# Patient Record
Sex: Female | Born: 1950
Health system: Southern US, Community
[De-identification: ages and names within clinical notes are randomized; demographics above are authoritative.]

## PROBLEM LIST (undated history)

## (undated) DIAGNOSIS — G4700 Insomnia, unspecified: Secondary | ICD-10-CM

## (undated) DIAGNOSIS — Z9981 Dependence on supplemental oxygen: Secondary | ICD-10-CM

## (undated) DIAGNOSIS — S14105A Unspecified injury at C5 level of cervical spinal cord, initial encounter: Secondary | ICD-10-CM

## (undated) DIAGNOSIS — J189 Pneumonia, unspecified organism: Secondary | ICD-10-CM

## (undated) DIAGNOSIS — G8250 Quadriplegia, unspecified: Secondary | ICD-10-CM

## (undated) DIAGNOSIS — L8990 Pressure ulcer of unspecified site, unspecified stage: Secondary | ICD-10-CM

## (undated) DIAGNOSIS — J9601 Acute respiratory failure with hypoxia: Secondary | ICD-10-CM

## (undated) DIAGNOSIS — F419 Anxiety disorder, unspecified: Secondary | ICD-10-CM

## (undated) HISTORY — PX: POSTERIOR FUSION CERVICAL SPINE: SUR628

## (undated) HISTORY — PX: CHOLECYSTECTOMY: SHX55

## (undated) HISTORY — PX: TONSILLECTOMY: SUR1361

## (undated) HISTORY — PX: VAGINAL HYSTERECTOMY: SUR661

## (undated) HISTORY — DX: Insomnia, unspecified: G47.00

## (undated) HISTORY — DX: Anxiety disorder, unspecified: F41.9

---

## 1984-06-22 DIAGNOSIS — S14105A Unspecified injury at C5 level of cervical spinal cord, initial encounter: Secondary | ICD-10-CM

## 1984-06-22 DIAGNOSIS — G8250 Quadriplegia, unspecified: Secondary | ICD-10-CM

## 1984-06-22 HISTORY — DX: Quadriplegia, unspecified: G82.50

## 1984-06-22 HISTORY — DX: Unspecified injury at C5 level of cervical spinal cord, initial encounter: S14.105A

## 2001-04-25 ENCOUNTER — Other Ambulatory Visit: Admission: RE | Admit: 2001-04-25 | Discharge: 2001-04-25 | Payer: Self-pay | Admitting: Dermatology

## 2003-02-27 ENCOUNTER — Inpatient Hospital Stay (HOSPITAL_COMMUNITY): Admission: AD | Admit: 2003-02-27 | Discharge: 2003-03-09 | Payer: Self-pay | Admitting: Pulmonary Disease

## 2003-08-01 ENCOUNTER — Ambulatory Visit (HOSPITAL_COMMUNITY): Admission: RE | Admit: 2003-08-01 | Discharge: 2003-08-01 | Payer: Self-pay | Admitting: Pulmonary Disease

## 2004-02-01 ENCOUNTER — Ambulatory Visit (HOSPITAL_COMMUNITY): Admission: RE | Admit: 2004-02-01 | Discharge: 2004-02-01 | Payer: Self-pay | Admitting: Pulmonary Disease

## 2004-05-28 ENCOUNTER — Ambulatory Visit (HOSPITAL_COMMUNITY): Admission: RE | Admit: 2004-05-28 | Discharge: 2004-05-28 | Payer: Self-pay | Admitting: Pulmonary Disease

## 2004-12-26 ENCOUNTER — Ambulatory Visit (HOSPITAL_COMMUNITY): Admission: RE | Admit: 2004-12-26 | Discharge: 2004-12-26 | Payer: Self-pay | Admitting: Pulmonary Disease

## 2005-08-06 ENCOUNTER — Ambulatory Visit (HOSPITAL_COMMUNITY): Admission: RE | Admit: 2005-08-06 | Discharge: 2005-08-06 | Payer: Self-pay | Admitting: Pulmonary Disease

## 2005-11-30 ENCOUNTER — Ambulatory Visit (HOSPITAL_COMMUNITY): Admission: RE | Admit: 2005-11-30 | Discharge: 2005-11-30 | Payer: Self-pay | Admitting: Pulmonary Disease

## 2006-01-22 ENCOUNTER — Ambulatory Visit: Payer: Self-pay | Admitting: Internal Medicine

## 2006-01-22 ENCOUNTER — Ambulatory Visit (HOSPITAL_COMMUNITY): Admission: RE | Admit: 2006-01-22 | Discharge: 2006-01-22 | Payer: Self-pay | Admitting: Internal Medicine

## 2006-01-22 HISTORY — PX: COLONOSCOPY: SHX174

## 2007-03-18 ENCOUNTER — Inpatient Hospital Stay (HOSPITAL_COMMUNITY): Admission: AD | Admit: 2007-03-18 | Discharge: 2007-03-23 | Payer: Self-pay | Admitting: Pulmonary Disease

## 2010-03-26 ENCOUNTER — Encounter
Admission: RE | Admit: 2010-03-26 | Discharge: 2010-06-09 | Payer: Self-pay | Source: Home / Self Care | Attending: Pulmonary Disease | Admitting: Pulmonary Disease

## 2010-09-10 ENCOUNTER — Ambulatory Visit (HOSPITAL_COMMUNITY)
Admission: RE | Admit: 2010-09-10 | Discharge: 2010-09-10 | Disposition: A | Discharge status: Home / Self Care | Payer: BLUE CROSS/BLUE SHIELD | Source: Ambulatory Visit | Attending: Pulmonary Disease | Admitting: Pulmonary Disease

## 2010-09-10 DIAGNOSIS — IMO0001 Reserved for inherently not codable concepts without codable children: Secondary | ICD-10-CM | POA: Insufficient documentation

## 2010-09-10 DIAGNOSIS — G8250 Quadriplegia, unspecified: Secondary | ICD-10-CM | POA: Insufficient documentation

## 2010-09-10 DIAGNOSIS — M62838 Other muscle spasm: Secondary | ICD-10-CM | POA: Insufficient documentation

## 2010-11-04 NOTE — Discharge Summary (Signed)
NAME:  [name] Andersen, [name] Andersen                 ACCOUNT NO.:  [id number]   MEDICAL RECORD NO.:  [id number]          PATIENT TYPE:  INP   LOCATION:  A222                          FACILITY:  [facility]   PHYSICIAN:  Edward L. Juanetta Gosling, M.D.DATE OF BIRTH:  [birth year]   DATE OF ADMISSION:  03/18/2007  DATE OF DISCHARGE:  10/01/2008LH                               DISCHARGE SUMMARY   FINAL DISCHARGE DIAGNOSES:  1. Acute bronchitis.  2. Respiratory failure, multifactorial.  3. Quadriparesis related to a C-spine injury.  4. Muscle spasm related to her C-spine injury.  5. Decubitus on the sacral area.   HISTORY:  [name] Andersen is a 60 year old who was in her usual state of fair  health at home when she developed cough and congestion 24 hours prior to  admission.  She only lives about 2 houses from my office, and because it  is difficult for her to get up, I went to her home  and saw her. She was  clearly very congested and looked quite sick.  I was concerned that she  had pneumonia.  Her husband had tried some vibratory percussion which  had not helped, so I directly admitted her to the hospital at that  point.  She has had worsening congestion, and normally she can get up  and move in her wheelchair with help, but today she has not been able to  get out of the bed.   PAST MEDICAL HISTORY:  Is positive for a cervical spine injury that has  left her with a quadriparesis. She is able to use her arms some. She has  a motorized wheelchair and does fairly well at home and has 24-hour  care.   PHYSICAL EXAMINATION:  GENERAL:  A thin female who had  bubbling  respirations even after a nebulizer treatment, marked wheezing, and she  looked quite sick.  She was very weak and thin.  HEENT:  Mucous membranes were moist.  Pupils were reactive.  Nose and  throat were clear.  CHEST:  Showed rhonchi and rales.  HEART:  Regular without a gallop.  EXTREMITIES:  Showed no edema, but she did have a decubitus on her   sacrum.   White count was 8500 with 83% neutrophils, platelets 129, hemoglobin  12.9. Her blood gas showed a pH of 7.31, pCO2 of 56, pO2 of 195. This on  100%. Her blood cultures negative.   Chest x-ray did not show pneumonia.   HOSPITAL COURSE:  She was admitted, felt to have severe bronchitis,  respiratory failure, multifactorial, probably mostly from her muscle  weakness and lack of diaphragmatic function.  She did have a decubitus  ulcer as well.   She was started on Xopenex. Levaquin. Mucinex. and slowly improved.  By  the time of discharge. she was improved enough for discharge home,  although she still had some congestion.   She is discharged home on:  1. Mucinex 2 tablets twice a day.  2. Levaquin 500 mg daily x7 more days.  3. Temazepam 15 mg at bedtime with a repeat if needed.  4. Diazepam 5  mg b.i.d.  5. Gabapentin 600 mg three times a day.  6. Baclofen 20 mg 2 tablets three times a day.  7. Xanax on a p.r.n. basis.  8. She is going to have a nebulizer with Xopenex, low-dose, three      times a day at home, and I will have home health see her as well.  9. Her O2 saturation was 81% on room air today, so she is going to be      on some oxygen as well.  10.She will also be on Tussionex 5 mL q. 12 h p.r.n. cough.   I will also have home health help her with her sacral decubitus.      Edward L. Juanetta Gosling, M.D.  Electronically Signed     ELH/MEDQ  D:  03/23/2007  T:  03/23/2007  Job:  [id number]

## 2010-11-04 NOTE — Group Therapy Note (Signed)
NAME:  [name] Andersen, [name] Andersen                 ACCOUNT NO.:  [id number]   MEDICAL RECORD NO.:  [id number]          PATIENT TYPE:  INP   LOCATION:  A222                          FACILITY:  [facility]   PHYSICIAN:  Edward L. Juanetta Gosling, M.D.DATE OF BIRTH:  [birth year]   DATE OF PROCEDURE:  03/20/2007  DATE OF DISCHARGE:                                 PROGRESS NOTE   [name] seems to be better than she was.  She has no new complaints.  She says that her chest is clearer but still very congested.   Her exam shows temperature is 99.8, pulse 110, respirations 22, blood  pressure 95, O2 saturation 91% on 3 liters.  She says she thinks she has  a new skin lesion. I cannot tell if this is new, but it does have a  DuoDerm over it, and I did not remove the DuoDerm at this point since it  has just been placed, and I am afraid I am going to tear her skin.  Her  chest shows significant rhonchi.  Her heart is regular.  Her abdomen is  soft.   ASSESSMENT:  She seems to be better, still sick, of course.   PLAN:  Continue her medications. No changes in her treatment.  She says  is not eating well.  I told her she could have food from home.      Edward L. Juanetta Gosling, M.D.  Electronically Signed     ELH/MEDQ  D:  03/20/2007  T:  03/20/2007  Job:  [id number]

## 2010-11-04 NOTE — Group Therapy Note (Signed)
NAME:  [name] Andersen, [name] Andersen                 ACCOUNT NO.:  [id number]   MEDICAL RECORD NO.:  [id number]          PATIENT TYPE:  INP   LOCATION:  A222                          FACILITY:  [facility]   PHYSICIAN:  Edward L. Juanetta Gosling, M.D.DATE OF BIRTH:  09-08-1950   DATE OF PROCEDURE:  03/23/2007  DATE OF DISCHARGE:                                 PROGRESS NOTE   [name] Andersen says she had a bad night last night with not being able to  sleep, problems with tightening of medications, etc.  Otherwise, she  feels better as far as her chest is concerned, and she wants to go home.   Her physical exam shows that her temperature is 97, pulse 101,  respirations 22, blood pressure 99/63, O2 saturation 93%. She, of  course, has limited use for arms and is paralyzed from about the arms  down.  She did have a bowel movement yesterday.  She is not very hungry  but otherwise I think is better.  Her chest is clearer; it is not clear.   ASSESSMENT:  She is better.   PLAN:  I am hopeful she will be able to be discharged later today.  I am  going to go ahead and have her get an O2 saturation on room air to make  sure she is okay with that. She is going to need a nebulizer. She will  need home health services.  I am going to get her a dose of Levaquin  before she goes.  Please see Discharge Summary for details.      Edward L. Juanetta Gosling, M.D.  Electronically Signed     ELH/MEDQ  D:  03/23/2007  T:  03/23/2007  Job:  [id number]

## 2010-11-04 NOTE — H&P (Signed)
NAME:  [name] Andersen, CASASOLA                 ACCOUNT NO.:  [id number]   MEDICAL RECORD NO.:  [id number]          PATIENT TYPE:  INP   LOCATION:  A222                          FACILITY:  [facility]   PHYSICIAN:  Edward L. Juanetta Gosling, M.D.DATE OF BIRTH:  08-Apr-1951   DATE OF ADMISSION:  03/18/2007  DATE OF DISCHARGE:  [facility]                              HISTORY & PHYSICAL   REASON FOR ADMISSION:  Shortness of breath.   HISTORY:  Ms. Schussler is a 60 year old who had developed cough and  congestion about 24 hours ago.  She lives about two houses from my  office so I went by to see her at her home, and she was clearly very  congested.  Her husband had tried some vibropercussion which has not  helped.  She has not had any definite fever but she may have fever.  She  has gotten increasingly weak and has had worsening congestion today.  Because of her severe congestion and the fact that she has  quadriparesis, although she has some use of her arms,  she was admitted  the hospital.   Her past medical history is positive for cholecystectomy, a cervical  spine injury that has left her with quadriparesis.  She is able to use  her arms some.  She is generally in a wheelchair.  She has a motorized  wheelchair and does fairly well at home.  She does have 24-hour care.   FAMILY HISTORY:  Is positive for pancreatic cancer and vascular disease  as well as history of depression.   SOCIAL HISTORY:  She lives with her husband.  She does not smoke, does  not use any illicit drugs, does not use any alcohol to any great extent.   REVIEW OF SYSTEMS:  She denies any problems with her urine right now.  She does have difficulty with sleep.  She has spasms frequently and she  has had a large area of essentially a decubitus on her sacral area.   PHYSICAL EXAMINATION NOW:  Shows a thin female who has bubbling  respirations, even after a nebulizer treatment.  Her temperature is  98.5, pulse 81, respirations 16, blood pressure  112/75.  She looks very  weak.  She is very thin.  Her mucous membranes are moist.  Her pupils do  react.  Her nose and throat are clear.  She is 64 inches tall height,  weight 41.6 kg.  CHEST:  Shows marked rhonchi, some rales.  HEART:  Regular without gallop.  ABDOMEN:  Soft.  Bowel sounds are present and active.  EXTREMITIES:  Showed no edema. She does have a decubitus on her sacrum.  She has some wasting of the extremities.  She has about a C5 or 6 level  sensation.   LABORATORY WORK:  White count is 8500 with 83% neutrophils, platelets  129, hemoglobin is 12.9.  On 100% O2, pH 7.31, pCO2 of 56, pO2 195.  She  is now on a 50% mask.  Her electrolytes are normal.  BUN 11, creatinine  0.32, glucose 114, potassium is 3.8, albumin 3.5.  Blood cultures,  of  course, are pending.  Her chest x-ray does not show pneumonia, which is  somewhat surprising to me.  When I saw her at her home I certainly  thought she had pneumonia.   ASSESSMENT:  She has severe bronchitis.  She has some respiratory  failure.  I am not sure how much of this is chronic.  She has a sacral  decubitus.  She has quadriparesis.   Plan is to go ahead and admit her, start her on Levaquin, put in a Foley  catheter, give her some fluids and Xopenex treatments for her  secretions, Mucinex for secretions.  She may have to have some Mucomyst  added.  The Xopenex I have put on the low dose because she has very  little tolerance for these.  We will continue with her treatments and  follow from there.      Edward L. Juanetta Gosling, M.D.  Electronically Signed     ELH/MEDQ  D:  03/18/2007  T:  03/19/2007  Job:  [id number]

## 2010-11-04 NOTE — Group Therapy Note (Signed)
NAME:  [name] Andersen, [name] Andersen                 ACCOUNT NO.:  [id number]   MEDICAL RECORD NO.:  [id number]          PATIENT TYPE:  INP   LOCATION:  A222                          FACILITY:  [facility]   PHYSICIAN:  Edward L. Juanetta Gosling, M.D.DATE OF BIRTH:  01-27-1951   DATE OF PROCEDURE:  DATE OF DISCHARGE:                                 PROGRESS NOTE   PROBLEM:  Bronchitis, quadriparesis.   SUBJECTIVE:  [name] Andersen says she is doing okay.  She needs to have a  bowel movement today and we are going to give her suppository.  At home,  she and her husband use a digital stimulation.  I think the suppository  will be about the same.  She is feeling better but still very congested.   EXAM:  Shows temperature is 97.7, pulse 106, respirations 18, blood  pressure 85/50, O2 sats 94% on 4L.  Her chest is clearing.  She still has some rhonchi.  Her heart is regular.   ASSESSMENT:  She is better.   PLAN:  Continue her treatments probably another 24 hours.  I am hopeful  that she will be able to be discharged maybe tomorrow.  We will plan to  continue with other treatments and follow.      Edward L. Juanetta Gosling, M.D.  Electronically Signed     ELH/MEDQ  D:  03/22/2007  T:  03/22/2007  Job:  [id number]
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
## 2010-11-07 NOTE — Discharge Summary (Signed)
   NAME:  [name] Andersen, [name] Andersen                           ACCOUNT NO.:  [id number]   MEDICAL RECORD NO.:  [id number]                   PATIENT TYPE:  INP   LOCATION:  A338                                 FACILITY:  [facility]   PHYSICIAN:  Edward L. Juanetta Gosling, M.D.             DATE OF BIRTH:  [birth year]   DATE OF ADMISSION:  02/27/2003  DATE OF DISCHARGE:                                 DISCHARGE SUMMARY   FINAL DISCHARGE DIAGNOSES:  1. Pneumonia.  2. Quadriplegia from cervical spine injury.  3. Perineal yeast infection.   HISTORY:  Ms. Lover is a 60 year old who had a cervical spine injury some  years ago and who now has limited use of her arms and poor diaphragmatic  function.  She is wheelchair bound.  She was in her usual state of fairly  poor health at home when she developed a cough a congestion, which  progressed despite treatment.  She came to my office on the day of admission  and clearly was not moving her secretions well and was admitted to the  hospital because of that.  Her physical examination showed that her blood  pressure was about 80/60, pulse 100.  She had blood cultures which were  negative.  She had a normal white blood cell count.  Chest x-ray showed what  appeared to be a pneumonia.   HOSPITAL COURSE:  She was treated with intravenous antibiotics and improved  slowly.  She had difficulty moving her secretions.  She was placed on BiPAP,  which helped, and then had productive cough.  She continued her slow  improvement and by the time she was ready for discharge, her chest was much  clearer.   DISCHARGE MEDICATIONS:  She is discharged home on:  1. Ceftin 250 mg b.i.d. x7 more days.  2. Diflucan 100 mg daily for 10 days.  3. Humibid LA 1 b.i.d.  4. Nebulizer treatments with Xopenex and Atrovent and she is also going to     have a manual percussion device at home.      ___________________________________________                                            Oneal Deputy.  Juanetta Gosling, M.D.   ELH/MEDQ  D:  03/09/2003  T:  03/10/2003  Job:  [id number]

## 2010-11-07 NOTE — Group Therapy Note (Signed)
   NAME:  [name] Andersen, MENTOR                           ACCOUNT NO.:  [id number]   MEDICAL RECORD NO.:  [id number]                   PATIENT TYPE:  INP   LOCATION:  A223                                 FACILITY:  [facility]   PHYSICIAN:  Edward L. Juanetta Gosling, M.D.             DATE OF BIRTH:  January 10, 1951   DATE OF PROCEDURE:  03/05/2003  DATE OF DISCHARGE:                                   PROGRESS NOTE   PROBLEM:  Pneumonia, quadriplegia.   SUBJECTIVE:  [name] says that she is feeling a little better.  She is  tolerating the BiPAP fairly well so far.  She still has a lot of congestion  in her chest, but it is no as bad as it has been.  She has no other new  complaints now.   PHYSICAL EXAMINATION:  VITAL SIGNS:  Blood pressure 95/54, pulse 72,  respirations 22, temperature 97.7.  CHEST:  Clearer than it has been.  HEART:  Regular.   ASSESSMENT:  She has pneumonia and quadriplegia.  She has had low blood  pressures and low pulse rates.  This is due to the phenomenon known as  dysreflexia that occurs in patients with quadriplegia.  She is slowly  improving, but it clearly no ready for discharge yet.  I will plan to  continue with fluid, antibiotics, etc.  No changes today.                                                 Edward L. Juanetta Gosling, M.D.    ELH/MEDQ  D:  03/05/2003  T:  03/06/2003  Job:  [id number]

## 2010-11-07 NOTE — Group Therapy Note (Signed)
   NAME:  [name] Andersen, [name] Andersen                           ACCOUNT NO.:  [id number]   MEDICAL RECORD NO.:  [id number]                   PATIENT TYPE:  INP   LOCATION:  A223                                 FACILITY:  [facility]   PHYSICIAN:  Edward L. Juanetta Gosling, M.D.             DATE OF BIRTH:  04/21/1951   DATE OF PROCEDURE:  DATE OF DISCHARGE:                                   PROGRESS NOTE   PROBLEMS:  Pneumonia, quadriplegia.   SUBJECTIVE:  [name] Andersen says that she is feeling okay.  She had a pretty  good night last night but did wake up covered with sweat which sometimes is  a sign that she is having some other problem.  This has been investigated.  However, we really did not find anything.  She is otherwise doing okay.  Says that she is breathing better.  She is able to cough up some sputum now.   OBJECTIVE:  VITAL SIGNS:  Temperature 97.3, pulse 89, respirations 20, blood  pressure 90/60, O2 saturation 97% on room air.  CHEST:  Much clearer than it has been.  HEART:  Regular.  EXTREMITIES:  Show no edema.   ASSESSMENT:  She is improving.   PLAN:  Get her up and moving around a little more.  Her IV is out, so she is  on p.o. medications at this point.                                               Edward L. Juanetta Gosling, M.D.    ELH/MEDQ  D:  03/07/2003  T:  03/07/2003  Job:  [id number]

## 2010-11-07 NOTE — Group Therapy Note (Signed)
   NAME:  [name] Andersen, [name] Andersen                           ACCOUNT NO.:  [id number]   MEDICAL RECORD NO.:  [id number]                   PATIENT TYPE:  INP   LOCATION:  A223                                 FACILITY:  [facility]   PHYSICIAN:  Edward L. Juanetta Gosling, M.D.             DATE OF BIRTH:  [birth year]   DATE OF PROCEDURE:  03/06/2003  DATE OF DISCHARGE:                                   PROGRESS NOTE   PROBLEM LIST:  1. Pneumonia.  2. Quadriplegia.   SUBJECTIVE:  [name] says she is doing okay and has no new complaints.  She does feel like the BiPAP mask does not fit her well and she is not going  to be able to tolerate it if we cannot find another mask.  She sat up for  awhile yesterday and probably had some dysautonomia with feeling of low  blood pressure so I am going to ask her to get a little bit more when her  husband is around because we want to try and stabilize her autonomic nervous  system the best we can.  Otherwise, she says she is breathing better.  She  did cough up some sputum today.   OBJECTIVE:  VITAL SIGNS:  Temperature 97.7, pulse 81, respirations 22, blood  pressure 93/64, O2 saturations 95% on 2 L.  CHEST:  Shows rhonchi bilaterally, but is clearer than it has been.   ASSESSMENT:  She is slowly improving.   PLAN:  Continue with treatments.  We are going to see if we can get another  mask that might fit her better.  If not, I think we will just discontinue  the BiPAP since she is not tolerating it and she is improving.                                               Edward L. Juanetta Gosling, M.D.    ELH/MEDQ  D:  03/06/2003  T:  03/06/2003  Job:  [id number]

## 2010-11-07 NOTE — Group Therapy Note (Signed)
   NAME:  [name] Andersen, [name] Andersen                           ACCOUNT NO.:  [id number]   MEDICAL RECORD NO.:  [id number]                   PATIENT TYPE:  INP   LOCATION:  A223                                 FACILITY:  [facility]   PHYSICIAN:  Edward L. Juanetta Gosling, M.D.             DATE OF BIRTH:  [birth year]   DATE OF PROCEDURE:  03/01/2003  DATE OF DISCHARGE:                                   PROGRESS NOTE   SUBJECTIVE:  [name] says that she had an episode of feeling anxious last  night; it is not really clear exactly what happened.  She is feeling a  little better this morning.  She never has coughed anything up but says that  she is overall better.   OBJECTIVE:  Her exam today shows that her temperature is 99.3, pulse 60,  respirations 20, blood pressure 99/52, O2 saturation 93% now on 3 liters.  Her chest still shows some rhonchi bilaterally but clearer than it was, I&O  is -100.  Her IV came out last night.  I would like her to get another at  least 24 hours of IV antibiotics and preferably be able to give her another  dose tomorrow.  I am going to ask the nursing staff to try one more time to  see if we can get intravenous access.                                               Edward L. Juanetta Gosling, M.D.    ELH/MEDQ  D:  03/01/2003  T:  03/01/2003  Job:  [id number]

## 2010-11-07 NOTE — Group Therapy Note (Signed)
   NAME:  [name] Andersen, [name]                           ACCOUNT NO.:  [id number]   MEDICAL RECORD NO.:  [id number]                   PATIENT TYPE:  INP   LOCATION:  A338                                 FACILITY:  [facility]   PHYSICIAN:  Edward L. Juanetta Gosling, M.D.             DATE OF BIRTH:  August 27, 1950   DATE OF PROCEDURE:  03/09/2003  DATE OF DISCHARGE:                                   PROGRESS NOTE   PROBLEM LIST:  1. Pneumonia.  2. Quadriplegia.  3. Perineal yeast infection.   SUBJECTIVE:  [name] Andersen is a 60 year old who had a cervical spine injury  some years ago and has been left quadriplegic since.  She does have some  limited use of her arms.  She was in her usual state of fair health at home  when she developed increasing shortness of breath; she developed a cough,  congestion, and thought she had a cold, requested treatment, and we gave her  an antibiotic and gave her a decongestant but she seemed to not tolerate  those particularly well and seemed to get worse.  She came to my office on  the day of admission and was clearly quite congested, not really able to  move her secretions, and she was admitted to the hospital because of this.  She has had a fairly stormy course through the hospitalization thus far but  seems to be doing much better and today says she feels like she is ready to  go home.   OBJECTIVE:  Her exam shows that she has a fairly marked perineal infection.  Her chest is much clearer, she is afebrile, and overall seems to have  improved.   ASSESSMENT:  She is better.   PLAN:  Discharge her home today.                                               Edward L. Juanetta Gosling, M.D.    ELH/MEDQ  D:  03/09/2003  T:  03/09/2003  Job:  [id number]
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
## 2010-11-07 NOTE — Op Note (Signed)
NAME:  [name] Andersen, [name] Andersen                 ACCOUNT NO.:  [id number]   MEDICAL RECORD NO.:  [id number]          PATIENT TYPE:  AMB   LOCATION:  DAY                           FACILITY:  [facility]   PHYSICIAN:  Lionel December, M.D.    DATE OF BIRTH:  19-Feb-1951   DATE OF PROCEDURE:  01/22/2006  DATE OF DISCHARGE:                                 OPERATIVE REPORT   PROCEDURE:  Colonoscopy.   INDICATIONS:  [name] is s 60 year old Caucasian female who is undergoing high-  risk screening colonoscopy.  Her mother had surgery for colon carcinoma at  age 66 or 71.  The patient has neurogenic bowel secondary to paraplegia.  She is using a suppository every 3 days but rarely has good evacuation.  The  procedure was reviewed with the patient, informed signed was obtained.   MEDS FOR CONSCIOUS SEDATION:  Demerol 20 mg IV, Versed 1 mg IV.   FINDINGS:  The procedure performed in endoscopy suite.  The patient's vital  signs and O2 sat were monitored during the procedure and remained stable.  The patient was placed in the left lateral decubitus position, rectal  examination performed.  The sphincter tone was normal.  Digital exam was  also normal.  The Olympus videoscope was placed in the rectum and advanced  under vision in the sigmoid colon and beyond.  There was some stool at the  rectosigmoid junction, otherwise the prep was excellent.  The scope was  easily passed through the hepatic flexure which was tortuous.  Using  abdominal pressure and different positions, I was able to advance the scope  into the ascending colon and cecum.  The cecum was identified by the  appendiceal orifice and ileocecal valve.  A short segment of TI was also  examined and was normal.  As the scope was withdrawn, the colonic mucosa was  examined for the second time and there were no polyps and/or tumor masses.  The rectal mucosa was normal.  The scope was retroflexed to examine the  anorectal junction and hemorrhoids noted below the  dentate line.  The  endoscope was withdrawn and withdrawn.  The patient tolerated the procedure  well.   FINAL DIAGNOSIS:  Normal colonoscopy except external hemorrhoids.   RECOMMENDATIONS:  She will resume her usual medicines and high-fiber diet.  I would like for her to try GlycoLax 17 grams the night before she takes a  suppository and hopefully it would improve her colonic evacuation.  She may  want to titrate the dose and hopefully can find the optimal dose.   She may consider next screening exam in 5 years from now.      Lionel December, M.D.  Electronically Signed    NR/MEDQ  D:  01/22/2006  T:  01/22/2006  Job:  [id number]

## 2010-11-07 NOTE — Group Therapy Note (Signed)
   NAME:  [name] Andersen, [name] Andersen                           ACCOUNT NO.:  [id number]   MEDICAL RECORD NO.:  [id number]                   PATIENT TYPE:  INP   LOCATION:  A223                                 FACILITY:  [facility]   PHYSICIAN:  Edward L. Juanetta Gosling, M.D.             DATE OF BIRTH:  26-Feb-1951   DATE OF PROCEDURE:  03/02/2003  DATE OF DISCHARGE:                                   PROGRESS NOTE   PROBLEMS:  1. Pneumonia.  2. Quadriplegia.   SUBJECTIVE:  [name] says that she had a bad night last night.  She has  felt agitated and had trouble breathing.  She has been refusing her  respiratory treatments and I have told her today that we need to go ahead  with those.  She is otherwise doing about the same.   OBJECTIVE:  Her exam this morning shows that she is very congested, much  more so even than on admission.  Her O2 saturation is in the 90s, however,  and her respiratory rate is about 20 but she is taking very shallow  respirations which is as always because of her quadriplegia.  I have  discussed this with she and her husband at some length and the plan now is  to add nebulizer treatments even though she has had some nausea and  agitation from those.  I am going to pretreat her with Xanax because of the  problems that she has had with the nausea.  I am going to go ahead with  Mucomyst, Xopenex, and Atrovent treatments and see if it makes a difference.  I am hopeful that she will be able to tolerate this.  I did discuss with the  family and with [name] that she may end up having to be intubated for  secretion control if for no other reason, but I do not want to do that  unless we have to.  I have discussed all this at length with her.                                               Edward L. Juanetta Gosling, M.D.    ELH/MEDQ  D:  03/02/2003  T:  03/02/2003  Job:  [id number]

## 2010-11-07 NOTE — H&P (Signed)
NAME:  CAROLLE, ISHII                           ACCOUNT NO.:  [id number]   MEDICAL RECORD NO.:  [id number]                   PATIENT TYPE:  INP   LOCATION:  A223                                 FACILITY:  [facility]   PHYSICIAN:  Edward L. Juanetta Gosling, M.D.             DATE OF BIRTH:  [birth year]   DATE OF ADMISSION:  02/27/2003  DATE OF DISCHARGE:                                HISTORY & PHYSICAL   REASON FOR ADMISSION:  Bronchial pneumonia.   HISTORY:  Ms. Poorman is a 60 year old who has developed a cough with  congestion about a week ago, was treated with Zithromax and a decongestant  and developed increasing problems.  She has become short of breath, she is  having difficulty lying down.  She is not really able to cough anything up.  Her husband has been attempting some vibropercussion which has not worked.  She says that she is still having a lot of congestion.  She has not had any  fever.   PAST MEDICAL HISTORY:  Positive for:  1. Cholecystectomy.  2. Five to seven years ago, she had a cervical spine injury that has left     her with quadriparesis with almost complete paralysis, with complete     paralysis of the legs and some use of her arms.  She is generally in a     wheelchair.   FAMILY HISTORY:  Positive for pancreatic cancer and vascular disease.   SOCIAL HISTORY:  She lives at home with her husband.  She does not smoke.  She does not drink any alcohol.   REVIEW OF SYSTEMS:  Otherwise essentially negative.   PHYSICAL EXAMINATION:  VITAL SIGNS:  Show her temperature is 99,  respirations 32, pulse 52, blood pressure 96/62.  GENERAL:  She appears dyspneic at rest.  HEENT:  Pupils are reactive to light and accomodation.  Nose and throat are  clear.  NECK:  Supple without masses.  She has had some sort of surgery on her neck  to stabilize her neck.  CHEST:  Shows decreased breath sounds and some rhonchi, fairly poor  respiratory excursion.  HEART:  Regular.  ABDOMEN:   Soft.  EXTREMITIES:  Showed no edema.  She has flaccid paralysis to the lower  extremities.   ASSESSMENT:  I think she has a bronchopneumonia.    PLAN:  Admit for  IV treatments.  I am concerned that she will have  increasing respiratory failure because of her inability to clear her  secretions, if we do not go ahead and get her in the hospital now.                                               Edward L. Juanetta Gosling, M.D.    ELH/MEDQ  D:  02/27/2003  T:  02/27/2003  Job:  [id number]

## 2010-11-07 NOTE — Group Therapy Note (Signed)
   NAME:  JAONNA, WORD                           ACCOUNT NO.:  [id number]   MEDICAL RECORD NO.:  [id number]                   PATIENT TYPE:  INP   LOCATION:  A223                                 FACILITY:  [facility]   PHYSICIAN:  Edward L. Juanetta Gosling, M.D.             DATE OF BIRTH:  [birth year]   DATE OF PROCEDURE:  03/08/2003  DATE OF DISCHARGE:                                   PROGRESS NOTE   PROBLEM LIST:  1. Pneumonia.  2. Quadriplegia.   SUBJECTIVE:  Ms. Odekirk is doing better in general.  She continues to be  somewhat short of breath and she has had a very slow recovery from this -  which is not unexpected considering her quadriplegia and minimal if any  diaphragmatic function.   OBJECTIVE:  Her physical exam today shows her temperature is 97.1, pulse 76,  respirations 18, blood pressure 104/62.  Chest is clearer.  Her heart is  regular.  She continues to have some episodes of what are probably a  dysautonomia or dysreflexia with drop in heart rate, drop in blood pressure,  but overall she seems to be doing better.  We discussed discharge planning  today.  She will need some home health and we are working all this out.  She  will probably be able to be discharged tomorrow.                                               Edward L. Juanetta Gosling, M.D.    ELH/MEDQ  D:  03/08/2003  T:  03/08/2003  Job:  [id number]

## 2010-11-07 NOTE — Group Therapy Note (Signed)
   NAME:  [name] Andersen, [name] Andersen                           ACCOUNT NO.:  [id number]   MEDICAL RECORD NO.:  [id number]                   PATIENT TYPE:  INP   LOCATION:  A223                                 FACILITY:  [facility]   PHYSICIAN:  Edward L. Juanetta Gosling, M.D.             DATE OF BIRTH:  01/05/1951   DATE OF PROCEDURE:  02/28/2003  DATE OF DISCHARGE:                                   PROGRESS NOTE   PROBLEM LIST:  Bronchopneumonia.   SUBJECTIVE:  [name] Andersen is still not really able to cough up anything.  She  had some problem with albuterol and a little problem, but less with Xopenex  causing her to be nauseated.  She says she does not feel as well as she  would like and is still shortness of breath.   OBJECTIVE:  VITAL SIGNS:  Temperature 96.9, pulse 74, respirations 22, blood  pressure 104/71, O2 saturations 90% on 2 L.  LUNGS:  Her respiratory effort is fairly poor.  She is not moving much air.  I&O is -10.  CHEST:  Marked bilateral rhonchi and she is having a lot of difficulty in  getting that up.   ASSESSMENT:  She is quadriplegic with probable pneumonia, a very difficult  situation with her poor cough effort.   PLAN:  I am going to hold off on the Xopenex for now, but if she is not  improving this evening I am going to have to restart that and I have told  her that she will just end up unfortunately having to be nauseated.  We are  working with postural drainage trying to get her to cough something up.  She  is on antibiotics and at this point, I do not think there is much else to  do.                                               Edward L. Juanetta Gosling, M.D.    ELH/MEDQ  D:  02/28/2003  T:  02/28/2003  Job:  [id number]

## 2010-11-07 NOTE — Group Therapy Note (Signed)
   NAME:  [name] Andersen, [name] Andersen                           ACCOUNT NO.:  [id number]   MEDICAL RECORD NO.:  [id number]                   PATIENT TYPE:  INP   LOCATION:  A223                                 FACILITY:  [facility]   PHYSICIAN:  Angus G. Renard Matter, M.D.              DATE OF BIRTH:  [birth year]   DATE OF PROCEDURE:  03/03/2003  DATE OF DISCHARGE:                                   PROGRESS NOTE   SUBJECTIVE:  This patient is quadriplegic, was admitted with pneumonia.  Has  been receiving IV Rocephin and Zithromax, Mucomyst.  The patient remains  afebrile.   OBJECTIVE:  Vital signs:  Blood pressure 99/59, respirations 18, pulse 75,  temperature 98.1.  Lungs:  Rhonchi over lower lung field.  Heart:  Regular  rhythm.  Neurologic:  The patient is quadriplegic.   ASSESSMENT:  The patient was admitted with bronchopneumonia.   PLAN:  Continue Mucomyst, Atrovent, Xopenex.  Continue IV antibiotic.                                               Angus G. Renard Matter, M.D.    AGM/MEDQ  D:  03/03/2003  T:  03/03/2003  Job:  [id number]

## 2010-11-18 ENCOUNTER — Other Ambulatory Visit (HOSPITAL_COMMUNITY): Payer: Self-pay | Admitting: Pulmonary Disease

## 2010-11-18 ENCOUNTER — Ambulatory Visit (HOSPITAL_COMMUNITY)
Admission: RE | Admit: 2010-11-18 | Discharge: 2010-11-18 | Disposition: A | Discharge status: Home / Self Care | Payer: BC Managed Care – PPO | Source: Ambulatory Visit | Attending: Pulmonary Disease | Admitting: Pulmonary Disease

## 2010-11-18 DIAGNOSIS — M546 Pain in thoracic spine: Secondary | ICD-10-CM | POA: Insufficient documentation

## 2010-11-18 DIAGNOSIS — M5380 Other specified dorsopathies, site unspecified: Secondary | ICD-10-CM | POA: Insufficient documentation

## 2010-11-18 DIAGNOSIS — M549 Dorsalgia, unspecified: Secondary | ICD-10-CM

## 2011-01-28 ENCOUNTER — Encounter (INDEPENDENT_AMBULATORY_CARE_PROVIDER_SITE_OTHER): Payer: Self-pay | Admitting: *Deleted

## 2011-04-02 LAB — CULTURE, BLOOD (ROUTINE X 2)
Culture: NO GROWTH
Culture: NO GROWTH
Report Status: 10012008
Report Status: 10012008

## 2011-04-02 LAB — BLOOD GAS, ARTERIAL
Acid-Base Excess: 2.7 — ABNORMAL HIGH
Bicarbonate: 28.3 — ABNORMAL HIGH
FIO2: 1
O2 Saturation: 99.2
Patient temperature: 37
TCO2: 25.8
pCO2 arterial: 56.8 — ABNORMAL HIGH
pH, Arterial: 7.318 — ABNORMAL LOW
pO2, Arterial: 195 — ABNORMAL HIGH

## 2011-04-02 LAB — CBC
HCT: 38.4
Hemoglobin: 12.9
MCHC: 33.6
MCV: 85.1
Platelets: 129 — ABNORMAL LOW
RBC: 4.52
RDW: 12.9
WBC: 8.5

## 2011-04-02 LAB — COMPREHENSIVE METABOLIC PANEL
ALT: 16
AST: 21
Albumin: 3.5
Alkaline Phosphatase: 76
BUN: 11
CO2: 29
Calcium: 8.7
Chloride: 103
Creatinine, Ser: 0.32 — ABNORMAL LOW
GFR calc Af Amer: >60
GFR calc non Af Amer: >60
Glucose, Bld: 114 — ABNORMAL HIGH
Potassium: 3.8
Sodium: 140
Total Bilirubin: 0.9
Total Protein: 6.2

## 2011-04-02 LAB — DIFFERENTIAL
Basophils Absolute: 0
Basophils Relative: 0
Eosinophils Absolute: 0
Eosinophils Relative: 1
Lymphocytes Relative: 14
Lymphs Abs: 1.2
Monocytes Absolute: 0.2
Monocytes Relative: 2 — ABNORMAL LOW
Neutro Abs: 7
Neutrophils Relative %: 83 — ABNORMAL HIGH

## 2013-04-02 ENCOUNTER — Inpatient Hospital Stay (HOSPITAL_COMMUNITY)
Admission: AD | Admit: 2013-04-02 | Discharge: 2013-04-29 | DRG: 584 | Disposition: A | Discharge status: Home / Self Care | Payer: BC Managed Care – PPO | Attending: Internal Medicine | Admitting: Internal Medicine

## 2013-04-02 ENCOUNTER — Emergency Department (HOSPITAL_COMMUNITY): Payer: BC Managed Care – PPO

## 2013-04-02 DIAGNOSIS — T17908A Unspecified foreign body in respiratory tract, part unspecified causing other injury, initial encounter: Secondary | ICD-10-CM | POA: Diagnosis not present

## 2013-04-02 DIAGNOSIS — G8250 Quadriplegia, unspecified: Secondary | ICD-10-CM

## 2013-04-02 DIAGNOSIS — M6250 Muscle wasting and atrophy, not elsewhere classified, unspecified site: Secondary | ICD-10-CM | POA: Diagnosis present

## 2013-04-02 DIAGNOSIS — E43 Unspecified severe protein-calorie malnutrition: Secondary | ICD-10-CM

## 2013-04-02 DIAGNOSIS — E870 Hyperosmolality and hypernatremia: Secondary | ICD-10-CM | POA: Diagnosis not present

## 2013-04-02 DIAGNOSIS — L89109 Pressure ulcer of unspecified part of back, unspecified stage: Secondary | ICD-10-CM | POA: Diagnosis present

## 2013-04-02 DIAGNOSIS — L8995 Pressure ulcer of unspecified site, unstageable: Secondary | ICD-10-CM | POA: Diagnosis present

## 2013-04-02 DIAGNOSIS — Z66 Do not resuscitate: Secondary | ICD-10-CM | POA: Diagnosis not present

## 2013-04-02 DIAGNOSIS — J9811 Atelectasis: Secondary | ICD-10-CM

## 2013-04-02 DIAGNOSIS — J9620 Acute and chronic respiratory failure, unspecified whether with hypoxia or hypercapnia: Secondary | ICD-10-CM | POA: Diagnosis present

## 2013-04-02 DIAGNOSIS — E8809 Other disorders of plasma-protein metabolism, not elsewhere classified: Secondary | ICD-10-CM | POA: Diagnosis present

## 2013-04-02 DIAGNOSIS — K5900 Constipation, unspecified: Secondary | ICD-10-CM | POA: Diagnosis not present

## 2013-04-02 DIAGNOSIS — Y846 Urinary catheterization as the cause of abnormal reaction of the patient, or of later complication, without mention of misadventure at the time of the procedure: Secondary | ICD-10-CM | POA: Diagnosis not present

## 2013-04-02 DIAGNOSIS — Z79899 Other long term (current) drug therapy: Secondary | ICD-10-CM

## 2013-04-02 DIAGNOSIS — Y9239 Other specified sports and athletic area as the place of occurrence of the external cause: Secondary | ICD-10-CM

## 2013-04-02 DIAGNOSIS — N3289 Other specified disorders of bladder: Secondary | ICD-10-CM | POA: Diagnosis present

## 2013-04-02 DIAGNOSIS — IMO0002 Reserved for concepts with insufficient information to code with codable children: Secondary | ICD-10-CM | POA: Diagnosis not present

## 2013-04-02 DIAGNOSIS — J189 Pneumonia, unspecified organism: Secondary | ICD-10-CM

## 2013-04-02 DIAGNOSIS — R339 Retention of urine, unspecified: Secondary | ICD-10-CM | POA: Diagnosis not present

## 2013-04-02 DIAGNOSIS — J9819 Other pulmonary collapse: Secondary | ICD-10-CM | POA: Diagnosis present

## 2013-04-02 DIAGNOSIS — E872 Acidosis, unspecified: Secondary | ICD-10-CM | POA: Diagnosis present

## 2013-04-02 DIAGNOSIS — Z981 Arthrodesis status: Secondary | ICD-10-CM

## 2013-04-02 DIAGNOSIS — R110 Nausea: Secondary | ICD-10-CM | POA: Diagnosis not present

## 2013-04-02 DIAGNOSIS — Y9210 Unspecified residential institution as the place of occurrence of the external cause: Secondary | ICD-10-CM | POA: Diagnosis not present

## 2013-04-02 DIAGNOSIS — N390 Urinary tract infection, site not specified: Secondary | ICD-10-CM | POA: Diagnosis present

## 2013-04-02 DIAGNOSIS — I10 Essential (primary) hypertension: Secondary | ICD-10-CM | POA: Diagnosis present

## 2013-04-02 DIAGNOSIS — S14105A Unspecified injury at C5 level of cervical spinal cord, initial encounter: Secondary | ICD-10-CM

## 2013-04-02 DIAGNOSIS — F411 Generalized anxiety disorder: Secondary | ICD-10-CM | POA: Diagnosis not present

## 2013-04-02 DIAGNOSIS — G8929 Other chronic pain: Secondary | ICD-10-CM | POA: Diagnosis present

## 2013-04-02 DIAGNOSIS — D638 Anemia in other chronic diseases classified elsewhere: Secondary | ICD-10-CM | POA: Diagnosis present

## 2013-04-02 DIAGNOSIS — R4181 Age-related cognitive decline: Secondary | ICD-10-CM | POA: Diagnosis present

## 2013-04-02 DIAGNOSIS — E876 Hypokalemia: Secondary | ICD-10-CM

## 2013-04-02 DIAGNOSIS — R7309 Other abnormal glucose: Secondary | ICD-10-CM | POA: Diagnosis not present

## 2013-04-02 DIAGNOSIS — R64 Cachexia: Secondary | ICD-10-CM | POA: Diagnosis present

## 2013-04-02 DIAGNOSIS — Z23 Encounter for immunization: Secondary | ICD-10-CM

## 2013-04-02 DIAGNOSIS — J9600 Acute respiratory failure, unspecified whether with hypoxia or hypercapnia: Secondary | ICD-10-CM

## 2013-04-02 DIAGNOSIS — J9690 Respiratory failure, unspecified, unspecified whether with hypoxia or hypercapnia: Secondary | ICD-10-CM

## 2013-04-02 DIAGNOSIS — M62838 Other muscle spasm: Secondary | ICD-10-CM | POA: Diagnosis present

## 2013-04-02 DIAGNOSIS — T8389XA Other specified complication of genitourinary prosthetic devices, implants and grafts, initial encounter: Secondary | ICD-10-CM | POA: Diagnosis not present

## 2013-04-02 DIAGNOSIS — A419 Sepsis, unspecified organism: Principal | ICD-10-CM

## 2013-04-02 HISTORY — DX: Quadriplegia, unspecified: G82.50

## 2013-04-02 HISTORY — DX: Unspecified injury at C5 level of cervical spinal cord, initial encounter: S14.105A

## 2013-04-02 MED ORDER — SODIUM CHLORIDE 0.9 % IV SOLN
10.0000 ug/h | INTRAVENOUS | Status: DC
  Filled 2013-04-02: qty 50

## 2013-04-02 MED ORDER — ETOMIDATE 2 MG/ML IV SOLN
0.3000 mg/kg | Freq: Once | INTRAVENOUS | Status: AC
  Administered 2013-04-02: 23:00:00 via INTRAVENOUS

## 2013-04-02 MED ORDER — PIPERACILLIN-TAZOBACTAM 3.375 G IVPB
3.3750 g | Freq: Once | INTRAVENOUS | Status: AC
  Administered 2013-04-03: 3.375 g via INTRAVENOUS
  Filled 2013-04-02: qty 50

## 2013-04-02 MED ORDER — SODIUM CHLORIDE 0.9 % IV SOLN
2.0000 mg/h | INTRAVENOUS | Status: DC
  Filled 2013-04-02: qty 10

## 2013-04-02 MED ORDER — MIDAZOLAM HCL 2 MG/2ML IJ SOLN
2.0000 mg | Freq: Once | INTRAMUSCULAR | Status: AC
  Administered 2013-04-02: 2 mg via INTRAVENOUS

## 2013-04-02 MED ORDER — SUCCINYLCHOLINE CHLORIDE 20 MG/ML IJ SOLN
100.0000 mg | Freq: Once | INTRAMUSCULAR | Status: AC
  Administered 2013-04-02: 100 mg via INTRAVENOUS

## 2013-04-02 MED ORDER — SODIUM CHLORIDE 0.9 % IV BOLUS (SEPSIS)
1000.0000 mL | Freq: Once | INTRAVENOUS | Status: AC
  Administered 2013-04-02: 1000 mL via INTRAVENOUS

## 2013-04-02 MED ORDER — MIDAZOLAM HCL 50 MG/10ML IJ SOLN
INTRAMUSCULAR | Status: AC
  Filled 2013-04-02: qty 1

## 2013-04-02 MED ORDER — VANCOMYCIN HCL IN DEXTROSE 1-5 GM/200ML-% IV SOLN
1000.0000 mg | Freq: Once | INTRAVENOUS | Status: AC
  Administered 2013-04-03: 1000 mg via INTRAVENOUS
  Filled 2013-04-02: qty 200

## 2013-04-02 MED ORDER — FENTANYL CITRATE 0.05 MG/ML IJ SOLN
INTRAMUSCULAR | Status: AC
  Filled 2013-04-02: qty 50

## 2013-04-02 MED ORDER — FENTANYL CITRATE 0.05 MG/ML IJ SOLN
50.0000 ug | Freq: Once | INTRAMUSCULAR | Status: AC
  Administered 2013-04-02: 50 ug via INTRAVENOUS

## 2013-04-02 MED ORDER — MIDAZOLAM HCL 2 MG/2ML IJ SOLN
INTRAMUSCULAR | Status: AC
  Filled 2013-04-02: qty 2

## 2013-04-02 NOTE — ED Notes (Signed)
Pt to department via EMS. Per report, pt recently diagnosed with pneumonia.  Pt became SOB and unresponsive about 30 min ago.  EMS reports pt was agonal breathing upon arrival.  Pt tachycardic.  Pt being bagged with bag valve mask. Marland Kitchen

## 2013-04-02 NOTE — ED Provider Notes (Signed)
CSN: [id number]     Arrival date & time    History   None   Scribed for Sunnie Nielsen, MD, the patient was seen in room APA02/APA02. This chart was scribed by Lewanda Rife, ED scribe. Patient's care was started at 11:05 PM  No chief complaint on file.  (Consider location/radiation/quality/duration/timing/severity/associated sxs/prior Treatment) The history is provided by the EMS personnel and the spouse. A language interpreter was used.   Level 5 Caveat due to severe respiratory distress. HPI Comments: [name] Andersen is a 62 y.o. female who presents to the Emergency Department with a PMHx of quadriplegia secondary c-spine injury and a recent dx of pneumonia in severe respiratory distress. Pt transported from home. EMS reports pt cyanotic on the scene, 40 respirations per minute, and agonal breathing. King airway was placed PTA. Pt is a full code per EMS.    No past medical history on file. No past surgical history on file. No family history on file. History  Substance Use Topics  . Smoking status: Not on file  . Smokeless tobacco: Not on file  . Alcohol Use: Not on file   OB History   No data available     Review of Systems  Unable to perform ROS: Severe respiratory distress    Allergies  Review of patient's allergies indicates not on file.  Home Medications  No current outpatient prescriptions on file. There were no vitals taken for this visit. Physical Exam  Nursing note and vitals reviewed. Constitutional: She appears well-developed and well-nourished. She appears distressed.   Cachectic  HENT:  Head: Normocephalic and atraumatic.  Eyes: Conjunctivae and EOM are normal. Pupils are equal, round, and reactive to light.  Pupils are reactive bilaterally and equal   Neck: No tracheal deviation present.  Trachea is midline   Cardiovascular: Tachycardia present.   Weak peripheral pulses  Pulmonary/Chest: She is in respiratory distress.  Coarse breath sounds through  out  [place] airway in place with assisted respirations  Abdominal: Soft. She exhibits no distension.  Musculoskeletal: Normal range of motion.  Diffuse atrophy, and not moving   Neurological:  Blinks her eyes, following commands, is awake, non-verbal, and not moving extremities.   Skin: Skin is warm. She is diaphoretic.  Warm to the touch    ED Course  INTUBATION Date/Time: 04/02/2013 11:36 PM Performed by: Sunnie Nielsen Authorized by: Sunnie Nielsen Consent: The procedure was performed in an emergent situation. Required items: required blood products, implants, devices, and special equipment available Patient identity confirmed: arm band Time out: Immediately prior to procedure a "time out" was called to verify the correct patient, procedure, equipment, support staff and site/side marked as required. Indications: respiratory distress Intubation method: direct Patient status: paralyzed (RSI) Sedatives: etomidate Paralytic: succinylcholine Laryngoscope size: Mac 3 Tube type: cuffed Number of attempts: 1 Ventilation between attempts: BVM Cords visualized: yes Post-procedure assessment: chest rise and CO2 detector Breath sounds: equal and absent over the epigastrium Cuff inflated: yes ETT to teeth: 21 cm Tube secured with: ETT holder Chest x-ray findings: endotracheal tube too high Patient tolerance: Patient tolerated the procedure well with no immediate complications.  CENTRAL LINE Date/Time: 04/03/2013 1:22 AM Performed by: Sunnie Nielsen Authorized by: Sunnie Nielsen Consent: The procedure was performed in an emergent situation. Required items: required blood products, implants, devices, and special equipment available Patient identity confirmed: arm band Time out: Immediately prior to procedure a "time out" was called to verify the correct patient, procedure, equipment, support staff and site/side marked  as required. Indications: vascular access Anesthesia: local  infiltration Local anesthetic: lidocaine 1% with epinephrine Anesthetic total: 2 ml Preparation: skin prepped with 2% chlorhexidine Skin prep agent dried: skin prep agent completely dried prior to procedure Sterile barriers: all five maximum sterile barriers used - cap, mask, sterile gown, sterile gloves, and large sterile sheet Hand hygiene: hand hygiene performed prior to central venous catheter insertion Location details: right internal jugular Patient position: Trendelenburg Catheter type: triple lumen Pre-procedure: landmarks identified Ultrasound guidance: yes Number of attempts: 1 Post-procedure: line sutured Assessment: blood return through all ports and free fluid flow Patient tolerance: Patient tolerated the procedure well with no immediate complications.   DIAGNOSTIC STUDIES: Oxygen Saturation is 85% with bagging and king airway in place, low by my interpretation.     (including critical care time) CRITICAL CARE Performed by: Sunnie Nielsen, MD Total critical care time: 92 Critical care time was exclusive of separately billable procedures and treating other patients. Critical care was necessary to treat or prevent imminent or life-threatening deterioration. Critical care was time spent personally by me on the following activities: development of treatment plan with patient and/or surrogate as well as nursing, discussions with consultants, evaluation of patient's response to treatment, examination of patient, obtaining history from patient or surrogate, ordering and performing treatments and interventions, ordering and review of laboratory studies, ordering and review of radiographic studies, pulse oximetry and re-evaluation of patient's condition.IV ABX, vent management, IV potassium, sedation  11:20: Old records reviewed: 2012 hx of quadriplegia secondary to a C-spine injury. Pt is wheel chair bound and requires 24 hour care.  11:35 PM  Discussed with husband who was with her  at home she was started on abx yesterday by her PCP for suspected pneumonia. Tonight when going to bed had increased difficulty breathing and pulse ox in the 70's despite supplemental oxygen, which husband gives her on occasion as needed. Her baseline is quadriplegia, wheelchair bound, verbal and interactive with hx of C5 and C6 injury.  12:34 AM Consulted with critical care Dr. Molli Knock and recommended local admission. Pt not suitable for transports due to low blood pressure at this time.   D/w [facility] DR Rito Ehrlich unable to accept PT here for admit.  D/w PCCM again and Dr Molli Knock accepts to ICU at [facility] with CVC  Labs Review Labs Reviewed  CBC - Abnormal; Notable for the following:    Hemoglobin 11.5 (*)    HCT 35.0 (*)    All other components within normal limits  COMPREHENSIVE METABOLIC PANEL - Abnormal; Notable for the following:    Sodium 132 (*)    Potassium 2.9 (*)    Chloride 92 (*)    CO2 33 (*)    Glucose, Bld 176 (*)    Creatinine, Ser 0.26 (*)    Calcium 7.2 (*)    Total Protein 5.2 (*)    Albumin 2.3 (*)    All other components within normal limits  POCT I-STAT, CHEM 8 - Abnormal; Notable for the following:    Potassium 2.8 (*)    Chloride 93 (*)    Creatinine, Ser 0.40 (*)    Glucose, Bld 141 (*)    Calcium, Ion 0.95 (*)    Hemoglobin 11.2 (*)    HCT 33.0 (*)    All other components within normal limits  CULTURE, BLOOD (ROUTINE X 2)  CULTURE, BLOOD (ROUTINE X 2)  LACTIC ACID, PLASMA  BLOOD GAS, ARTERIAL  URINALYSIS, ROUTINE W REFLEX MICROSCOPIC  POCT I-STAT TROPONIN  I  CG4 I-STAT (LACTIC ACID)   Imaging Review Dg Chest Portable 1 View  04/03/2013   CLINICAL DATA:  Pneumonia and possible sepsis.  EXAM: PORTABLE CHEST - 1 VIEW  COMPARISON:  04/03/2013.  FINDINGS: Endotracheal tube ends 3 cm above the carina. An enteric tube crosses the diaphragm, with side port near the expected gastroesophageal junction. Unchanged positioning of right IJ catheter.  Stable heart size  and mediastinal contours. Improved left lung aeration with persistent left base opacity. No evidence of effusion or pneumothorax.  IMPRESSION: 1. New enteric tube reaches the stomach. 2. Remaining support apparatus in unchanged position. 3. Improved left lung aeration.   Electronically Signed   By: Tiburcio Pea M.D.   On: 04/03/2013 03:43   Dg Chest Portable 1 View  04/03/2013   *RADIOLOGY REPORT*  Clinical Data: Line placement.  PORTABLE CHEST - 1 VIEW  Comparison: Chest radiograph April 02, 2013 at 2322 hours.  Findings: Endotracheal tube tip projects at 3.2 cm above the carina, unchanged.  Interval placement right internal jugular central venous catheter, with distal tip projecting back to brachiocephalic confluence.  No pneumothorax.  Cardiac silhouette appears moderately enlarged, diffuse interstitial prominence with left perihilar, left lower lobe air space opacity again noted.  No pleural effusions.  Soft tissue planes and included osseous structures are not suspicious; cerclage wires within the included cervical spine.  IMPRESSION: Right internal jugular central venous catheter tip projects at brachiocephalic confluence.  No apparent change in endotracheal tube.  No pneumothorax.  Stable cardiomegaly with left perihilar and left lower lobe air space opacity, which could reflect pneumonia.  Recommend follow-up chest radiograph after treatment to verify improvement.   Original Report Authenticated By: Awilda Metro   Dg Chest Portable 1 View  04/02/2013   CLINICAL DATA:  Endotracheal tube placement  EXAM: PORTABLE CHEST - 1 VIEW  COMPARISON:  03/18/2007  FINDINGS: Endotracheal tube ends 4 cm above the carina.  COPD with diffuse bronchial wall thickening and hyperinflation. There is new multi focal left lung opacity with mild volume loss. No definite effusion or pneumothorax.  Cervical spine cerclage wires are unremarkable in the frontal projection.  IMPRESSION: 1. Good positioning of  endotracheal tube. 2. Multi focal left lung infiltrate, most likely pneumonia. 3. COPD.   Electronically Signed   By: Tiburcio Pea M.D.   On: 04/02/2013 23:46    EKG Interpretation     Ventricular Rate:  139 PR Interval:    QRS Duration: 94 QT Interval:  300 QTC Calculation: 456 R Axis:   75 Text Interpretation:  Atrial fibrillation with rapid ventricular response Normal Axis Nonspecific ST abnormality Abnormal ECG No previous ECGs available            MDM  DX: Respiratory Failure, hypotension, Sepsis  Intubated emergently and central line placed Evaluated with EKG, labs and imaging as above Treated with IV fluids and blood pressure improved IV antibiotics initiated ICU admit  I personally performed the services described in this documentation, which was scribed in my presence. The recorded information has been reviewed and is accurate.    Sunnie Nielsen, MD 04/03/13 [id number]

## 2013-04-02 NOTE — ED Notes (Signed)
Pt intubated.  21cm at lip.

## 2013-04-03 ENCOUNTER — Inpatient Hospital Stay (HOSPITAL_COMMUNITY): Payer: BC Managed Care – PPO

## 2013-04-03 ENCOUNTER — Encounter (HOSPITAL_COMMUNITY): Payer: Self-pay | Admitting: Internal Medicine

## 2013-04-03 DIAGNOSIS — S14105A Unspecified injury at C5 level of cervical spinal cord, initial encounter: Secondary | ICD-10-CM

## 2013-04-03 DIAGNOSIS — J189 Pneumonia, unspecified organism: Secondary | ICD-10-CM

## 2013-04-03 DIAGNOSIS — J9600 Acute respiratory failure, unspecified whether with hypoxia or hypercapnia: Secondary | ICD-10-CM

## 2013-04-03 DIAGNOSIS — G8250 Quadriplegia, unspecified: Secondary | ICD-10-CM | POA: Diagnosis present

## 2013-04-03 DIAGNOSIS — E43 Unspecified severe protein-calorie malnutrition: Secondary | ICD-10-CM | POA: Diagnosis present

## 2013-04-03 LAB — URINALYSIS, ROUTINE W REFLEX MICROSCOPIC
Glucose, UA: NEGATIVE mg/dL
Ketones, ur: 40 mg/dL — AB
Protein, ur: 100 mg/dL — AB
Urobilinogen, UA: 1 mg/dL (ref 0.0–1.0)

## 2013-04-03 LAB — POCT I-STAT, CHEM 8
BUN: 10 mg/dL (ref 6–23)
Creatinine, Ser: 0.4 mg/dL — ABNORMAL LOW (ref 0.50–1.10)
Glucose, Bld: 141 mg/dL — ABNORMAL HIGH (ref 70–99)
Potassium: 2.8 mEq/L — ABNORMAL LOW (ref 3.5–5.1)
Sodium: 135 mEq/L (ref 135–145)
TCO2: 30 mmol/L (ref 0–100)

## 2013-04-03 LAB — CBC
HCT: 33.6 % — ABNORMAL LOW (ref 36.0–46.0)
Hemoglobin: 11.5 g/dL — ABNORMAL LOW (ref 12.0–15.0)
MCH: 29 pg (ref 26.0–34.0)
MCHC: 32.9 g/dL (ref 30.0–36.0)
MCHC: 34.2 g/dL (ref 30.0–36.0)
MCV: 85.1 fL (ref 78.0–100.0)
MCV: 88.2 fL (ref 78.0–100.0)
Platelets: 150 10*3/uL (ref 150–400)
RBC: 3.97 MIL/uL (ref 3.87–5.11)
RDW: 12.7 % (ref 11.5–15.5)
RDW: 12.9 % (ref 11.5–15.5)
WBC: 7.3 10*3/uL (ref 4.0–10.5)
WBC: 7.6 10*3/uL (ref 4.0–10.5)

## 2013-04-03 LAB — COMPREHENSIVE METABOLIC PANEL
ALT: 15 U/L (ref 0–35)
AST: 36 U/L (ref 0–37)
Albumin: 2.2 g/dL — ABNORMAL LOW (ref 3.5–5.2)
Albumin: 2.3 g/dL — ABNORMAL LOW (ref 3.5–5.2)
BUN: 7 mg/dL (ref 6–23)
CO2: 30 mEq/L (ref 19–32)
CO2: 33 mEq/L — ABNORMAL HIGH (ref 19–32)
Calcium: 6.9 mg/dL — ABNORMAL LOW (ref 8.4–10.5)
Calcium: 7.2 mg/dL — ABNORMAL LOW (ref 8.4–10.5)
Chloride: 92 mEq/L — ABNORMAL LOW (ref 96–112)
Chloride: 96 mEq/L (ref 96–112)
Creatinine, Ser: 0.26 mg/dL — ABNORMAL LOW (ref 0.50–1.10)
Creatinine, Ser: <0.2 mg/dL — ABNORMAL LOW (ref 0.50–1.10)
GFR calc Af Amer: >90 mL/min (ref >90)
GFR calc non Af Amer: >90 mL/min (ref >90)
Glucose, Bld: 162 mg/dL — ABNORMAL HIGH (ref 70–99)
Glucose, Bld: 176 mg/dL — ABNORMAL HIGH (ref 70–99)
Potassium: 2.9 mEq/L — ABNORMAL LOW (ref 3.5–5.1)
Sodium: 132 mEq/L — ABNORMAL LOW (ref 135–145)
Total Bilirubin: 0.5 mg/dL (ref 0.3–1.2)
Total Bilirubin: 0.8 mg/dL (ref 0.3–1.2)

## 2013-04-03 LAB — BLOOD GAS, ARTERIAL
Acid-Base Excess: 4.9 mmol/L — ABNORMAL HIGH (ref 0.0–2.0)
Bicarbonate: 31.2 mEq/L — ABNORMAL HIGH (ref 20.0–24.0)
Bicarbonate: 29.2 mEq/L — ABNORMAL HIGH (ref 20.0–24.0)
Drawn by: 10555
Drawn by: 22223
Drawn by: 10006
FIO2: 100 %
FIO2: 100 %
FIO2: 0.5 %
MECHVT: 400 mL
MECHVT: 450 mL
O2 Saturation: 74.9 %
O2 Saturation: 99.8 %
O2 Saturation: 95.3 %
PEEP: 5 cmH2O
Patient temperature: 37
Patient temperature: 37
Patient temperature: 98.6
RATE: 14 resp/min
RATE: 16 resp/min
RATE: 12 resp/min
TCO2: 29.3 mmol/L (ref 0–100)
TCO2: 30.8 mmol/L (ref 0–100)
pCO2 arterial: 68.1 mmHg (ref 35.0–45.0)
pH, Arterial: 7.283 — ABNORMAL LOW (ref 7.350–7.450)
pO2, Arterial: 441 mmHg — ABNORMAL HIGH (ref 80.0–100.0)
pO2, Arterial: 45.2 mmHg — ABNORMAL LOW (ref 80.0–100.0)
pO2, Arterial: 79.2 mmHg — ABNORMAL LOW (ref 80.0–100.0)

## 2013-04-03 LAB — TYPE AND SCREEN
ABO/RH(D): A POS
Antibody Screen: NEGATIVE

## 2013-04-03 LAB — URINE MICROSCOPIC-ADD ON

## 2013-04-03 LAB — ABO/RH: ABO/RH(D): A POS

## 2013-04-03 LAB — BASIC METABOLIC PANEL
CO2: 31 mEq/L (ref 19–32)
Calcium: 8.2 mg/dL — ABNORMAL LOW (ref 8.4–10.5)
Creatinine, Ser: <0.2 mg/dL — ABNORMAL LOW (ref 0.50–1.10)
Glucose, Bld: 72 mg/dL (ref 70–99)
Potassium: 4.2 mEq/L (ref 3.5–5.1)

## 2013-04-03 LAB — GLUCOSE, CAPILLARY
Glucose-Capillary: 163 mg/dL — ABNORMAL HIGH (ref 70–99)
Glucose-Capillary: 67 mg/dL — ABNORMAL LOW (ref 70–99)
Glucose-Capillary: 74 mg/dL (ref 70–99)
Glucose-Capillary: 75 mg/dL (ref 70–99)

## 2013-04-03 LAB — LACTIC ACID, PLASMA: Lactic Acid, Venous: 0.7 mmol/L (ref 0.5–2.2)

## 2013-04-03 LAB — CORTISOL: Cortisol, Plasma: 15.4 ug/dL

## 2013-04-03 MED ORDER — DEXTROSE 5 % IV SOLN
2.0000 g | INTRAVENOUS | Status: DC
  Filled 2013-04-03: qty 2

## 2013-04-03 MED ORDER — BACLOFEN 20 MG PO TABS
20.0000 mg | ORAL_TABLET | Freq: Three times a day (TID) | ORAL | Status: DC
  Administered 2013-04-03 – 2013-04-05 (×7): 20 mg
  Filled 2013-04-03 (×11): qty 1

## 2013-04-03 MED ORDER — VITAL AF 1.2 CAL PO LIQD
1000.0000 mL | ORAL | Status: DC
  Administered 2013-04-03 – 2013-04-04 (×2): 1000 mL
  Filled 2013-04-03 (×5): qty 1000

## 2013-04-03 MED ORDER — GABAPENTIN 250 MG/5ML PO SOLN
600.0000 mg | Freq: Three times a day (TID) | ORAL | Status: DC
  Filled 2013-04-03 (×3): qty 12

## 2013-04-03 MED ORDER — FENTANYL CITRATE 0.05 MG/ML IJ SOLN
25.0000 ug | INTRAMUSCULAR | Status: DC | PRN
  Administered 2013-04-03 (×2): 50 ug via INTRAVENOUS
  Filled 2013-04-03 (×2): qty 2

## 2013-04-03 MED ORDER — INFLUENZA VAC SPLIT QUAD 0.5 ML IM SUSP: 0.5000 mL | INTRAMUSCULAR | Status: DC

## 2013-04-03 MED ORDER — INFLUENZA VAC SPLIT QUAD 0.5 ML IM SUSP
0.5000 mL | INTRAMUSCULAR | Status: AC
  Administered 2013-04-04: 0.5 mL via INTRAMUSCULAR
  Filled 2013-04-03: qty 0.5

## 2013-04-03 MED ORDER — FENTANYL CITRATE 0.05 MG/ML IJ SOLN: 100.0000 ug | Freq: Once | INTRAMUSCULAR | Status: DC

## 2013-04-03 MED ORDER — POTASSIUM CHLORIDE 10 MEQ/50ML IV SOLN
10.0000 meq | INTRAVENOUS | Status: AC
  Administered 2013-04-03 (×4): 10 meq via INTRAVENOUS
  Filled 2013-04-03 (×4): qty 50

## 2013-04-03 MED ORDER — VANCOMYCIN HCL 500 MG IV SOLR
500.0000 mg | INTRAVENOUS | Status: DC
  Administered 2013-04-03: 500 mg via INTRAVENOUS
  Filled 2013-04-03 (×2): qty 500

## 2013-04-03 MED ORDER — MIDAZOLAM HCL 2 MG/2ML IJ SOLN
2.0000 mg | Freq: Once | INTRAMUSCULAR | Status: AC
  Administered 2013-04-03: 2 mg via INTRAVENOUS
  Filled 2013-04-03: qty 2

## 2013-04-03 MED ORDER — MORPHINE SULFATE 4 MG/ML IJ SOLN
4.0000 mg | Freq: Once | INTRAMUSCULAR | Status: AC
  Administered 2013-04-03: 4 mg via INTRAVENOUS
  Filled 2013-04-03: qty 1

## 2013-04-03 MED ORDER — ARTIFICIAL TEARS OP OINT: 1.0000 "application " | TOPICAL_OINTMENT | Freq: Three times a day (TID) | OPHTHALMIC | Status: DC

## 2013-04-03 MED ORDER — INSULIN ASPART 100 UNIT/ML ~~LOC~~ SOLN
2.0000 [IU] | SUBCUTANEOUS | Status: DC
  Administered 2013-04-03: 4 [IU] via SUBCUTANEOUS
  Administered 2013-04-04: 2 [IU] via SUBCUTANEOUS
  Administered 2013-04-04 – 2013-04-06 (×4): 4 [IU] via SUBCUTANEOUS

## 2013-04-03 MED ORDER — POTASSIUM CHLORIDE 10 MEQ/100ML IV SOLN
10.0000 meq | Freq: Once | INTRAVENOUS | Status: AC
  Administered 2013-04-03: 10 meq via INTRAVENOUS
  Filled 2013-04-03: qty 100

## 2013-04-03 MED ORDER — SODIUM CHLORIDE 0.9 % IV SOLN
250.0000 mL | INTRAVENOUS | Status: DC | PRN
  Administered 2013-04-09: 250 mL via INTRAVENOUS
  Administered 2013-04-12: 04:00:00 via INTRAVENOUS
  Administered 2013-04-13: 500 mL via INTRAVENOUS

## 2013-04-03 MED ORDER — SODIUM CHLORIDE 0.9 % IV BOLUS (SEPSIS)
1000.0000 mL | INTRAVENOUS | Status: DC | PRN
  Administered 2013-04-26: 1000 mL via INTRAVENOUS

## 2013-04-03 MED ORDER — PANTOPRAZOLE SODIUM 40 MG IV SOLR
40.0000 mg | Freq: Every day | INTRAVENOUS | Status: DC
  Administered 2013-04-03: 40 mg via INTRAVENOUS
  Filled 2013-04-03 (×2): qty 40

## 2013-04-03 MED ORDER — SODIUM CHLORIDE 0.9 % IV SOLN
3.0000 ug/kg/min | INTRAVENOUS | Status: DC
  Filled 2013-04-03: qty 20

## 2013-04-03 MED ORDER — SODIUM CHLORIDE 0.9 % IV BOLUS (SEPSIS)
500.0000 mL | Freq: Once | INTRAVENOUS | Status: AC
  Administered 2013-04-03: 500 mL via INTRAVENOUS

## 2013-04-03 MED ORDER — MIDAZOLAM HCL 2 MG/2ML IJ SOLN
1.0000 mg | INTRAMUSCULAR | Status: DC | PRN
  Administered 2013-04-03 (×2): 2 mg via INTRAVENOUS
  Administered 2013-04-04 (×2): 1 mg via INTRAVENOUS
  Administered 2013-04-04: 2 mg via INTRAVENOUS
  Filled 2013-04-03 (×5): qty 2

## 2013-04-03 MED ORDER — HEPARIN SODIUM (PORCINE) 5000 UNIT/ML IJ SOLN
5000.0000 [IU] | Freq: Three times a day (TID) | INTRAMUSCULAR | Status: DC
  Administered 2013-04-03 – 2013-04-10 (×22): 5000 [IU] via SUBCUTANEOUS
  Filled 2013-04-03 (×25): qty 1

## 2013-04-03 MED ORDER — PANTOPRAZOLE SODIUM 40 MG PO PACK
40.0000 mg | PACK | Freq: Every day | ORAL | Status: DC
  Administered 2013-04-03 – 2013-04-04 (×2): 40 mg
  Filled 2013-04-03 (×4): qty 20

## 2013-04-03 MED ORDER — DEXTROSE 5 % IV SOLN
1.0000 g | INTRAVENOUS | Status: DC
  Administered 2013-04-04 – 2013-04-13 (×10): 1 g via INTRAVENOUS
  Filled 2013-04-03 (×10): qty 1

## 2013-04-03 MED ORDER — PNEUMOCOCCAL VAC POLYVALENT 25 MCG/0.5ML IJ INJ
0.5000 mL | INJECTION | INTRAMUSCULAR | Status: AC
  Administered 2013-04-04: 0.5 mL via INTRAMUSCULAR
  Filled 2013-04-03: qty 0.5

## 2013-04-03 MED ORDER — CHLORHEXIDINE GLUCONATE 0.12 % MT SOLN
15.0000 mL | Freq: Two times a day (BID) | OROMUCOSAL | Status: DC
  Administered 2013-04-03 – 2013-04-06 (×7): 15 mL via OROMUCOSAL
  Filled 2013-04-03 (×7): qty 15

## 2013-04-03 MED ORDER — SODIUM CHLORIDE 0.9 % IV SOLN
25.0000 ug/h | INTRAVENOUS | Status: DC
  Administered 2013-04-03: 100 ug/h via INTRAVENOUS
  Administered 2013-04-04: 50 ug/h via INTRAVENOUS
  Administered 2013-04-04: 100 ug/h via INTRAVENOUS
  Filled 2013-04-03 (×3): qty 50

## 2013-04-03 MED ORDER — FENTANYL BOLUS VIA INFUSION
25.0000 ug | Freq: Four times a day (QID) | INTRAVENOUS | Status: DC | PRN
  Filled 2013-04-03: qty 100

## 2013-04-03 MED ORDER — POTASSIUM CHLORIDE 20 MEQ/15ML (10%) PO LIQD
40.0000 meq | Freq: Three times a day (TID) | ORAL | Status: AC
  Administered 2013-04-03 (×2): 40 meq
  Filled 2013-04-03 (×2): qty 30

## 2013-04-03 MED ORDER — BIOTENE DRY MOUTH MT LIQD
15.0000 mL | Freq: Four times a day (QID) | OROMUCOSAL | Status: DC
  Administered 2013-04-03 – 2013-04-28 (×78): 15 mL via OROMUCOSAL

## 2013-04-03 MED ORDER — VANCOMYCIN HCL IN DEXTROSE 1-5 GM/200ML-% IV SOLN: 1000.0000 mg | Freq: Once | INTRAVENOUS | Status: DC

## 2013-04-03 MED ORDER — CISATRACURIUM BOLUS VIA INFUSION
0.1000 mg/kg | Freq: Once | INTRAVENOUS | Status: DC
  Filled 2013-04-03: qty 4

## 2013-04-03 MED ORDER — GABAPENTIN 250 MG/5ML PO SOLN
600.0000 mg | Freq: Three times a day (TID) | ORAL | Status: DC
  Administered 2013-04-03 – 2013-04-06 (×9): 600 mg
  Filled 2013-04-03 (×12): qty 12

## 2013-04-03 MED ORDER — PROPOFOL 10 MG/ML IV EMUL
5.0000 ug/kg/min | INTRAVENOUS | Status: DC
  Administered 2013-04-03: 20 ug/kg/min via INTRAVENOUS
  Filled 2013-04-03: qty 100

## 2013-04-03 MED ORDER — SODIUM CHLORIDE 0.9 % IV BOLUS (SEPSIS)
1000.0000 mL | Freq: Once | INTRAVENOUS | Status: AC
Start: 2013-04-03 — End: 2013-04-03
  Administered 2013-04-03: 1000 mL via INTRAVENOUS

## 2013-04-03 MED ORDER — NOREPINEPHRINE BITARTRATE 1 MG/ML IJ SOLN
2.0000 ug/min | INTRAVENOUS | Status: DC
  Filled 2013-04-03: qty 4

## 2013-04-03 MED ORDER — SODIUM CHLORIDE 0.9 % IV SOLN: INTRAVENOUS | Status: DC

## 2013-04-03 MED ORDER — CEFEPIME HCL 2 G IJ SOLR
2.0000 g | Freq: Once | INTRAMUSCULAR | Status: AC
  Administered 2013-04-03: 2 g via INTRAVENOUS
  Filled 2013-04-03: qty 2
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
## 2013-04-03 NOTE — Consult Note (Addendum)
WOC wound consult note Reason for Consult: Consult requested for sacrum wounds.  Wound type: Dry scabbed area 2X1cm of tightly adhered yellow crusted skin appears to be healed previous wound.  Above this on higher sacrum is a deep tissue injury 2X2cm, dark reddish purple and beginning to peel at edges. Small amt yellow drainage, no odor or open wound at this time.  Wound is high risk to evolve into full thickness. Pt is emaciated, incontinent of stool, immobile, and critically ill.   Right posterior heel with .2X.2cm dark red deep tissue injury. Pressure Ulcer POA: Yes Dressing procedure/placement/frequency: Pt on Sport low air loss bed to reduce pressure.  Heels appropriately floated.  Nutrition consult has been requested.  Foam dressing to protect sacrum. Educational handout left in room regarding pressure ulcer information. No family present to discuss pressure ulcer etiology, topical treatment, and preventive measures and pt is intubated. Please re-consult if further assistance is needed.  Thank-you,  Cammie Mcgee MSN, RN, CWOCN, [place], CNS [phone]

## 2013-04-03 NOTE — Progress Notes (Signed)
[facility]Link Physician-Brief Progress Note Patient Name: [name] Andersen DOB: 20-Dec-1950 MRN: [id number]  Date of Service  04/03/2013   HPI/Events of Note   Hypokalemia  eICU Interventions  40 IV given      YACOUB,WESAM 04/03/2013, 5:01 AM

## 2013-04-03 NOTE — ED Notes (Signed)
Improved BP and heartrate

## 2013-04-03 NOTE — Progress Notes (Deleted)
Repeat abg on 70% fio2/10 peep  01/13/53/71 is PF Ratio 100 is SEVERE ARDS  Plan - deepen sedaton - BIS score < 60  - start 48h nimbex  - contnue ARDS protocol   Dr. Kalman Shan, M.D., [facility].C.P Pulmonary and Critical Care Medicine Staff Physician [facility] [facility] Pager: [phone], If no answer or between  15:00h - 7:00h: call [phone]  04/03/2013 6:44 PM

## 2013-04-03 NOTE — ED Notes (Signed)
EDP aware of hypotension  °
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
## 2013-04-03 NOTE — Progress Notes (Signed)
Patient on fent gtt + dirpivan gtt sBP 70s despite 1L fluid bolus I gave earlier  Plan Dc diprivan gtt Repeat 500cc bolus  Dr. Kalman Shan, M.D., [facility].C.P Pulmonary and Critical Care Medicine Staff Physician [facility]monary and Critical Care Pager: [phone], If no answer or between  15:00h - 7:00h: call [phone]  04/03/2013 8:01 PM

## 2013-04-03 NOTE — Progress Notes (Signed)
ANTIBIOTIC CONSULT NOTE - FOLLOW UP  Pharmacy Consult for Vancomycin + Cefepime Indication: R/o sepsis/PNA  No Known Allergies  Patient Measurements: Height: 5\' 6"  (167.6 cm) Weight: 81 lb 2.1 oz (36.8 kg) IBW/kg (Calculated) : 59.3  Vital Signs: Temp: 97.5 F (36.4 C) (10/13 1224) Temp src: Oral (10/13 1224) BP: 171/109 mmHg (10/13 1400) Pulse Rate: 78 (10/13 1400) Intake/Output from previous day: 10/12 0701 - 10/13 0700 In: 320 [I.V.:70; IV Piggyback:250] Out: 475 [Urine:475] Intake/Output from this shift: Total I/O In: 115 [I.V.:65; IV Piggyback:50] Out: 285 [Urine:285]  Labs:  Recent Labs  04/02/13 2345 04/03/13 0008 04/03/13 0415  WBC 7.3  --  7.6  HGB 11.5* 11.2* 11.5*  PLT 150  --  120*  CREATININE 0.26* 0.40* <0.20*   CrCl cannot be calculated (Patient has no sCr result on file.). No results found for this basename: VANCOTROUGH, Leodis Binet, VANCORANDOM, GENTTROUGH, GENTPEAK, GENTRANDOM, TOBRATROUGH, TOBRAPEAK, TOBRARND, AMIKACINPEAK, AMIKACINTROU, AMIKACIN,  in the last 72 hours   Microbiology: Recent Results (from the past 720 hour(s))  CULTURE, BLOOD (ROUTINE X 2)     Status: None   Collection Time    04/02/13 11:30 PM      Result Value Range Status   Specimen Description Blood LEFT ANTECUBITAL   Final   Special Requests BOTTLES DRAWN AEROBIC AND ANAEROBIC 5CC   Final   Culture NO GROWTH <24 HRS   Final   Report Status PENDING   Incomplete  CULTURE, BLOOD (ROUTINE X 2)     Status: None   Collection Time    04/02/13 11:45 PM      Result Value Range Status   Specimen Description Blood BRACHIAL ARTERY   Final   Special Requests BOTTLES DRAWN AEROBIC ONLY 5CC DR,OPITZ   Final   Culture NO GROWTH <24 HRS   Final   Report Status PENDING   Incomplete  MRSA PCR SCREENING     Status: None   Collection Time    04/03/13  3:18 AM      Result Value Range Status   MRSA by PCR NEGATIVE  NEGATIVE Final   Comment:            The GeneXpert MRSA Assay (FDA   approved for NASAL specimens     only), is one component of a     comprehensive MRSA colonization     surveillance program. It is not     intended to diagnose MRSA     infection nor to guide or     monitor treatment for     MRSA infections.    Anti-infectives   Start     Dose/Rate Route Frequency Ordered Stop   04/04/13 0000  ceFEPIme (MAXIPIME) 2 g in dextrose 5 % 50 mL IVPB     2 g 100 mL/hr over 30 Minutes Intravenous Every 24 hours 04/03/13 0334     04/03/13 0400  vancomycin (VANCOCIN) 500 mg in sodium chloride 0.9 % 100 mL IVPB     500 mg 100 mL/hr over 60 Minutes Intravenous Every 24 hours 04/03/13 0334     04/03/13 0315  ceFEPIme (MAXIPIME) 2 g in dextrose 5 % 50 mL IVPB     2 g 100 mL/hr over 30 Minutes Intravenous  Once 04/03/13 0304 04/03/13 0445   04/03/13 0315  vancomycin (VANCOCIN) IVPB 1000 mg/200 mL premix  Status:  Discontinued     1,000 mg 200 mL/hr over 60 Minutes Intravenous  Once 04/03/13 0304 04/03/13 0304   04/03/13  0000  vancomycin (VANCOCIN) IVPB 1000 mg/200 mL premix     1,000 mg 200 mL/hr over 60 Minutes Intravenous  Once 04/02/13 2354 04/03/13 0135   04/03/13 0000  piperacillin-tazobactam (ZOSYN) IVPB 3.375 g     3.375 g 12.5 mL/hr over 240 Minutes Intravenous  Once 04/02/13 2354 04/03/13 0136      Assessment: 62 y.o. F who continues on Vancomycin + Cefepime for r/o PNA/sepsis. The patient has quadraplegia so SCr poor estimate of CrCl given low muscle mass. Estimated CrCl likely <50 ml/min given the patient's low weight.   The patient received a dose of Vancomycin 1g in the [facility] on 10/13, followed by 500 mg on the floor -- which is a large load for a weight of 36.8 kg. Will get a random Vancomycin level tomorrow morning to access clearance prior to giving any additional Vancomycin doses. Will also reduce Cefepime to 1g/24h given the patient's low weight.  Goal of Therapy:  Vancomycin trough level 15-20 mcg/ml Proper antibiotics for infection/cultures  adjusted for renal/hepatic function   Plan:  1. Obtain random Vancomycin level on 10/14 a.m 2. Reduce Cefepime to 1g IV every 24 hours 3. Will continue to follow renal function, culture results, LOT, and antibiotic de-escalation plans   Georgina Pillion, PharmD, BCPS Clinical Pharmacist Pager: [phone] 04/03/2013 2:44 PM

## 2013-04-03 NOTE — Progress Notes (Signed)
UR Completed.  [name] Andersen [phone] 04/03/2013  

## 2013-04-03 NOTE — ED Notes (Signed)
Pt transported to [facility] via [facility] 

## 2013-04-03 NOTE — Progress Notes (Addendum)
Fentanyl infusion wasted in sink - total volume . Midazolam infusion wasted in sink - total volume 50ml.  Witnessed by Lewanda Rife CPhT.  Mady Gemma, [facility] 04/03/2013 9:57 AM
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
## 2013-04-03 NOTE — ED Notes (Signed)
Central line placed by Dr. Dierdre Highman.

## 2013-04-03 NOTE — Progress Notes (Signed)
ANTIBIOTIC CONSULT NOTE - INITIAL  Pharmacy Consult for vancomycin and cefepime Indication: rule out sepsis  Allergies not on file  Patient Measurements: Height: 5\' 6"  (167.6 cm) Weight: 81 lb 2.1 oz (36.8 kg) IBW/kg (Calculated) : 59.3  Vital Signs: Temp: 99.5 F (37.5 C) (10/13 0017) Temp src: Rectal (10/13 0017) BP: 121/81 mmHg (10/13 0200) Pulse Rate: 59 (10/13 0200)  Labs:  Recent Labs  04/02/13 2345 04/03/13 0008  WBC 7.3  --   HGB 11.5* 11.2*  PLT 150  --   CREATININE 0.26* 0.40*   Estimated Creatinine Clearance: 43.4 ml/min (by C-G formula based on Cr of 0.4).    Microbiology: Recent Results (from the past 720 hour(s))  CULTURE, BLOOD (ROUTINE X 2)     Status: None   Collection Time    04/02/13 11:30 PM      Result Value Range Status   Specimen Description Blood LEFT ANTECUBITAL   Final   Special Requests BOTTLES DRAWN AEROBIC AND ANAEROBIC 5CC   Final   Culture PENDING   Incomplete   Report Status PENDING   Incomplete  CULTURE, BLOOD (ROUTINE X 2)     Status: None   Collection Time    04/02/13 11:45 PM      Result Value Range Status   Specimen Description Blood BRACHIAL ARTERY   Final   Special Requests BOTTLES DRAWN AEROBIC ONLY 5CC DR,OPITZ   Final   Culture PENDING   Incomplete   Report Status PENDING   Incomplete    Medical History: No past medical history on file.  Medications:  No prescriptions prior to admission   Scheduled:  . sodium chloride   Intravenous STAT  . ceFEPime (MAXIPIME) IV  2 g Intravenous Once  . [START ON 04/04/2013] ceFEPime (MAXIPIME) IV  2 g Intravenous Q24H  . fentaNYL  100 mcg Intravenous Once  . heparin  5,000 Units Subcutaneous Q8H  . insulin aspart  2-6 Units Subcutaneous Q4H  . pantoprazole (PROTONIX) IV  40 mg Intravenous QHS  . vancomycin  500 mg Intravenous Q24H   Infusions:  . norepinephrine (LEVOPHED) Adult infusion      Assessment: 62yo female was recently dx'd w/ PNA, suddenly became SOB and  unresponsive, required intubation, code sepsis called, to begin IV ABX.  Goal of Therapy:  Vancomycin trough level 15-20 mcg/ml  Plan:  Will begin vancomycin 500mg  IV Q24H and cefepime 2g IV Q24H and monitor CBC, Cx, levels prn.  Vernard Gambles, PharmD, BCPS  04/03/2013,3:35 AM

## 2013-04-03 NOTE — Procedures (Signed)
Central Venous Catheter Insertion Procedure Note MERCADEZ HEITMAN [id number] [birth year]  Procedure: Insertion of Central Venous Catheter Indications: Assessment of intravascular volume, Drug and/or fluid administration and Frequent blood sampling  Procedure Details Consent: Risks of procedure as well as the alternatives and risks of each were explained to the (patient/caregiver).  Consent for procedure obtained. Time Out: Verified patient identification, verified procedure, site/side was marked, verified correct patient position, special equipment/implants available, medications/allergies/relevent history reviewed, required imaging and test results available.  Performed  Maximum sterile technique was used including antiseptics, cap, gloves, gown, hand hygiene, mask and sheet. Skin prep: Chlorhexidine; local anesthetic administered A antimicrobial bonded/coated triple lumen catheter was placed in the right internal jugular vein to 16 cm using the Seldinger technique.  Evaluation Blood flow good Complications: No apparent complications Patient did tolerate procedure well. Chest X-ray ordered to verify placement.  CXR: pending.   Procedure performed under direct supervision of Dr. Delton Coombes and with ultrasound guidance for real time vessel cannulation.     Canary Brim, NP-C [place] [facility] Pgr: [phone] or [phone] 04/03/2013, 4:55 PM  Levy Pupa, MD, PhD 04/03/2013, 4:58 PM [place] [facility] [phone] or if no answer [phone]

## 2013-04-04 ENCOUNTER — Inpatient Hospital Stay (HOSPITAL_COMMUNITY): Payer: BC Managed Care – PPO

## 2013-04-04 LAB — GLUCOSE, CAPILLARY
Glucose-Capillary: 108 mg/dL — ABNORMAL HIGH (ref 70–99)
Glucose-Capillary: 112 mg/dL — ABNORMAL HIGH (ref 70–99)
Glucose-Capillary: 151 mg/dL — ABNORMAL HIGH (ref 70–99)

## 2013-04-04 LAB — POCT I-STAT 3, ART BLOOD GAS (G3+)
Acid-Base Excess: 15 mmol/L — ABNORMAL HIGH (ref 0.0–2.0)
O2 Saturation: 99 %
Patient temperature: 98.7
TCO2: 38 mmol/L (ref 0–100)
pCO2 arterial: 34.7 mmHg — ABNORMAL LOW (ref 35.0–45.0)
pO2, Arterial: 109 mmHg — ABNORMAL HIGH (ref 80.0–100.0)

## 2013-04-04 LAB — BLOOD GAS, ARTERIAL
Acid-Base Excess: 4.8 mmol/L — ABNORMAL HIGH (ref 0.0–2.0)
Drawn by: 252031
Drawn by: 39899
MECHVT: 400 mL
MECHVT: 500 mL
O2 Saturation: 95.9 %
PEEP: 5 cmH2O
PEEP: 5 cmH2O
Patient temperature: 98.6
Patient temperature: 98.6
RATE: 10 resp/min
RATE: 16 resp/min
TCO2: 31.4 mmol/L (ref 0–100)
pCO2 arterial: 74.7 mmHg (ref 35.0–45.0)
pCO2 arterial: 52.4 mmHg — ABNORMAL HIGH (ref 35.0–45.0)
pH, Arterial: 7.227 — ABNORMAL LOW (ref 7.350–7.450)
pH, Arterial: 7.373 (ref 7.350–7.450)

## 2013-04-04 LAB — CBC
Hemoglobin: 10.5 g/dL — ABNORMAL LOW (ref 12.0–15.0)
MCH: 29 pg (ref 26.0–34.0)
MCHC: 33.1 g/dL (ref 30.0–36.0)
MCV: 87.6 fL (ref 78.0–100.0)
RDW: 13.3 % (ref 11.5–15.5)
WBC: 4.5 10*3/uL (ref 4.0–10.5)

## 2013-04-04 LAB — BASIC METABOLIC PANEL
CO2: 32 mEq/L (ref 19–32)
Calcium: 7.7 mg/dL — ABNORMAL LOW (ref 8.4–10.5)
Creatinine, Ser: <0.2 mg/dL — ABNORMAL LOW (ref 0.50–1.10)
Glucose, Bld: 111 mg/dL — ABNORMAL HIGH (ref 70–99)
Sodium: 142 mEq/L (ref 135–145)

## 2013-04-04 LAB — URINE CULTURE

## 2013-04-04 LAB — VANCOMYCIN, RANDOM: Vancomycin Rm: <5 ug/mL

## 2013-04-04 MED ORDER — SODIUM CHLORIDE 0.45 % IV SOLN
INTRAVENOUS | Status: DC
  Administered 2013-04-04 – 2013-04-07 (×4): via INTRAVENOUS
  Administered 2013-04-08: 1000 mL via INTRAVENOUS

## 2013-04-04 MED ORDER — SODIUM CHLORIDE 0.9 % IV BOLUS (SEPSIS)
500.0000 mL | Freq: Once | INTRAVENOUS | Status: AC
  Administered 2013-04-04: 500 mL via INTRAVENOUS

## 2013-04-04 MED ORDER — ALPRAZOLAM 0.5 MG PO TABS
ORAL_TABLET | ORAL | Status: AC
  Filled 2013-04-04: qty 2

## 2013-04-04 MED ORDER — MAGNESIUM SULFATE 40 MG/ML IJ SOLN
2.0000 g | Freq: Once | INTRAMUSCULAR | Status: AC
  Administered 2013-04-04: 2 g via INTRAVENOUS
  Filled 2013-04-04: qty 50

## 2013-04-04 MED ORDER — VANCOMYCIN HCL IN DEXTROSE 750-5 MG/150ML-% IV SOLN
750.0000 mg | Freq: Two times a day (BID) | INTRAVENOUS | Status: DC
  Administered 2013-04-04 – 2013-04-05 (×3): 750 mg via INTRAVENOUS
  Filled 2013-04-04 (×4): qty 150

## 2013-04-04 MED ORDER — ALPRAZOLAM 0.5 MG PO TABS
1.0000 mg | ORAL_TABLET | Freq: Once | ORAL | Status: AC
  Administered 2013-04-04: 1 mg via ORAL

## 2013-04-04 NOTE — Progress Notes (Signed)
CRITICAL VALUE ALERT  Critical value received:  CO2 on ABG 75  Date of notification:  04/04/13  Time of notification:  0400  Critical value read back:yes  Nurse who received alert:  Crist Fat RN  MD notified (1st page):  Dr. Molli Knock in elink  Time of first page:  0410(called elink & spoke with MD)  MD notified (2nd page):NA  Time of second page:NA  Responding MD:  Molli Knock  Time MD responded:  [phone]

## 2013-04-04 NOTE — Progress Notes (Signed)
ANTIBIOTIC CONSULT NOTE - FOLLOW UP  Pharmacy Consult for vancomycin Indication: r/o sepsis/PNA  Labs:  Recent Labs  04/02/13 2345 04/03/13 0008 04/03/13 0415 04/03/13 1538 04/04/13 0440  WBC 7.3  --  7.6  --  4.5  HGB 11.5* 11.2* 11.5*  --  10.5*  PLT 150  --  120*  --  128*  CREATININE 0.26* 0.40* <0.20* <0.20* <0.20*   CrCl cannot be calculated (Patient has no sCr result on file.).  Recent Labs  04/04/13 0440  VANCORANDOM <5.0     Microbiology: Recent Results (from the past 720 hour(s))  CULTURE, BLOOD (ROUTINE X 2)     Status: None   Collection Time    04/02/13 11:30 PM      Result Value Range Status   Specimen Description Blood LEFT ANTECUBITAL   Final   Special Requests BOTTLES DRAWN AEROBIC AND ANAEROBIC 5CC   Final   Culture NO GROWTH <24 HRS   Final   Report Status PENDING   Incomplete  CULTURE, BLOOD (ROUTINE X 2)     Status: None   Collection Time    04/02/13 11:45 PM      Result Value Range Status   Specimen Description Blood BRACHIAL ARTERY   Final   Special Requests BOTTLES DRAWN AEROBIC ONLY 5CC DR,OPITZ   Final   Culture NO GROWTH <24 HRS   Final   Report Status PENDING   Incomplete  URINE CULTURE     Status: None   Collection Time    04/03/13 12:50 AM      Result Value Range Status   Specimen Description URINE, CATHETERIZED   Final   Special Requests NONE   Final   Culture  Setup Time     Final   Value: 04/03/2013 03:10     Performed at [facility] Count     Final   Value: 40,000 COLONIES/ML     Performed at [facility]   Culture     Final   Value: Multiple bacterial morphotypes present, none predominant. Suggest appropriate recollection if clinically indicated.     Performed at [facility]   Report Status 04/04/2013 FINAL   Final  MRSA PCR SCREENING     Status: None   Collection Time    04/03/13  3:18 AM      Result Value Range Status   MRSA by PCR NEGATIVE  NEGATIVE Final   Comment:          The GeneXpert MRSA Assay (FDA     approved for NASAL specimens     only), is one component of a     comprehensive MRSA colonization     surveillance program. It is not     intended to diagnose MRSA     infection nor to guide or     monitor treatment for     MRSA infections.    Anti-infectives   Start     Dose/Rate Route Frequency Ordered Stop   04/04/13 0600  vancomycin (VANCOCIN) IVPB 750 mg/150 ml premix     750 mg 150 mL/hr over 60 Minutes Intravenous Every 12 hours 04/04/13 0532     04/04/13 0400  ceFEPIme (MAXIPIME) 1 g in dextrose 5 % 50 mL IVPB     1 g 100 mL/hr over 30 Minutes Intravenous Every 24 hours 04/03/13 1445     04/04/13 0000  ceFEPIme (MAXIPIME) 2 g in dextrose 5 % 50 mL IVPB  Status:  Discontinued     2 g 100 mL/hr over 30 Minutes Intravenous Every 24 hours 04/03/13 0334 04/03/13 1445   04/03/13 0400  vancomycin (VANCOCIN) 500 mg in sodium chloride 0.9 % 100 mL IVPB  Status:  Discontinued     500 mg 100 mL/hr over 60 Minutes Intravenous Every 24 hours 04/03/13 0334 04/03/13 2222   04/03/13 0315  ceFEPIme (MAXIPIME) 2 g in dextrose 5 % 50 mL IVPB     2 g 100 mL/hr over 30 Minutes Intravenous  Once 04/03/13 0304 04/03/13 0445   04/03/13 0315  vancomycin (VANCOCIN) IVPB 1000 mg/200 mL premix  Status:  Discontinued     1,000 mg 200 mL/hr over 60 Minutes Intravenous  Once 04/03/13 0304 04/03/13 0304   04/03/13 0000  vancomycin (VANCOCIN) IVPB 1000 mg/200 mL premix     1,000 mg 200 mL/hr over 60 Minutes Intravenous  Once 04/02/13 2354 04/03/13 0135   04/03/13 0000  piperacillin-tazobactam (ZOSYN) IVPB 3.375 g     3.375 g 12.5 mL/hr over 240 Minutes Intravenous  Once 04/02/13 2354 04/03/13 0136      Assessment: 62yo female w/ low body mass was given vancomycin 1500mg  load yesterday am; this am random level is undetectable; assume good renal function for now.  Goal of Therapy:  Vancomycin trough level 15-20 mcg/ml  Plan:  Will begin vancomycin 750mg  IV Q12H  and monitor CBC, Cx, levels prn.  Vernard Gambles, PharmD, BCPS  04/04/2013,5:32 AM

## 2013-04-04 NOTE — Progress Notes (Signed)
eLink Physician-Brief Progress Note Patient Name: [name] Andersen DOB: 1950-11-26 MRN: [id number]  Date of Service  04/04/2013   HPI/Events of Note   Acute respiratory acidosis, likely over sedated.  eICU Interventions  Increased RR to 16 with f/u ABG, rounding MD to address sedation.      Jyllian Haynie 04/04/2013, 4:14 AM
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
## 2013-04-05 ENCOUNTER — Inpatient Hospital Stay (HOSPITAL_COMMUNITY): Payer: BC Managed Care – PPO

## 2013-04-05 LAB — PHOSPHORUS: Phosphorus: 2.5 mg/dL (ref 2.3–4.6)

## 2013-04-05 LAB — BLOOD GAS, ARTERIAL
Acid-Base Excess: 12.9 mmol/L — ABNORMAL HIGH (ref 0.0–2.0)
Bicarbonate: 38.4 mEq/L — ABNORMAL HIGH (ref 20.0–24.0)
FIO2: 0.4 %
MECHVT: 450 mL
O2 Saturation: 99 %
PEEP: 5 cmH2O
Patient temperature: 97.4
RATE: 10 resp/min
TCO2: 40.3 mmol/L (ref 0–100)
pH, Arterial: 7.402 (ref 7.350–7.450)

## 2013-04-05 LAB — GLUCOSE, CAPILLARY
Glucose-Capillary: 97 mg/dL (ref 70–99)
Glucose-Capillary: 189 mg/dL — ABNORMAL HIGH (ref 70–99)
Glucose-Capillary: 93 mg/dL (ref 70–99)

## 2013-04-05 LAB — CBC
Hemoglobin: 10.9 g/dL — ABNORMAL LOW (ref 12.0–15.0)
MCH: 28.9 pg (ref 26.0–34.0)
MCV: 85.4 fL (ref 78.0–100.0)
RBC: 3.77 MIL/uL — ABNORMAL LOW (ref 3.87–5.11)
WBC: 5.2 10*3/uL (ref 4.0–10.5)

## 2013-04-05 LAB — CULTURE, RESPIRATORY

## 2013-04-05 LAB — POCT I-STAT 3, ART BLOOD GAS (G3+)
Bicarbonate: 39.7 mEq/L — ABNORMAL HIGH (ref 20.0–24.0)
O2 Saturation: 95 %
TCO2: 42 mmol/L (ref 0–100)
pH, Arterial: 7.342 — ABNORMAL LOW (ref 7.350–7.450)

## 2013-04-05 LAB — CULTURE, RESPIRATORY W GRAM STAIN

## 2013-04-05 LAB — BASIC METABOLIC PANEL
BUN: 3 mg/dL — ABNORMAL LOW (ref 6–23)
CO2: 39 mEq/L — ABNORMAL HIGH (ref 19–32)
Chloride: 98 mEq/L (ref 96–112)
Glucose, Bld: 87 mg/dL (ref 70–99)
Potassium: 3.1 mEq/L — ABNORMAL LOW (ref 3.5–5.1)
Sodium: 141 mEq/L (ref 135–145)

## 2013-04-05 MED ORDER — ONDANSETRON HCL 4 MG/2ML IJ SOLN
4.0000 mg | Freq: Three times a day (TID) | INTRAMUSCULAR | Status: DC | PRN
  Administered 2013-04-05 (×2): 2 mg via INTRAVENOUS
  Administered 2013-04-05 – 2013-04-21 (×3): 4 mg via INTRAVENOUS
  Filled 2013-04-05 (×5): qty 2

## 2013-04-05 MED ORDER — POTASSIUM CHLORIDE 20 MEQ/15ML (10%) PO LIQD
30.0000 meq | ORAL | Status: DC
  Administered 2013-04-05: 30 meq
  Filled 2013-04-05 (×2): qty 30

## 2013-04-05 MED ORDER — POTASSIUM CHLORIDE 10 MEQ/50ML IV SOLN
10.0000 meq | INTRAVENOUS | Status: AC
  Administered 2013-04-05 (×3): 10 meq via INTRAVENOUS
  Filled 2013-04-05 (×3): qty 50

## 2013-04-05 MED ORDER — LORAZEPAM 2 MG/ML IJ SOLN
0.5000 mg | Freq: Four times a day (QID) | INTRAMUSCULAR | Status: DC | PRN
  Filled 2013-04-05: qty 1

## 2013-04-05 MED ORDER — FENTANYL CITRATE 0.05 MG/ML IJ SOLN
12.5000 ug | INTRAMUSCULAR | Status: DC | PRN
  Administered 2013-04-05 – 2013-04-06 (×2): 12.5 ug via INTRAVENOUS
  Filled 2013-04-05 (×2): qty 2

## 2013-04-05 MED ORDER — ONDANSETRON HCL 4 MG/2ML IJ SOLN
4.0000 mg | Freq: Once | INTRAMUSCULAR | Status: AC
  Administered 2013-04-05: 4 mg via INTRAVENOUS

## 2013-04-05 MED ORDER — LORAZEPAM 2 MG/ML IJ SOLN: 0.5000 mg | Freq: Every evening | INTRAMUSCULAR | Status: DC | PRN

## 2013-04-05 MED ORDER — FENTANYL CITRATE 0.05 MG/ML IJ SOLN
INTRAMUSCULAR | Status: AC
  Administered 2013-04-05: 12.5 ug via INTRAVENOUS
  Filled 2013-04-05: qty 2

## 2013-04-05 NOTE — Progress Notes (Signed)
SPEECH PATHOLOGY/DYSPHAGIA NOTE:15:30-15:45  Returned per family's request.  Pt anxious about swallowing meds, stating "I don't want to die this way."  Reassured pt that her swallow function was sufficient to consume her medication.  Observed pt ingesting medication, provided in liquid form with applesauce.  There were no signs of aspiration.  Assured pt SLP services will return next date for f/u and that prognosis for improved swallow is quite good.  Charlette Hennings L. Samson Frederic, [place] CCC/SLP Pager [phone]

## 2013-04-05 NOTE — Progress Notes (Signed)
CRITICAL VALUE ALERT  Critical value received:  CO2 on ABG 62.4  Date of notification:  04/05/13  Time of notification:  0414  Critical value read back:yes  Nurse who received alert:  Crist Fat RN  MD notified (1st page):  Dr. Darrick Penna in elink  Time of first page:  0440  MD notified (2nd page):NA  Time of second page:NA  Responding MD:  Dr Lanora Manis Deterding  Time MD responded:  [phone]

## 2013-04-05 NOTE — Progress Notes (Addendum)
Wasted 35cc's fentanyl from infusion (discontinued) into sink.   Delynn Flavin, RN  Witnessed this waste  Jacqulyn Cane RN, BSN, CCRN

## 2013-04-05 NOTE — Progress Notes (Signed)
eLink Physician-Brief Progress Note Patient Name: [name] Andersen DOB: 11-17-1950 MRN: [id number]  Date of Service  04/05/2013   HPI/Events of Note  Nausea   eICU Interventions  Plan: PRN zofran    Intervention Category Minor Interventions: Routine modifications to care plan (e.g. PRN medications for pain, fever)  DETERDING,ELIZABETH 04/05/2013, 3:42 AM
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
## 2013-04-05 NOTE — Progress Notes (Signed)
[facility] ADULT ICU REPLACEMENT PROTOCOL FOR AM LAB REPLACEMENT ONLY  The patient does apply for the [facility] Adult ICU Electrolyte Replacment Protocol based on the criteria listed below:   1. Is GFR >/= 40 ml/min? yes  Patient's GFR today is >90 2. Is urine output >/= 0.5 ml/kg/hr for the last 6 hours? yes Patient's UOP is 1.5 ml/kg/hr 3. Is BUN < 60 mg/dL? yes  Patient's BUN today is 3 4. Abnormal electrolyte(s): K 3.1 5. Ordered repletion with: per protocol 6. If a panic level lab has been reported, has the CCM MD in charge been notified? yes.   Physician:  Rollene Rotunda 04/05/2013 5:40 AM
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
## 2013-04-05 NOTE — Procedures (Signed)
Extubation Procedure Note  Patient Details:   Name: [name] Andersen DOB: 08-11-1950 MRN: [id number]    Pt extubated to 4L [place], VS WNL. No stridor noted, pt tolerating well at this time, RT will continue to monitor.    Evaluation  O2 sats: stable throughout Complications: No apparent complications Patient did tolerate procedure well. Bilateral Breath Sounds: Pleural rub;Rales   Yes  Harley Hallmark 04/05/2013, 9:37 AM

## 2013-04-06 ENCOUNTER — Inpatient Hospital Stay (HOSPITAL_COMMUNITY): Payer: BC Managed Care – PPO

## 2013-04-06 DIAGNOSIS — G8250 Quadriplegia, unspecified: Secondary | ICD-10-CM

## 2013-04-06 DIAGNOSIS — E43 Unspecified severe protein-calorie malnutrition: Secondary | ICD-10-CM

## 2013-04-06 DIAGNOSIS — A419 Sepsis, unspecified organism: Principal | ICD-10-CM

## 2013-04-06 LAB — POCT I-STAT 3, ART BLOOD GAS (G3+)
Acid-Base Excess: 14 mmol/L — ABNORMAL HIGH (ref 0.0–2.0)
O2 Saturation: 100 %
Patient temperature: 97.7
TCO2: 44 mmol/L (ref 0–100)
pCO2 arterial: 63.7 mmHg (ref 35.0–45.0)

## 2013-04-06 LAB — BASIC METABOLIC PANEL
BUN: <3 mg/dL — ABNORMAL LOW (ref 6–23)
CO2: 41 mEq/L (ref 19–32)
Chloride: 96 mEq/L (ref 96–112)
Creatinine, Ser: 0.2 mg/dL — ABNORMAL LOW (ref 0.50–1.10)
GFR calc non Af Amer: >90 mL/min (ref >90)
Glucose, Bld: 77 mg/dL (ref 70–99)
Potassium: 3.7 mEq/L (ref 3.5–5.1)
Sodium: 140 mEq/L (ref 135–145)

## 2013-04-06 LAB — CBC
HCT: 32 % — ABNORMAL LOW (ref 36.0–46.0)
Hemoglobin: 10.5 g/dL — ABNORMAL LOW (ref 12.0–15.0)
MCH: 28.7 pg (ref 26.0–34.0)
MCHC: 32.8 g/dL (ref 30.0–36.0)
MCV: 87.4 fL (ref 78.0–100.0)
Platelets: 168 10*3/uL (ref 150–400)
RBC: 3.66 MIL/uL — ABNORMAL LOW (ref 3.87–5.11)

## 2013-04-06 LAB — GLUCOSE, CAPILLARY
Glucose-Capillary: 84 mg/dL (ref 70–99)
Glucose-Capillary: 103 mg/dL — ABNORMAL HIGH (ref 70–99)
Glucose-Capillary: 94 mg/dL (ref 70–99)
Glucose-Capillary: 199 mg/dL — ABNORMAL HIGH (ref 70–99)

## 2013-04-06 MED ORDER — SODIUM CHLORIDE 0.9 % IV SOLN
25.0000 ug/h | INTRAVENOUS | Status: DC
  Administered 2013-04-06: 25 ug/h via INTRAVENOUS
  Administered 2013-04-09: 40 ug/h via INTRAVENOUS
  Filled 2013-04-06 (×3): qty 50

## 2013-04-06 MED ORDER — FENTANYL BOLUS VIA INFUSION
25.0000 ug | Freq: Four times a day (QID) | INTRAVENOUS | Status: DC | PRN
  Administered 2013-04-07 – 2013-04-08 (×3): 25 ug via INTRAVENOUS
  Administered 2013-04-08: 50 ug via INTRAVENOUS
  Filled 2013-04-06: qty 100

## 2013-04-06 MED ORDER — PANTOPRAZOLE SODIUM 40 MG IV SOLR
40.0000 mg | INTRAVENOUS | Status: DC
  Administered 2013-04-06 – 2013-04-08 (×3): 40 mg via INTRAVENOUS
  Filled 2013-04-06 (×3): qty 40

## 2013-04-06 MED ORDER — BACLOFEN 20 MG PO TABS
40.0000 mg | ORAL_TABLET | Freq: Three times a day (TID) | ORAL | Status: DC
  Administered 2013-04-06: 40 mg via ORAL
  Filled 2013-04-06 (×3): qty 2

## 2013-04-06 MED ORDER — ALPRAZOLAM 0.25 MG PO TABS
1.0000 mg | ORAL_TABLET | Freq: Every evening | ORAL | Status: DC | PRN
  Administered 2013-04-06 – 2013-04-18 (×11): 1 mg
  Filled 2013-04-06: qty 4
  Filled 2013-04-06 (×2): qty 2
  Filled 2013-04-06: qty 4
  Filled 2013-04-06 (×4): qty 2
  Filled 2013-04-06: qty 4
  Filled 2013-04-06: qty 2
  Filled 2013-04-06 (×2): qty 4
  Filled 2013-04-06 (×2): qty 2

## 2013-04-06 MED ORDER — GUAIFENESIN ER 600 MG PO TB12
1200.0000 mg | ORAL_TABLET | Freq: Two times a day (BID) | ORAL | Status: DC
  Filled 2013-04-06: qty 2

## 2013-04-06 MED ORDER — ALPRAZOLAM 0.5 MG PO TABS: 1.5000 mg | ORAL_TABLET | Freq: Every day | ORAL | Status: DC

## 2013-04-06 MED ORDER — MIDAZOLAM HCL 2 MG/2ML IJ SOLN
INTRAMUSCULAR | Status: AC
  Filled 2013-04-06: qty 4

## 2013-04-06 MED ORDER — MIDAZOLAM HCL 2 MG/2ML IJ SOLN
1.0000 mg | INTRAMUSCULAR | Status: DC | PRN
  Administered 2013-04-06: 2 mg via INTRAVENOUS

## 2013-04-06 MED ORDER — GABAPENTIN 600 MG PO TABS
600.0000 mg | ORAL_TABLET | ORAL | Status: DC
  Administered 2013-04-06 – 2013-04-26 (×59): 600 mg
  Filled 2013-04-06 (×68): qty 1

## 2013-04-06 MED ORDER — ETOMIDATE 2 MG/ML IV SOLN
16.5000 mg | INTRAVENOUS | Status: AC | PRN
  Administered 2013-04-06: 20 mg via INTRAVENOUS

## 2013-04-06 MED ORDER — TEMAZEPAM 15 MG PO CAPS: 30.0000 mg | ORAL_CAPSULE | Freq: Every evening | ORAL | Status: DC | PRN

## 2013-04-06 MED ORDER — LORAZEPAM 2 MG/ML IJ SOLN: 0.5000 mg | INTRAMUSCULAR | Status: DC | PRN

## 2013-04-06 MED ORDER — CHLORHEXIDINE GLUCONATE 0.12 % MT SOLN
15.0000 mL | Freq: Two times a day (BID) | OROMUCOSAL | Status: DC
  Administered 2013-04-06 – 2013-04-28 (×36): 15 mL via OROMUCOSAL
  Filled 2013-04-06 (×47): qty 15

## 2013-04-06 MED ORDER — BACLOFEN 20 MG PO TABS
40.0000 mg | ORAL_TABLET | Freq: Three times a day (TID) | ORAL | Status: DC
  Administered 2013-04-06 – 2013-04-26 (×60): 40 mg
  Filled 2013-04-06 (×66): qty 2

## 2013-04-06 MED ORDER — VECURONIUM BROMIDE 10 MG IV SOLR: 5.5000 mg | INTRAVENOUS | Status: AC | PRN

## 2013-04-06 MED ORDER — ATROPINE SULFATE 1 MG/ML IJ SOLN: 1.0000 mg | INTRAMUSCULAR | Status: AC | PRN

## 2013-04-06 MED ORDER — SUCCINYLCHOLINE CHLORIDE 20 MG/ML IJ SOLN
83.0000 mg | INTRAMUSCULAR | Status: AC | PRN
  Filled 2013-04-06: qty 7.5

## 2013-04-06 MED ORDER — LORAZEPAM 2 MG/ML IJ SOLN
1.0000 mg | INTRAMUSCULAR | Status: DC | PRN
  Administered 2013-04-06: 1 mg via INTRAVENOUS
  Filled 2013-04-06: qty 1

## 2013-04-06 MED ORDER — ALPRAZOLAM ER 0.5 MG PO TB24: 0.5000 mg | ORAL_TABLET | Freq: Every day | ORAL | Status: DC

## 2013-04-06 MED ORDER — GABAPENTIN 300 MG PO CAPS
600.0000 mg | ORAL_CAPSULE | Freq: Three times a day (TID) | ORAL | Status: DC
  Administered 2013-04-06: 600 mg via ORAL
  Filled 2013-04-06 (×5): qty 2

## 2013-04-06 MED ORDER — ROCURONIUM BROMIDE 50 MG/5ML IV SOLN
55.0000 mg | INTRAVENOUS | Status: AC | PRN
  Administered 2013-04-06: 55 mg via INTRAVENOUS
  Filled 2013-04-06: qty 12

## 2013-04-06 MED ORDER — TEMAZEPAM 15 MG PO CAPS
30.0000 mg | ORAL_CAPSULE | Freq: Every evening | ORAL | Status: DC | PRN
  Administered 2013-04-06 – 2013-04-26 (×18): 30 mg
  Filled 2013-04-06 (×22): qty 2

## 2013-04-06 MED ORDER — INSULIN ASPART 100 UNIT/ML ~~LOC~~ SOLN: 0.0000 [IU] | SUBCUTANEOUS | Status: DC

## 2013-04-06 MED ORDER — LIDOCAINE HCL (CARDIAC) 20 MG/ML IV SOLN: 83.0000 mg | INTRAVENOUS | Status: AC | PRN

## 2013-04-06 MED ORDER — GABAPENTIN 600 MG PO TABS
600.0000 mg | ORAL_TABLET | ORAL | Status: DC
  Administered 2013-04-06: 600 mg via ORAL
  Filled 2013-04-06 (×3): qty 1

## 2013-04-06 MED ORDER — NOREPINEPHRINE BITARTRATE 1 MG/ML IJ SOLN
2.0000 ug/min | INTRAMUSCULAR | Status: DC
  Administered 2013-04-06: 2 ug/min via INTRAVENOUS
  Filled 2013-04-06 (×2): qty 4
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
## 2013-04-06 NOTE — Progress Notes (Signed)
NUTRITION FOLLOW UP  DOCUMENTATION CODES  Per approved criteria   -Severe malnutrition in the context of chronic illness    Intervention:    Snacks TID between meals to maximize protein intake to support healing.  Provided education and handouts for increasing protein intake.  Nutrition Dx:   Inadequate oral intake now related to recent inability to eat while intubated as evidenced by diet just advanced to regular diet. Ongoing.  Goal:   Intake to meet >90% of estimated nutrition needs. Unmet.  Monitor:   Diet tolerance, PO intake, labs, weight trend.  Assessment:   Patient was extubated on 10/15. Patient with severe depletion of muscle and fat mass due to quadriplegia. Patient with increased nutrition needs to support healing of stage III sacral pressure ulcer. S/P swallow evaluation with SLP this morning. Diet has been advanced to regular with thin liquids.  Patient reports that she eats well at home. She has not been weighed in a long time, but she assumed that she weighed ~95 lb PTA. She is fearful of gaining weight around her belly. Discussed the importance of adequate calorie and protein intake to support wound healing. Patient willing to receive high protein snacks TID between meals, but does not want any PO supplements; she has tried Ensure, Resource, [employer], etc and does not like any of them.  Height: Ht Readings from Last 1 Encounters:  04/03/13 5\' 6"  (1.676 m)    Weight Status:   Wt Readings from Last 1 Encounters:  04/06/13 89 lb 11.6 oz (40.7 kg)  04/03/13  81 lb 2.1 oz (36.8 kg)   Re-estimated needs:  Kcal: 1200-1400 Protein: 65-75 gm Fluid: 1.2-1.4 L  Skin: stage III pressure ulcer to sacrum  Diet Order: General   Intake/Output Summary (Last 24 hours) at 04/06/13 1146 Last data filed at 04/06/13 1100  Gross per 24 hour  Intake 986.67 ml  Output   2060 ml  Net -1073.33 ml    Last BM: 10/15   Labs:   Recent Labs Lab  04/03/13 1538 04/04/13 0440 04/05/13 0408 04/06/13 0500  NA 138 142 141 140  K 4.2 4.1 3.1* 3.7  CL 97 105 98 96  CO2 31 32 39* 41*  BUN 4* <3* 3* <3*  CREATININE <0.20* <0.20* <0.20* 0.20*  CALCIUM 8.2* 7.7* 8.0* 8.3*  MG  --  1.4* 1.8  --   PHOS  --  2.5 2.5  --   GLUCOSE 72 111* 87 77    CBG (last 3)   Recent Labs  04/06/13 0013 04/06/13 0405 04/06/13 0800  GLUCAP 110* 84 103*    Scheduled Meds: . ALPRAZolam  1.5 mg Oral QHS  . antiseptic oral rinse  15 mL Mouth Rinse QID  . baclofen  40 mg Oral TID  . ceFEPime (MAXIPIME) IV  1 g Intravenous Q24H  . chlorhexidine  15 mL Mouth Rinse BID  . feeding supplement (VITAL AF 1.2 CAL)  1,000 mL Per Tube Q24H  . gabapentin  600 mg Oral TID  . heparin  5,000 Units Subcutaneous Q8H  . insulin aspart  2-6 Units Subcutaneous Q4H  . pantoprazole (PROTONIX) IV  40 mg Intravenous Q24H    Continuous Infusions: . sodium chloride Stopped (04/05/13 1144)    Joaquin Courts, RD, LDN, CNSC Pager [phone] After Hours Pager [phone]

## 2013-04-06 NOTE — Procedures (Signed)
Intubation Procedure Note TAMIYAH MOULIN [id number] Oct 29, 1950  Procedure: Intubation Indications: Respiratory insufficiency, possible bronchoscopy for presumed mucous plugging  Procedure Details Consent: Risks of procedure as well as the alternatives and risks of each were explained to the (patient/caregiver).  Consent for procedure obtained. Time Out: Verified patient identification, verified procedure, site/side was marked, verified correct patient position, special equipment/implants available, medications/allergies/relevent history reviewed, required imaging and test results available.  Performed  Maximum sterile technique was used including gloves, gown, hand hygiene and mask.  MAC 3 Grade 1 view 20mg  etomidate, 50mg  rocuronium 7.5 ETT passed through cords without difficulty under direct visualization Position verified with smoke in tube, bilateral breath sounds, chest rise, capnography   Evaluation Hemodynamic Status: Transient hypotension treated with pressors and fluid; O2 sats: transiently fell during during procedure Patient's Current Condition: stable Complications: No apparent complications Patient did tolerate procedure well. Chest X-ray ordered to verify placement.  CXR: pending.  Assisted Dr. Kendrick Fries throughout procedure, who performed the intubation.  Bobbye Morton, MD PGY-2, [facility] 04/06/2013, 8:39 PM  Yolonda Kida PCCM Pager: [phone] Cell: [phone] If no response, call [phone]
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
## 2013-04-06 NOTE — Progress Notes (Addendum)
PCCM PGY-2 Interim Note  Called to bedside by RN/RT for pt with increased rattling throat secretions/congestion, without much response to chest PT and NT suctioning. Pt with sats ~75-80, not improved with suctioning/repositioning or Venti mask. Suctioning did not return much sputum on multiple attempts. Pt alert/aware throughout, responding appropriately to questions/commands. Breath sounds with rhonchi throughout, congestion audible without stethoscope.  [facility] providers also aware. Improved with non-rebreather. Will check stat CXR. Added guaifenisen, though doubt how much this will help. Will continue to monitor. Reintubation a possibility; per previous discussions with family, this is acceptable in the short term, but NO CPR.  Bobbye Morton, MD PGY-2, [facility] 04/06/2013, 7:05 PM PCCM Pager [phone]  Addendum: CXR shows white-out of left lung, likely mucous plugging. Discussed/reviewed with Dr. Kendrick Fries as well as family. Decision made for intubation for bag-lavage and possible bronchoscopy tonight. Hopeful for extubation tomorrow, if pt able to tolerate.  Intubated with CXR for confirmation pending. Some relative hypotension treating with fluids and Levophed. Hopeful to discontinue Levo shortly. Sedation with continuous Fentanyl and Versed PRN (previously used for sedation on this patient). Monitor overnight, repeat CXR in the AM. OG tube to be placed, need to readjust medications to per tube.  Bobbye Morton, MD PGY-2, [facility] 04/06/2013, 8:43 PM

## 2013-04-06 NOTE — Progress Notes (Signed)
Chaplain visited with pt and her two daughters, Denny Peon and Lequita Halt.   Pt was very eager and excited to tell chaplain about birth of first grandchild, pictures were hanging throughout the room. Pt expressed a sense that "she didn't die because she's still here" for grandchild. Pt also says she is "prepared" and "not scared to die." Pt had conversation with daughters about death. Daughters wanted pt to "focus on today and tomorrow and life here" and are "eager to take her home." Family appears able to communicate and support one another. Pt requested prayer for "her family." Chaplain offered emotional/spiritual support, family support, and prayer. All three women were appreciative of the visit.   Maurene Capes, [place] [phone]
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
## 2013-04-06 NOTE — Progress Notes (Signed)
UR Completed.  [place], [place] [phone] 04/06/2013

## 2013-04-06 NOTE — Progress Notes (Signed)
CRITICAL VALUE ALERT  Critical value received:  Serum CO2 = 41   Date of notification:  04/06/13  Time of notification:  0615  Critical value read back:yes  Nurse who received alert:  Corliss Skains, RN  MD notified (1st page):  Dr. Ermalinda Memos, Resident  Time of first page:  [phone]  MD notified (2nd page):  Time of second page:  Responding MD:  Dr. Ermalinda Memos, Resident  Time MD responded: [phone]

## 2013-04-07 ENCOUNTER — Inpatient Hospital Stay (HOSPITAL_COMMUNITY): Payer: BC Managed Care – PPO

## 2013-04-07 LAB — BASIC METABOLIC PANEL
BUN: 5 mg/dL — ABNORMAL LOW (ref 6–23)
CO2: 34 mEq/L — ABNORMAL HIGH (ref 19–32)
Calcium: 7.6 mg/dL — ABNORMAL LOW (ref 8.4–10.5)
Calcium: 7.9 mg/dL — ABNORMAL LOW (ref 8.4–10.5)
Chloride: 98 mEq/L (ref 96–112)
Chloride: 94 mEq/L — ABNORMAL LOW (ref 96–112)
Creatinine, Ser: 0.2 mg/dL — ABNORMAL LOW (ref 0.50–1.10)
Creatinine, Ser: 0.22 mg/dL — ABNORMAL LOW (ref 0.50–1.10)
GFR calc Af Amer: >90 mL/min (ref >90)
GFR calc non Af Amer: >90 mL/min (ref >90)
Glucose, Bld: 86 mg/dL (ref 70–99)
Potassium: 3.6 mEq/L (ref 3.5–5.1)
Sodium: 140 mEq/L (ref 135–145)
Sodium: 137 mEq/L (ref 135–145)

## 2013-04-07 LAB — CBC
HCT: 32.5 % — ABNORMAL LOW (ref 36.0–46.0)
Hemoglobin: 10.8 g/dL — ABNORMAL LOW (ref 12.0–15.0)
MCH: 29 pg (ref 26.0–34.0)
MCV: 87.1 fL (ref 78.0–100.0)
Platelets: 214 10*3/uL (ref 150–400)
RDW: 13.3 % (ref 11.5–15.5)
WBC: 7.2 10*3/uL (ref 4.0–10.5)

## 2013-04-07 LAB — GLUCOSE, CAPILLARY
Glucose-Capillary: 103 mg/dL — ABNORMAL HIGH (ref 70–99)
Glucose-Capillary: 81 mg/dL (ref 70–99)
Glucose-Capillary: 87 mg/dL (ref 70–99)
Glucose-Capillary: 97 mg/dL (ref 70–99)

## 2013-04-07 MED ORDER — POTASSIUM CHLORIDE 10 MEQ/50ML IV SOLN
10.0000 meq | INTRAVENOUS | Status: AC
  Administered 2013-04-07 (×3): 10 meq via INTRAVENOUS
  Filled 2013-04-07 (×3): qty 50

## 2013-04-07 NOTE — Progress Notes (Signed)
PT Cancellation Note  Patient Details Name: [name] Andersen MRN: [id number] DOB: Jun 27, 1950   Cancelled Treatment:    Reason Treat Not Completed: Patient not medically ready. Noted pt intubated last night. Pt/family and I had set goals for today of OOB to w/c for upright tolerance. Spoke with family at bedside re: need to defer PT today.   Requested family bring in pt's electric w/c for pt to use while here and family agreed that she will do best in her own chair. They will arrange to have w/c here when pt medically ready for OOB via lift.   Abeni Finchum 04/07/2013, 9:27 AM Pager [phone]

## 2013-04-07 NOTE — Progress Notes (Signed)
ANTIBIOTIC CONSULT NOTE - FOLLOW UP  Pharmacy Consult for cefepime Indication: suspected HCAP  No Known Allergies  Patient Measurements: Height: 5\' 6"  (167.6 cm) Weight: 89 lb 11.6 oz (40.7 kg) IBW/kg (Calculated) : 59.3   Vital Signs: Temp: 100.8 F (38.2 C) (10/17 0404) Temp src: Oral (10/17 0404) BP: 111/71 mmHg (10/17 1000) Pulse Rate: 74 (10/17 1000) Intake/Output from previous day: 10/16 0701 - 10/17 0700 In: 1516.8 [I.V.:1496.8; NG/GT:20] Out: 2060 [Urine:2060] Intake/Output from this shift: Total I/O In: 588.9 [I.V.:538.9; IV Piggyback:50] Out: 0   Labs:  Recent Labs  04/05/13 0408 04/06/13 0500 04/07/13 0630  WBC 5.2 3.5* 7.2  HGB 10.9* 10.5* 10.8*  PLT 137* 168 214  CREATININE <0.20* 0.20* 0.20*   Estimated Creatinine Clearance: 48 ml/min (by C-G formula based on Cr of 0.2).   Microbiology: Recent Results (from the past 720 hour(s))  CULTURE, BLOOD (ROUTINE X 2)     Status: None   Collection Time    04/02/13 11:30 PM      Result Value Range Status   Specimen Description BLOOD LEFT ANTECUBITAL   Final   Special Requests BOTTLES DRAWN AEROBIC AND ANAEROBIC 5CC   Final   Culture NO GROWTH 3 DAYS   Final   Report Status PENDING   Incomplete  CULTURE, BLOOD (ROUTINE X 2)     Status: None   Collection Time    04/02/13 11:45 PM      Result Value Range Status   Specimen Description BLOOD LEFT BRACHIAL COLLECTED BY DOCTOR OPITZ   Final   Special Requests BOTTLES DRAWN AEROBIC ONLY 5CC   Final   Culture NO GROWTH 3 DAYS   Final   Report Status PENDING   Incomplete  URINE CULTURE     Status: None   Collection Time    04/03/13 12:50 AM      Result Value Range Status   Specimen Description URINE, CATHETERIZED   Final   Special Requests NONE   Final   Culture  Setup Time     Final   Value: 04/03/2013 03:10     Performed at [facility] Count     Final   Value: 40,000 COLONIES/ML     Performed at [facility]   Culture      Final   Value: Multiple bacterial morphotypes present, none predominant. Suggest appropriate recollection if clinically indicated.     Performed at [facility]   Report Status 04/04/2013 FINAL   Final  MRSA PCR SCREENING     Status: None   Collection Time    04/03/13  3:18 AM      Result Value Range Status   MRSA by PCR NEGATIVE  NEGATIVE Final   Comment:            The GeneXpert MRSA Assay (FDA     approved for NASAL specimens     only), is one component of a     comprehensive MRSA colonization     surveillance program. It is not     intended to diagnose MRSA     infection nor to guide or     monitor treatment for     MRSA infections.  CULTURE, BLOOD (ROUTINE X 2)     Status: None   Collection Time    04/03/13  3:50 AM      Result Value Range Status   Specimen Description BLOOD RIGHT HAND   Final   Special Requests  BOTTLES DRAWN AEROBIC ONLY 10CC   Final   Culture  Setup Time     Final   Value: 04/03/2013 09:13     Performed at [facility]   Culture     Final   Value:        BLOOD CULTURE RECEIVED NO GROWTH TO DATE CULTURE WILL BE HELD FOR 5 DAYS BEFORE ISSUING A FINAL NEGATIVE REPORT     Performed at [facility]   Report Status PENDING   Incomplete  CULTURE, BLOOD (ROUTINE X 2)     Status: None   Collection Time    04/03/13  4:00 AM      Result Value Range Status   Specimen Description BLOOD LEFT HAND   Final   Special Requests BOTTLES DRAWN AEROBIC ONLY 5CC   Final   Culture  Setup Time     Final   Value: 04/03/2013 09:13     Performed at [facility]   Culture     Final   Value:        BLOOD CULTURE RECEIVED NO GROWTH TO DATE CULTURE WILL BE HELD FOR 5 DAYS BEFORE ISSUING A FINAL NEGATIVE REPORT     Performed at [facility]   Report Status PENDING   Incomplete  CULTURE, RESPIRATORY (NON-EXPECTORATED)     Status: None   Collection Time    04/03/13  6:15 AM      Result Value Range Status   Specimen Description ENDOTRACHEAL  ASPIRATE   Final   Special Requests NONE   Final   Gram Stain     Final   Value: MODERATE WBC PRESENT,BOTH PMN AND MONONUCLEAR     NO SQUAMOUS EPITHELIAL CELLS SEEN     NO ORGANISMS SEEN     Performed at [facility]   Culture     Final   Value: Non-Pathogenic Oropharyngeal-type Flora Isolated.     Performed at [facility]   Report Status 04/05/2013 FINAL   Final    Anti-infectives   Start     Dose/Rate Route Frequency Ordered Stop   04/04/13 0600  vancomycin (VANCOCIN) IVPB 750 mg/150 ml premix  Status:  Discontinued     750 mg 150 mL/hr over 60 Minutes Intravenous Every 12 hours 04/04/13 0532 04/05/13 0925   04/04/13 0400  ceFEPIme (MAXIPIME) 1 g in dextrose 5 % 50 mL IVPB     1 g 100 mL/hr over 30 Minutes Intravenous Every 24 hours 04/03/13 1445     04/04/13 0000  ceFEPIme (MAXIPIME) 2 g in dextrose 5 % 50 mL IVPB  Status:  Discontinued     2 g 100 mL/hr over 30 Minutes Intravenous Every 24 hours 04/03/13 0334 04/03/13 1445   04/03/13 0400  vancomycin (VANCOCIN) 500 mg in sodium chloride 0.9 % 100 mL IVPB  Status:  Discontinued     500 mg 100 mL/hr over 60 Minutes Intravenous Every 24 hours 04/03/13 0334 04/03/13 2222   04/03/13 0315  ceFEPIme (MAXIPIME) 2 g in dextrose 5 % 50 mL IVPB     2 g 100 mL/hr over 30 Minutes Intravenous  Once 04/03/13 0304 04/03/13 0445   04/03/13 0315  vancomycin (VANCOCIN) IVPB 1000 mg/200 mL premix  Status:  Discontinued     1,000 mg 200 mL/hr over 60 Minutes Intravenous  Once 04/03/13 0304 04/03/13 0304   04/03/13 0000  vancomycin (VANCOCIN) IVPB 1000 mg/200 mL premix     1,000 mg 200 mL/hr over  60 Minutes Intravenous  Once 04/02/13 2354 04/03/13 0135   04/03/13 0000  piperacillin-tazobactam (ZOSYN) IVPB 3.375 g     3.375 g 12.5 mL/hr over 240 Minutes Intravenous  Once 04/02/13 2354 04/03/13 0136     Assessment: 62 YOF who is quadriplegic who continues on cefepime for suspected HCAP. Her renal function is difficult to  determine d/t very low body mass and quadriplegia. SCr is 0.2 and likely CrCl is <45mL/min. She was reintubated yesterday, but likely will be extubated again soon. WBC WNL, Tmax 100.8/24hr.  Goal of Therapy:  proper dosing for renal function  Plan:  1. Continue cefepime 1g IV Q24h 2. Per CCM team, likely planning for 10 days total- follow up LOT and stop date entered 3. Follow up clinical progression, any drastic change in renal function  Dara Camargo D. Barrett Goldie, PharmD Clinical Pharmacist Pager: [phone] 04/07/2013 10:30 AM

## 2013-04-07 NOTE — Progress Notes (Signed)
Chaplain visited with pt, pt's daughter Lequita Halt, and pt's sister Corrie Dandy.   Pt and family are teary today. Pt able to communicate some with breathing tube. She said she is "tired" and "ready to go to heaven," but "doesn't want to leave family." Pt and family beginning to discuss future medical decisions and limitations of care. Chaplain gave pt and family documentation for completing an Advance Directive/HPA/Living Will.   Chaplain provided emotional and spiritual support, empathic listening, and information on Advance Directives. Will follow up.   Maurene Capes, [place] [phone]
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
## 2013-04-07 NOTE — Progress Notes (Signed)
NUTRITION FOLLOW UP  DOCUMENTATION CODES  Per approved criteria   -Severe malnutrition in the context of chronic illness    Intervention:    If unable to extubate patient within the next 24 hours, recommend initiate TF via OGT with Vital AF 1.2 at 15 ml/h, increase by 10 ml every 4 hours to goal rate of 45 to provide 1296 kcals, 81 gm protein, 876 ml free water daily.  Nutrition Dx:   Inadequate oral intake related to inability to eat while intubated as evidenced by NPO status. Ongoing.  Goal:   Intake to meet >90% of estimated nutrition needs. Unmet.  Monitor:   Vent status, diet advancement and tolerance after extubation, labs, weight trend.  Assessment:   Patient was extubated on 10/15. Had to be re-intubated last night due to mucous plugging of left lung. Now clear per RN with hopes to extubate tonight or tomorrow morning.   Patient with severe depletion of muscle and fat mass due to quadriplegia. Patient with increased nutrition needs to support healing of stage III sacral pressure ulcer.   Patient is currently intubated on ventilator support.  MV: 7.1 L/min Temp:Temp (24hrs), Avg:98.7 F (37.1 C), Min:97.6 F (36.4 C), Max:100.8 F (38.2 C)   Height: Ht Readings from Last 1 Encounters:  04/03/13 5\' 6"  (1.676 m)    Weight Status:   Wt Readings from Last 1 Encounters:  04/07/13 89 lb 11.6 oz (40.7 kg)  04/06/13  89 lb 11.6 oz (40.7 kg)  04/03/13  81 lb 2.1 oz (36.8 kg)   Re-estimated needs:  Kcal: 1345 Protein: 65-75 gm Fluid: 1.3-1.4 L  Skin: stage III pressure ulcer to sacrum  Diet Order: NPO   Intake/Output Summary (Last 24 hours) at 04/07/13 1421 Last data filed at 04/07/13 1400  Gross per 24 hour  Intake 1818.04 ml  Output   1880 ml  Net -61.96 ml    Last BM: 10/15   Labs:   Recent Labs Lab 04/03/13 1538 04/04/13 0440 04/05/13 0408 04/06/13 0500 04/07/13 0630  NA 138 142 141 140 140  K 4.2 4.1 3.1* 3.7 3.3*  CL 97 105 98 96 98  CO2  31 32 39* 41* 38*  BUN 4* <3* 3* <3* 5*  CREATININE <0.20* <0.20* <0.20* 0.20* 0.20*  CALCIUM 8.2* 7.7* 8.0* 8.3* 7.6*  MG  --  1.4* 1.8  --   --   PHOS  --  2.5 2.5  --   --   GLUCOSE 72 111* 87 77 95    CBG (last 3)   Recent Labs  04/07/13 0403 04/07/13 0810 04/07/13 1157  GLUCAP 87 103* 94    Scheduled Meds: . antiseptic oral rinse  15 mL Mouth Rinse QID  . baclofen  40 mg Per Tube TID  . ceFEPime (MAXIPIME) IV  1 g Intravenous Q24H  . chlorhexidine  15 mL Mouth Rinse BID  . gabapentin  600 mg Per Tube Custom  . heparin  5,000 Units Subcutaneous Q8H  . insulin aspart  0-9 Units Subcutaneous Q4H  . pantoprazole (PROTONIX) IV  40 mg Intravenous Q24H    Continuous Infusions: . sodium chloride 100 mL/hr at 04/07/13 0819  . fentaNYL infusion INTRAVENOUS 25 mcg/hr (04/06/13 2102)  . norepinephrine (LEVOPHED) Adult infusion Stopped (04/07/13 1145)    Joaquin Courts, RD, LDN, CNSC Pager [phone] After Hours Pager [phone]

## 2013-04-08 ENCOUNTER — Inpatient Hospital Stay (HOSPITAL_COMMUNITY): Payer: BC Managed Care – PPO

## 2013-04-08 DIAGNOSIS — J9811 Atelectasis: Secondary | ICD-10-CM

## 2013-04-08 LAB — CULTURE, BLOOD (ROUTINE X 2): Culture: NO GROWTH

## 2013-04-08 LAB — GLUCOSE, CAPILLARY
Glucose-Capillary: 64 mg/dL — ABNORMAL LOW (ref 70–99)
Glucose-Capillary: 125 mg/dL — ABNORMAL HIGH (ref 70–99)
Glucose-Capillary: 93 mg/dL (ref 70–99)
Glucose-Capillary: 84 mg/dL (ref 70–99)

## 2013-04-08 LAB — BASIC METABOLIC PANEL
BUN: 6 mg/dL (ref 6–23)
Calcium: 7.8 mg/dL — ABNORMAL LOW (ref 8.4–10.5)
Creatinine, Ser: <0.2 mg/dL — ABNORMAL LOW (ref 0.50–1.10)
Glucose, Bld: 65 mg/dL — ABNORMAL LOW (ref 70–99)
Sodium: 142 mEq/L (ref 135–145)

## 2013-04-08 LAB — CBC
HCT: 31.3 % — ABNORMAL LOW (ref 36.0–46.0)
Hemoglobin: 10.7 g/dL — ABNORMAL LOW (ref 12.0–15.0)
MCH: 29.2 pg (ref 26.0–34.0)
MCHC: 34.2 g/dL (ref 30.0–36.0)
RBC: 3.67 MIL/uL — ABNORMAL LOW (ref 3.87–5.11)
RDW: 13 % (ref 11.5–15.5)

## 2013-04-08 MED ORDER — PANTOPRAZOLE SODIUM 40 MG PO PACK
40.0000 mg | PACK | Freq: Every day | ORAL | Status: DC
  Administered 2013-04-09: 40 mg
  Filled 2013-04-08 (×3): qty 20

## 2013-04-08 MED ORDER — VITAL AF 1.2 CAL PO LIQD
1000.0000 mL | ORAL | Status: DC
  Administered 2013-04-08 – 2013-04-09 (×2): 1000 mL
  Filled 2013-04-08 (×4): qty 1000

## 2013-04-08 MED ORDER — DEXTROSE 50 % IV SOLN
INTRAVENOUS | Status: AC
  Filled 2013-04-08: qty 50

## 2013-04-08 MED ORDER — ALBUTEROL SULFATE (5 MG/ML) 0.5% IN NEBU
2.5000 mg | INHALATION_SOLUTION | Freq: Four times a day (QID) | RESPIRATORY_TRACT | Status: DC
  Administered 2013-04-08 – 2013-04-14 (×20): 2.5 mg via RESPIRATORY_TRACT
  Filled 2013-04-08 (×23): qty 0.5

## 2013-04-08 MED ORDER — FREE WATER
100.0000 mL | Freq: Three times a day (TID) | Status: DC
  Administered 2013-04-08 – 2013-04-10 (×6): 100 mL

## 2013-04-08 MED ORDER — POTASSIUM CHLORIDE 20 MEQ/15ML (10%) PO LIQD
30.0000 meq | ORAL | Status: AC
  Administered 2013-04-08 (×2): 30 meq
  Filled 2013-04-08 (×2): qty 30

## 2013-04-08 MED ORDER — DEXTROSE 50 % IV SOLN
25.0000 mL | Freq: Once | INTRAVENOUS | Status: AC | PRN
  Administered 2013-04-08: 25 mL via INTRAVENOUS

## 2013-04-08 MED ORDER — JEVITY 1.2 CAL PO LIQD
1000.0000 mL | ORAL | Status: DC
  Filled 2013-04-08: qty 1000

## 2013-04-08 MED ORDER — ALBUTEROL SULFATE (5 MG/ML) 0.5% IN NEBU
2.5000 mg | INHALATION_SOLUTION | RESPIRATORY_TRACT | Status: DC | PRN
  Administered 2013-04-08: 2.5 mg via RESPIRATORY_TRACT
  Filled 2013-04-08: qty 0.5

## 2013-04-08 NOTE — Progress Notes (Signed)
[facility] ADULT ICU REPLACEMENT PROTOCOL FOR AM LAB REPLACEMENT ONLY  The patient does apply for the [facility] Adult ICU Electrolyte Replacment Protocol based on the criteria listed below:   1. Is GFR >/= 40 ml/min? yes  Patient's GFR today is >90 2. Is urine output >/= 0.5 ml/kg/hr for the last 6 hours? yes Patient's UOP is 4.87 ml/kg/hr 3. Is BUN < 60 mg/dL? yes  Patient's BUN today is 6 4. Abnormal electrolyte(s): K3.1 5. Ordered repletion with: per protocol 6. If a panic level lab has been reported, has the CCM MD in charge been notified? yes.   Physician:  Doylene Bode McEachran 04/08/2013 5:08 AM

## 2013-04-08 NOTE — Progress Notes (Signed)
NUTRITION FOLLOW UP  DOCUMENTATION CODES  Per approved criteria   -Severe malnutrition in the context of chronic illness    Intervention:    Recommend increasing TF of Jevity 1.2 at 20ml by 10ml every 4 hours to goal of 1ml/hr with Prostat 30ml daily which will provide 1396 calories, 75g protein, free water and meet 104% estimated calorie needs, 100% estimated protein needs  Unit RD to continue to monitor   Nutrition Dx:   Inadequate oral intake related to inability to eat while intubated as evidenced by NPO status. Ongoing.  Goal:   Intake to meet >90% of estimated nutrition needs. Unmet.  Monitor:   Vent status, TF tolerance/advancement, labs, weight trend.  Assessment:   Patient was extubated on 10/15. Had to be re-intubated 10/16 to mucous plugging of left lung. No plans for extubation currently.   Patient with severe depletion of muscle and fat mass due to quadriplegia. Patient with increased nutrition needs to support healing of stage III sacral pressure ulcer.   Pt started TF today via OGT of Jevity 1.2 at 66ml/hr which provides 576 calories, 27g protein, free water meeting 43% estimated calorie needs, 41% estimated protein needs  Potassium low - getting replaced, most recent magnesium and phosphorus WNL  Patient is currently intubated on ventilator support.  MV: 6.1 L/min Temp:Temp (24hrs), Avg:98.1 F (36.7 C), Min:97.7 F (36.5 C), Max:98.6 F (37 C)   Height: Ht Readings from Last 1 Encounters:  04/03/13 5\' 6"  (1.676 m)    Weight Status:   Wt Readings from Last 1 Encounters:  04/08/13 83 lb 1.8 oz (37.7 kg)  04/06/13  89 lb 11.6 oz (40.7 kg)  04/03/13  81 lb 2.1 oz (36.8 kg)   Re-estimated needs:  Kcal: 1345 Protein: 65-75 gm Fluid: 1.3-1.4 L  Skin: stage III pressure ulcer to sacrum  Diet Order: NPO   Intake/Output Summary (Last 24 hours) at 04/08/13 1220 Last data filed at 04/08/13 1100  Gross per 24 hour  Intake 2464.79 ml   Output   1860 ml  Net 604.79 ml    Last BM: 10/16   Labs:   Recent Labs Lab 04/03/13 1538 04/04/13 0440 04/05/13 0408  04/07/13 0630 04/07/13 1500 04/08/13 0355  NA 138 142 141  < > 140 137 142  K 4.2 4.1 3.1*  < > 3.3* 3.6 3.1*  CL 97 105 98  < > 98 94* 99  CO2 31 32 39*  < > 38* 34* 31  BUN 4* <3* 3*  < > 5* 5* 6  CREATININE <0.20* <0.20* <0.20*  < > 0.20* 0.22* <0.20*  CALCIUM 8.2* 7.7* 8.0*  < > 7.6* 7.9* 7.8*  MG  --  1.4* 1.8  --   --   --   --   PHOS  --  2.5 2.5  --   --   --   --   GLUCOSE 72 111* 87  < > 95 86 65*  < > = values in this interval not displayed.  CBG (last 3)   Recent Labs  04/08/13 0517 04/08/13 0732 04/08/13 1128  GLUCAP 125* 93 74    Scheduled Meds: . antiseptic oral rinse  15 mL Mouth Rinse QID  . baclofen  40 mg Per Tube TID  . ceFEPime (MAXIPIME) IV  1 g Intravenous Q24H  . chlorhexidine  15 mL Mouth Rinse BID  . gabapentin  600 mg Per Tube Custom  . heparin  5,000 Units Subcutaneous  Q8H  . insulin aspart  0-9 Units Subcutaneous Q4H  . pantoprazole (PROTONIX) IV  40 mg Intravenous Q24H    Continuous Infusions: . sodium chloride 100 mL/hr at 04/07/13 0819  . feeding supplement (JEVITY 1.2 CAL)    . fentaNYL infusion INTRAVENOUS 25 mcg/hr (04/06/13 2102)  . norepinephrine (LEVOPHED) Adult infusion Stopped (04/07/13 2116)    Levon Hedger MS, RD, LDN [phone] Weekend/After Hours Pager

## 2013-04-08 NOTE — Progress Notes (Signed)
Hypoglycemic Event  CBG: 64  Treatment: 1/2 amp d50  Symptoms: none  Follow-up CBG: Time:0510 CBG Result:125  Possible Reasons for Event: npo    [name] Andersen  Remember to initiate Hypoglycemia Order Set & complete
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
## 2013-04-09 ENCOUNTER — Inpatient Hospital Stay (HOSPITAL_COMMUNITY): Payer: BC Managed Care – PPO

## 2013-04-09 DIAGNOSIS — J9819 Other pulmonary collapse: Secondary | ICD-10-CM

## 2013-04-09 LAB — CBC
HCT: 31.4 % — ABNORMAL LOW (ref 36.0–46.0)
Hemoglobin: 10.6 g/dL — ABNORMAL LOW (ref 12.0–15.0)
MCV: 84.2 fL (ref 78.0–100.0)
Platelets: 216 10*3/uL (ref 150–400)
RBC: 3.73 MIL/uL — ABNORMAL LOW (ref 3.87–5.11)
WBC: 5.3 10*3/uL (ref 4.0–10.5)

## 2013-04-09 LAB — CULTURE, BLOOD (ROUTINE X 2): Culture: NO GROWTH

## 2013-04-09 LAB — BASIC METABOLIC PANEL
CO2: 30 mEq/L (ref 19–32)
Chloride: 94 mEq/L — ABNORMAL LOW (ref 96–112)
Creatinine, Ser: <0.2 mg/dL — ABNORMAL LOW (ref 0.50–1.10)
Glucose, Bld: 126 mg/dL — ABNORMAL HIGH (ref 70–99)

## 2013-04-09 LAB — GLUCOSE, CAPILLARY
Glucose-Capillary: 102 mg/dL — ABNORMAL HIGH (ref 70–99)
Glucose-Capillary: 140 mg/dL — ABNORMAL HIGH (ref 70–99)

## 2013-04-09 MED ORDER — SODIUM CHLORIDE 0.9 % IV BOLUS (SEPSIS)
250.0000 mL | Freq: Once | INTRAVENOUS | Status: AC
  Administered 2013-04-09: 250 mL via INTRAVENOUS

## 2013-04-09 MED ORDER — POTASSIUM CHLORIDE 20 MEQ/15ML (10%) PO LIQD
40.0000 meq | Freq: Two times a day (BID) | ORAL | Status: AC
  Administered 2013-04-09 (×2): 40 meq via ORAL
  Filled 2013-04-09 (×2): qty 30

## 2013-04-09 MED ORDER — POTASSIUM CHLORIDE 20 MEQ/15ML (10%) PO LIQD
ORAL | Status: AC
  Filled 2013-04-09: qty 30
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
## 2013-04-09 NOTE — Progress Notes (Signed)
eLink Physician-Brief Progress Note Patient Name: [name] Andersen DOB: 12-27-1950 MRN: [id number]  Date of Service  04/09/2013   HPI/Events of Note   Hypokalemia 2.9  eICU Interventions  KCL 40 meq per tube ordered for 2 doses today.      Deanna Artis 04/09/2013, 6:04 AM

## 2013-04-10 ENCOUNTER — Inpatient Hospital Stay (HOSPITAL_COMMUNITY): Payer: BC Managed Care – PPO

## 2013-04-10 LAB — BASIC METABOLIC PANEL
BUN: 7 mg/dL (ref 6–23)
CO2: 33 mEq/L — ABNORMAL HIGH (ref 19–32)
Calcium: 8.4 mg/dL (ref 8.4–10.5)
Chloride: 97 mEq/L (ref 96–112)
Sodium: 137 mEq/L (ref 135–145)

## 2013-04-10 LAB — GLUCOSE, CAPILLARY
Glucose-Capillary: 120 mg/dL — ABNORMAL HIGH (ref 70–99)
Glucose-Capillary: 138 mg/dL — ABNORMAL HIGH (ref 70–99)

## 2013-04-10 LAB — CBC
HCT: 29.4 % — ABNORMAL LOW (ref 36.0–46.0)
Hemoglobin: 10 g/dL — ABNORMAL LOW (ref 12.0–15.0)
MCH: 29.2 pg (ref 26.0–34.0)
MCV: 86 fL (ref 78.0–100.0)
Platelets: 216 10*3/uL (ref 150–400)
RBC: 3.42 MIL/uL — ABNORMAL LOW (ref 3.87–5.11)
WBC: 4.3 10*3/uL (ref 4.0–10.5)

## 2013-04-10 MED ORDER — JEVITY 1.2 CAL PO LIQD
1000.0000 mL | ORAL | Status: DC
  Administered 2013-04-10: 16:00:00
  Administered 2013-04-11: 40 mL/h
  Administered 2013-04-12: 50 mL/h
  Administered 2013-04-15 – 2013-04-18 (×2): 1000 mL
  Filled 2013-04-10 (×15): qty 1000

## 2013-04-10 MED ORDER — ENOXAPARIN SODIUM 30 MG/0.3ML ~~LOC~~ SOLN
30.0000 mg | SUBCUTANEOUS | Status: DC
  Administered 2013-04-10 – 2013-04-12 (×3): 30 mg via SUBCUTANEOUS
  Filled 2013-04-10 (×4): qty 0.3

## 2013-04-10 MED ORDER — LIDOCAINE VISCOUS 2 % MT SOLN
15.0000 mL | Freq: Once | OROMUCOSAL | Status: AC
  Administered 2013-04-10: 15 mL via OROMUCOSAL
  Filled 2013-04-10: qty 15

## 2013-04-10 MED ORDER — PANTOPRAZOLE SODIUM 40 MG IV SOLR
40.0000 mg | INTRAVENOUS | Status: DC
  Administered 2013-04-10 – 2013-04-11 (×2): 40 mg via INTRAVENOUS
  Filled 2013-04-10 (×2): qty 40

## 2013-04-10 MED ORDER — HALOPERIDOL LACTATE 5 MG/ML IJ SOLN: 1.0000 mg | INTRAMUSCULAR | Status: DC | PRN

## 2013-04-10 MED ORDER — VITAL AF 1.2 CAL PO LIQD
1000.0000 mL | ORAL | Status: DC
  Filled 2013-04-10 (×2): qty 1000

## 2013-04-10 MED ORDER — ENOXAPARIN SODIUM 30 MG/0.3ML ~~LOC~~ SOLN
30.0000 mg | SUBCUTANEOUS | Status: DC
  Filled 2013-04-10: qty 0.3

## 2013-04-10 MED ORDER — MIDAZOLAM HCL 2 MG/2ML IJ SOLN
INTRAMUSCULAR | Status: AC
  Administered 2013-04-10: 1 mg
  Filled 2013-04-10: qty 2

## 2013-04-10 MED ORDER — MIDAZOLAM HCL 2 MG/2ML IJ SOLN: 1.0000 mg | Freq: Once | INTRAMUSCULAR | Status: DC

## 2013-04-10 NOTE — Procedures (Signed)
Extubation Procedure Note  Patient Details:   Name: [name] Andersen DOB: 13-Dec-1950 MRN: [id number]   Airway Documentation:   Patient was weaning from ventilatory support comfortably. Extubated and placed on 4lpm. CPT done via bed(patient's preference) and quad coughing done for small amount of secretions.  Evaluation  O2 sats: stable throughout Complications: No apparent complications Patient did tolerate procedure well. Bilateral Breath Sounds: Diminished Suctioning: Airway Yes  Clearance Coots 04/10/2013, 11:37 AM

## 2013-04-10 NOTE — Progress Notes (Signed)
ANTIBIOTIC CONSULT NOTE - FOLLOW UP  Pharmacy Consult for cefepime Indication: suspected HCAP  No Known Allergies  Patient Measurements: Height: 5\' 6"  (167.6 cm) Weight: 74 lb 15.3 oz (34 kg) IBW/kg (Calculated) : 59.3   Vital Signs: Temp: 98.5 F (36.9 C) (10/20 1122) Temp src: Oral (10/20 1122) BP: 92/53 mmHg (10/20 0930) Pulse Rate: 76 (10/20 0900) Intake/Output from previous day: 10/19 0701 - 10/20 0700 In: 1985 [I.V.:445; NG/GT:1240; IV Piggyback:300] Out: 2991 [Urine:2990; Stool:1] Intake/Output from this shift: Total I/O In: 135 [I.V.:45; NG/GT:90] Out: 200 [Urine:200]  Labs:  Recent Labs  04/08/13 0355 04/09/13 0448 04/10/13 0442  WBC 4.1 5.3 4.3  HGB 10.7* 10.6* 10.0*  PLT 208 216 216  CREATININE <0.20* <0.20* <0.20*   CrCl cannot be calculated (Patient has no sCr result on file.).   Microbiology: Recent Results (from the past 720 hour(s))  CULTURE, BLOOD (ROUTINE X 2)     Status: None   Collection Time    04/02/13 11:30 PM      Result Value Range Status   Specimen Description BLOOD LEFT ANTECUBITAL   Final   Special Requests BOTTLES DRAWN AEROBIC AND ANAEROBIC 5CC   Final   Culture NO GROWTH 5 DAYS   Final   Report Status 04/08/2013 FINAL   Final  CULTURE, BLOOD (ROUTINE X 2)     Status: None   Collection Time    04/02/13 11:45 PM      Result Value Range Status   Specimen Description BLOOD LEFT BRACHIAL COLLECTED BY DOCTOR OPITZ   Final   Special Requests BOTTLES DRAWN AEROBIC ONLY 5CC   Final   Culture NO GROWTH 5 DAYS   Final   Report Status 04/08/2013 FINAL   Final  URINE CULTURE     Status: None   Collection Time    04/03/13 12:50 AM      Result Value Range Status   Specimen Description URINE, CATHETERIZED   Final   Special Requests NONE   Final   Culture  Setup Time     Final   Value: 04/03/2013 03:10     Performed at [facility] Count     Final   Value: 40,000 COLONIES/ML     Performed at [facility]   Culture     Final   Value: Multiple bacterial morphotypes present, none predominant. Suggest appropriate recollection if clinically indicated.     Performed at [facility]   Report Status 04/04/2013 FINAL   Final  MRSA PCR SCREENING     Status: None   Collection Time    04/03/13  3:18 AM      Result Value Range Status   MRSA by PCR NEGATIVE  NEGATIVE Final   Comment:            The GeneXpert MRSA Assay (FDA     approved for NASAL specimens     only), is one component of a     comprehensive MRSA colonization     surveillance program. It is not     intended to diagnose MRSA     infection nor to guide or     monitor treatment for     MRSA infections.  CULTURE, BLOOD (ROUTINE X 2)     Status: None   Collection Time    04/03/13  3:50 AM      Result Value Range Status   Specimen Description BLOOD RIGHT HAND   Final   Special  Requests BOTTLES DRAWN AEROBIC ONLY 10CC   Final   Culture  Setup Time     Final   Value: 04/03/2013 09:13     Performed at [facility]   Culture     Final   Value: NO GROWTH 5 DAYS     Performed at [facility]   Report Status 04/09/2013 FINAL   Final  CULTURE, BLOOD (ROUTINE X 2)     Status: None   Collection Time    04/03/13  4:00 AM      Result Value Range Status   Specimen Description BLOOD LEFT HAND   Final   Special Requests BOTTLES DRAWN AEROBIC ONLY 5CC   Final   Culture  Setup Time     Final   Value: 04/03/2013 09:13     Performed at [facility]   Culture     Final   Value: NO GROWTH 5 DAYS     Performed at [facility]   Report Status 04/09/2013 FINAL   Final  CULTURE, RESPIRATORY (NON-EXPECTORATED)     Status: None   Collection Time    04/03/13  6:15 AM      Result Value Range Status   Specimen Description ENDOTRACHEAL ASPIRATE   Final   Special Requests NONE   Final   Gram Stain     Final   Value: MODERATE WBC PRESENT,BOTH PMN AND MONONUCLEAR     NO SQUAMOUS EPITHELIAL CELLS SEEN     NO  ORGANISMS SEEN     Performed at [facility]   Culture     Final   Value: Non-Pathogenic Oropharyngeal-type Flora Isolated.     Performed at [facility]   Report Status 04/05/2013 FINAL   Final    Anti-infectives   Start     Dose/Rate Route Frequency Ordered Stop   04/04/13 0600  vancomycin (VANCOCIN) IVPB 750 mg/150 ml premix  Status:  Discontinued     750 mg 150 mL/hr over 60 Minutes Intravenous Every 12 hours 04/04/13 0532 04/05/13 0925   04/04/13 0400  ceFEPIme (MAXIPIME) 1 g in dextrose 5 % 50 mL IVPB     1 g 100 mL/hr over 30 Minutes Intravenous Every 24 hours 04/03/13 1445     04/04/13 0000  ceFEPIme (MAXIPIME) 2 g in dextrose 5 % 50 mL IVPB  Status:  Discontinued     2 g 100 mL/hr over 30 Minutes Intravenous Every 24 hours 04/03/13 0334 04/03/13 1445   04/03/13 0400  vancomycin (VANCOCIN) 500 mg in sodium chloride 0.9 % 100 mL IVPB  Status:  Discontinued     500 mg 100 mL/hr over 60 Minutes Intravenous Every 24 hours 04/03/13 0334 04/03/13 2222   04/03/13 0315  ceFEPIme (MAXIPIME) 2 g in dextrose 5 % 50 mL IVPB     2 g 100 mL/hr over 30 Minutes Intravenous  Once 04/03/13 0304 04/03/13 0445   04/03/13 0315  vancomycin (VANCOCIN) IVPB 1000 mg/200 mL premix  Status:  Discontinued     1,000 mg 200 mL/hr over 60 Minutes Intravenous  Once 04/03/13 0304 04/03/13 0304   04/03/13 0000  vancomycin (VANCOCIN) IVPB 1000 mg/200 mL premix     1,000 mg 200 mL/hr over 60 Minutes Intravenous  Once 04/02/13 2354 04/03/13 0135   04/03/13 0000  piperacillin-tazobactam (ZOSYN) IVPB 3.375 g     3.375 g 12.5 mL/hr over 240 Minutes Intravenous  Once 04/02/13 2354 04/03/13 0136     Assessment: 62  YOF who is quadriplegic who continues on day # 8 of cefepime for suspected HCAP. Her renal function is difficult to determine d/t very low body mass and quadriplegia. SCr is 0.2 and likely CrCl is <1mL/min.WBC WNL, Tmax 99.4/24hr. Extubated today.   Goal of Therapy:  proper dosing  for renal function  Plan:  1. Continue cefepime 1g IV Q24h 2. Per CCM team, likely planning for 10 days total- follow up LOT and stop date entered 3. Follow up clinical progression, any drastic change in renal function  Link Snuffer, PharmD, BCPS Clinical Pharmacist [phone] 04/10/2013 1:52 PM
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
## 2013-04-10 NOTE — Progress Notes (Signed)
NUTRITION FOLLOW UP  DOCUMENTATION CODES  Per approved criteria   -Severe malnutrition in the context of chronic illness    Intervention:   Initiate TF via NGT with Jevity 1.2 at 20 ml/h, increase by 10 ml every 4 hours to goal rate of 50 ml/h to provide 1440 kcals, 67 gm protein, 972 ml free water daily.  Nutrition Dx:   Inadequate oral intake related to inability to eat as evidenced by NPO status. Ongoing.  Goal:   Intake to meet >90% of estimated nutrition needs. Unmet.  Monitor:   Respiratory status, TF tolerance, diet advancement and PO intake, labs, weight trend.  Assessment:   Patient with severe depletion of muscle and fat mass due to quadriplegia. Patient with increased nutrition needs to support healing of stage III sacral pressure ulcer.   Patient was extubated this morning. NGT was replaced and plans to start TF via NGT due to poor respiratory status causing inability to eat.  While intubated, patient was receiving TF via OGT with Vital AF 1.2 at 45 ml/h.   Height: Ht Readings from Last 1 Encounters:  04/03/13 5\' 6"  (1.676 m)    Weight Status:   Wt Readings from Last 1 Encounters:  04/10/13 74 lb 15.3 oz (34 kg)  04/08/13  83 lb 1.8 oz (37.7 kg)  04/06/13  89 lb 11.6 oz (40.7 kg)  04/03/13  81 lb 2.1 oz (36.8 kg)   Body mass index is 12.1 kg/(m^2).  Re-estimated needs:  Kcal: 1400 Protein: 65-75 gm Fluid: 1.4 L  Skin: stage III pressure ulcer to sacrum  Diet Order: NPO   Intake/Output Summary (Last 24 hours) at 04/10/13 1520 Last data filed at 04/10/13 0900  Gross per 24 hour  Intake 1657.5 ml  Output   1165 ml  Net  492.5 ml    Last BM: 10/19   Labs:   Recent Labs Lab 04/03/13 1538 04/04/13 0440 04/05/13 0408  04/08/13 0355 04/09/13 0448 04/10/13 0442  NA 138 142 141  < > 142 136 137  K 4.2 4.1 3.1*  < > 3.1* 2.9* 3.5  CL 97 105 98  < > 99 94* 97  CO2 31 32 39*  < > 31 30 33*  BUN 4* <3* 3*  < > 6 5* 7  CREATININE <0.20* <0.20*  <0.20*  < > <0.20* <0.20* <0.20*  CALCIUM 8.2* 7.7* 8.0*  < > 7.8* 8.1* 8.4  MG  --  1.4* 1.8  --   --   --   --   PHOS  --  2.5 2.5  --   --   --   --   GLUCOSE 72 111* 87  < > 65* 126* 132*  < > = values in this interval not displayed.  CBG (last 3)   Recent Labs  04/10/13 0005 04/10/13 0818 04/10/13 1114  GLUCAP 138* 126* 126*    Scheduled Meds: . albuterol  2.5 mg Nebulization Q6H  . antiseptic oral rinse  15 mL Mouth Rinse QID  . baclofen  40 mg Per Tube TID  . ceFEPime (MAXIPIME) IV  1 g Intravenous Q24H  . chlorhexidine  15 mL Mouth Rinse BID  . enoxaparin (LOVENOX) injection  30 mg Subcutaneous Q24H  . feeding supplement (VITAL AF 1.2 CAL)  1,000 mL Per Tube Q24H  . gabapentin  600 mg Per Tube Custom  . pantoprazole (PROTONIX) IV  40 mg Intravenous Q24H    Continuous Infusions:  None  Joaquin Courts,  RD, LDN, CNSC Pager [phone] After Hours Pager [phone]

## 2013-04-10 NOTE — Progress Notes (Signed)
PT/OT Cancellation Note  Patient Details Name: HADAS JESSOP MRN: [id number] DOB: 1951/02/26   Cancelled Treatment:    Reason Eval/Treat Not Completed: Other (comment).  Pt just extubated this morning and awaiting family to bring pt's power chair in to hospital to allow for OOB.  Will f/u tomorrow.     Sunny Schlein, [place] [phone] 04/10/2013, 3:13 PM

## 2013-04-10 NOTE — Progress Notes (Signed)
Patient extubated to 4 liters, coherent, able to follow commands. Weak cough.

## 2013-04-10 NOTE — Progress Notes (Signed)
225cc fentanyl wasted in sink, withnessed with [employer]

## 2013-04-10 NOTE — Progress Notes (Signed)
Patient NPO, high risk for re-intubation and aspiration. Currently has a nasal trumpet in right nare, holding medications till appropriate time to administer and appropriate route

## 2013-04-10 NOTE — Progress Notes (Signed)
UR Completed.  Trisa Cranor Navil [phone] 04/10/2013  

## 2013-04-11 LAB — GLUCOSE, CAPILLARY
Glucose-Capillary: 110 mg/dL — ABNORMAL HIGH (ref 70–99)
Glucose-Capillary: 118 mg/dL — ABNORMAL HIGH (ref 70–99)
Glucose-Capillary: 127 mg/dL — ABNORMAL HIGH (ref 70–99)
Glucose-Capillary: 133 mg/dL — ABNORMAL HIGH (ref 70–99)

## 2013-04-11 MED ORDER — ALPRAZOLAM 0.25 MG PO TABS
0.5000 mg | ORAL_TABLET | ORAL | Status: DC | PRN
  Administered 2013-04-11 – 2013-04-19 (×18): 0.5 mg
  Filled 2013-04-11 (×3): qty 2
  Filled 2013-04-11: qty 1
  Filled 2013-04-11: qty 2
  Filled 2013-04-11: qty 1
  Filled 2013-04-11 (×2): qty 2
  Filled 2013-04-11 (×3): qty 1
  Filled 2013-04-11 (×3): qty 2
  Filled 2013-04-11 (×4): qty 1
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
## 2013-04-11 NOTE — Progress Notes (Signed)
PT Cancellation Note  Patient Details Name: [name] Andersen MRN: [id number] DOB: 19-Jun-1951   Cancelled Treatment:    Reason Treat Not Completed: Patient not medically ready (incr BP, respiratory status)   [name] 04/11/2013, 11:38 AM Pager [phone]

## 2013-04-11 NOTE — Consult Note (Addendum)
Wound care follow-up: Pt remains with Prevalon boots to reduce pressure and Sport low air loss bed. Family member at bedside to assess sacral pressure ulcer.  Discussed deep tissue injuries and possible evolution to full thickness tissue loss.  Family member states this is an ongoing problem when pt is at home.  Area decreasing in size; .6X.6cm dark purple DTI  surrounded by 3 cm erythremia. Previous scabbed area near rectum has evolved into .5X.5cm dark purple deep tissue injury.  Previous heel DTI has resolved. Pt emaciated with multiple systemic factors which can impair healing.  Continue present plan of care with foam dressing in place to protect site.  Cammie Mcgee MSN, RN, CWOCN, [place], CNS [phone]

## 2013-04-11 NOTE — Progress Notes (Signed)
OT Cancellation Note  Patient Details Name: [name] Andersen MRN: [id number] DOB: 09-08-1950   Cancelled Treatment:    Reason Eval/Treat Not Completed: Medical issues which prohibited therapy.  Increased BP and resp. Status)  Jeani Hawking [phone] 04/11/2013, 1:33 PM
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
## 2013-04-12 ENCOUNTER — Inpatient Hospital Stay (HOSPITAL_COMMUNITY): Payer: BC Managed Care – PPO

## 2013-04-12 LAB — CBC
MCH: 28.5 pg (ref 26.0–34.0)
MCHC: 31.4 g/dL (ref 30.0–36.0)
Platelets: 273 10*3/uL (ref 150–400)
RDW: 13.6 % (ref 11.5–15.5)
WBC: 7.2 10*3/uL (ref 4.0–10.5)

## 2013-04-12 LAB — BASIC METABOLIC PANEL
BUN: 13 mg/dL (ref 6–23)
Calcium: 8.5 mg/dL (ref 8.4–10.5)
Chloride: 105 mEq/L (ref 96–112)
Creatinine, Ser: <0.2 mg/dL — ABNORMAL LOW (ref 0.50–1.10)
Glucose, Bld: 146 mg/dL — ABNORMAL HIGH (ref 70–99)
Sodium: 147 mEq/L — ABNORMAL HIGH (ref 135–145)

## 2013-04-12 LAB — GLUCOSE, CAPILLARY
Glucose-Capillary: 135 mg/dL — ABNORMAL HIGH (ref 70–99)
Glucose-Capillary: 147 mg/dL — ABNORMAL HIGH (ref 70–99)

## 2013-04-12 LAB — TSH: TSH: 0.493 u[IU]/mL (ref 0.350–4.500)

## 2013-04-12 MED ORDER — WHITE PETROLATUM GEL
Status: AC
  Administered 2013-04-12: 17:00:00
  Filled 2013-04-12: qty 5

## 2013-04-12 MED ORDER — POTASSIUM CHLORIDE 20 MEQ/15ML (10%) PO LIQD
20.0000 meq | ORAL | Status: AC
  Administered 2013-04-12 (×2): 20 meq
  Filled 2013-04-12 (×2): qty 15

## 2013-04-12 MED ORDER — LEVOFLOXACIN 500 MG PO TABS
500.0000 mg | ORAL_TABLET | Freq: Every day | ORAL | Status: DC
  Administered 2013-04-12 – 2013-04-14 (×3): 500 mg
  Filled 2013-04-12 (×3): qty 1

## 2013-04-12 MED ORDER — FREE WATER
200.0000 mL | Freq: Three times a day (TID) | Status: DC
  Administered 2013-04-12 – 2013-04-19 (×19): 200 mL

## 2013-04-12 NOTE — Progress Notes (Signed)
Physical Therapy  Spoke with RN and patient re: attempting OOB to chair today. Pt agreeable. Family had stated last week (10/17) that she would do best getting up to her electric wheelchair with her cushion and that they could bring in her chair when she is ready. Called patient's home number and left a message for her husband re: bringing in her chair. Left my pager number for him to return call.   04/12/2013 Veda Canning, PT Pager: [phone]
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
## 2013-04-12 NOTE — Progress Notes (Signed)
OT Cancellation Note  Patient Details Name: JODEL MAYHALL MRN: [id number] DOB: [birth year]   Cancelled Treatment:    Reason Eval/Treat Not Completed: Medical issues which prohibited therapy - Pt with decreased 02 sats.  Will reattempt tomorrow.  Jeani Hawking M [phone] 04/12/2013, 3:03 PM
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
## 2013-04-12 NOTE — Progress Notes (Signed)
[facility] ADULT ICU REPLACEMENT PROTOCOL FOR AM LAB REPLACEMENT ONLY  The patient does apply for the [facility] Adult ICU Electrolyte Replacment Protocol based on the criteria listed below:   1. Is GFR >/= 40 ml/min? yes  Patient's GFR today is >90 2. Is urine output >/= 0.5 ml/kg/hr for the last 6 hours? yes Patient's UOP is 1.4 ml/kg/hr 3. Is BUN < 60 mg/dL? yes  Patient's BUN today is 13 4. Abnormal electrolyte(s): K 3.4 5. Ordered repletion with: per protocol 6. If a panic level lab has been reported, has the CCM MD in charge been notified? yes.   Physician:  Sharol Roussel 04/12/2013 5:21 AM

## 2013-04-12 NOTE — Progress Notes (Signed)
PT Cancellation Note  Patient Details Name: [name] Andersen MRN: [id number] DOB: [birth year]   Cancelled Treatment:    Reason Treat Not Completed: Spoke with pt's husband and he is agreeable to bringing her chair to hospital for her to begin getting OOB. He plans to bring it today and will bring charger as well.    Briyanna Billingham 04/12/2013, 12:25 PM Pager [phone]

## 2013-04-13 LAB — BASIC METABOLIC PANEL
BUN: 14 mg/dL (ref 6–23)
CO2: 39 mEq/L — ABNORMAL HIGH (ref 19–32)
Calcium: 8.8 mg/dL (ref 8.4–10.5)
Creatinine, Ser: <0.2 mg/dL — ABNORMAL LOW (ref 0.50–1.10)
Glucose, Bld: 151 mg/dL — ABNORMAL HIGH (ref 70–99)
Potassium: 4 mEq/L (ref 3.5–5.1)
Sodium: 142 mEq/L (ref 135–145)

## 2013-04-13 LAB — CBC
Hemoglobin: 10.2 g/dL — ABNORMAL LOW (ref 12.0–15.0)
MCH: 29.7 pg (ref 26.0–34.0)
MCHC: 32.3 g/dL (ref 30.0–36.0)
Platelets: 266 10*3/uL (ref 150–400)

## 2013-04-13 LAB — GLUCOSE, CAPILLARY
Glucose-Capillary: 114 mg/dL — ABNORMAL HIGH (ref 70–99)
Glucose-Capillary: 126 mg/dL — ABNORMAL HIGH (ref 70–99)

## 2013-04-13 MED ORDER — CETIRIZINE HCL 5 MG/5ML PO SYRP
5.0000 mg | ORAL_SOLUTION | Freq: Every day | ORAL | Status: DC
  Administered 2013-04-13 – 2013-04-19 (×7): 5 mg
  Filled 2013-04-13 (×8): qty 5

## 2013-04-13 MED ORDER — DIPHENHYDRAMINE HCL 12.5 MG/5ML PO ELIX
12.5000 mg | ORAL_SOLUTION | Freq: Four times a day (QID) | ORAL | Status: DC | PRN
  Administered 2013-04-13 – 2013-04-14 (×2): 12.5 mg
  Filled 2013-04-13 (×2): qty 5

## 2013-04-13 MED ORDER — ENOXAPARIN SODIUM 30 MG/0.3ML ~~LOC~~ SOLN
20.0000 mg | SUBCUTANEOUS | Status: DC
  Administered 2013-04-13 – 2013-04-28 (×16): 20 mg via SUBCUTANEOUS
  Filled 2013-04-13 (×23): qty 0.2

## 2013-04-13 MED ORDER — DIPHENHYDRAMINE HCL 12.5 MG/5ML PO ELIX
25.0000 mg | ORAL_SOLUTION | Freq: Four times a day (QID) | ORAL | Status: DC | PRN
  Administered 2013-04-13: 25 mg
  Filled 2013-04-13 (×2): qty 10

## 2013-04-13 NOTE — Progress Notes (Signed)
Family and pt. Requested not to awake pt. for CPT. RN is aware.
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
## 2013-04-13 NOTE — Progress Notes (Signed)
Chaplain offered emotional and spiritual support, empathic listening, and presence to pt's sister, Darl Pikes. Pt was sleeping.   Darl Pikes explained that pt is "finally improving," but "has not been sleeping well." She said that the family "thought this was it" but "are beginning to feel hopeful again." Darl Pikes explained that pt has been saying for a long time that she is "tired," "this body is tired."   Pt's sister was appreciative and grateful for chaplain's visit and said she will tell pt chaplain visited.   Guy Sandifer [place], [phone]

## 2013-04-13 NOTE — Progress Notes (Signed)
NUTRITION FOLLOW UP  DOCUMENTATION CODES  Per approved criteria   -Severe malnutrition in the context of chronic illness    Intervention:   Continue TF via NGT with Jevity 1.2 at 50 ml/h providing 1440 kcals, 67 gm protein, 972 ml free water daily.  Nutrition Dx:   Inadequate oral intake related to inability to eat as evidenced by NPO status. Ongoing.  Goal:   Intake to meet >90% of estimated nutrition needs. Met.  Monitor:   Respiratory status, TF tolerance, diet advancement and PO intake, labs, weight trend.  Assessment:   Patient with severe depletion of muscle and fat mass due to quadriplegia. Patient with increased nutrition needs to support healing of stage III sacral pressure ulcer.   Patient was extubated on 10/20. NGT was replaced and TF resumed via NGT due to poor respiratory status causing inability to eat. Patient is a high risk for re-intubation, therefore, plans are to continue NPO status with TF via NGT to meet nutrition needs. Weight continues to trend down.  Patient is tolerating TF well at goal rate without residuals to meet 100% of estimated nutrition needs.   Height: Ht Readings from Last 1 Encounters:  04/03/13 5\' 6"  (1.676 m)    Weight Status:   Wt Readings from Last 1 Encounters:  04/12/13 70 lb 15.8 oz (32.2 kg)  04/08/13  83 lb 1.8 oz (37.7 kg)  04/07/13  89 lb 11.6 oz (40.7 kg)  04/06/13  89 lb 11.6 oz (40.7 kg)  04/03/13  81 lb 2.1 oz (36.8 kg)   Body mass index is 11.46 kg/(m^2).  Re-estimated needs:  Kcal: 1400 Protein: 65-75 gm Fluid: 1.4 L  Skin: stage III pressure ulcer to sacrum  Diet Order: NPO  TF Order: Jevity 1.2 at  50 ml/h providing 1440 kcals, 67 gm protein, 972 ml free water daily   Intake/Output Summary (Last 24 hours) at 04/13/13 1528 Last data filed at 04/13/13 1500  Gross per 24 hour  Intake   1710 ml  Output   1475 ml  Net    235 ml    Last BM: 10/19   Labs:   Recent Labs Lab 04/10/13 0442  04/12/13 0350 04/13/13 0752  NA 137 147* 142  K 3.5 3.4* 4.0  CL 97 105 98  CO2 33* 39* 39*  BUN 7 13 14   CREATININE <0.20* <0.20* <0.20*  CALCIUM 8.4 8.5 8.8  GLUCOSE 132* 146* 151*    CBG (last 3)   Recent Labs  04/12/13 1139 04/12/13 2335 04/13/13 0750  GLUCAP 147* 126* 149*    Scheduled Meds: . albuterol  2.5 mg Nebulization Q6H  . antiseptic oral rinse  15 mL Mouth Rinse QID  . baclofen  40 mg Per Tube TID  . cetirizine HCl  5 mg Per Tube Daily  . chlorhexidine  15 mL Mouth Rinse BID  . enoxaparin (LOVENOX) injection  20 mg Subcutaneous Q24H  . free water  200 mL Per Tube Q8H  . gabapentin  600 mg Per Tube Custom  . levofloxacin  500 mg Per Tube Daily    Continuous Infusions: . feeding supplement (JEVITY 1.2 CAL) 50 mL/hr (04/12/13 1145)    Joaquin Courts, RD, LDN, CNSC Pager [phone] After Hours Pager [phone]

## 2013-04-14 ENCOUNTER — Inpatient Hospital Stay (HOSPITAL_COMMUNITY): Payer: BC Managed Care – PPO

## 2013-04-14 LAB — GLUCOSE, CAPILLARY
Glucose-Capillary: 119 mg/dL — ABNORMAL HIGH (ref 70–99)
Glucose-Capillary: 113 mg/dL — ABNORMAL HIGH (ref 70–99)

## 2013-04-14 LAB — BASIC METABOLIC PANEL
Calcium: 8.8 mg/dL (ref 8.4–10.5)
Chloride: 97 mEq/L (ref 96–112)
Potassium: 3.9 mEq/L (ref 3.5–5.1)
Sodium: 141 mEq/L (ref 135–145)

## 2013-04-14 LAB — CBC
HCT: 31.5 % — ABNORMAL LOW (ref 36.0–46.0)
MCH: 28.8 pg (ref 26.0–34.0)
MCHC: 31.7 g/dL (ref 30.0–36.0)
Platelets: 273 10*3/uL (ref 150–400)
RDW: 13.7 % (ref 11.5–15.5)
WBC: 8.3 10*3/uL (ref 4.0–10.5)

## 2013-04-14 MED ORDER — LEVOFLOXACIN 500 MG PO TABS
500.0000 mg | ORAL_TABLET | ORAL | Status: DC
  Administered 2013-04-16 – 2013-04-20 (×3): 500 mg
  Filled 2013-04-14 (×3): qty 1

## 2013-04-14 MED ORDER — ALBUTEROL SULFATE (5 MG/ML) 0.5% IN NEBU
1.2500 mg | INHALATION_SOLUTION | Freq: Four times a day (QID) | RESPIRATORY_TRACT | Status: DC
  Administered 2013-04-15: 2.5 mg via RESPIRATORY_TRACT
  Administered 2013-04-15: 17:00:00 via RESPIRATORY_TRACT
  Administered 2013-04-15 (×2): 2.5 mg via RESPIRATORY_TRACT
  Administered 2013-04-16: 1.25 mg via RESPIRATORY_TRACT
  Administered 2013-04-16 (×2): 2.5 mg via RESPIRATORY_TRACT
  Filled 2013-04-14 (×10): qty 0.5

## 2013-04-14 MED ORDER — METOPROLOL TARTRATE 1 MG/ML IV SOLN
2.5000 mg | INTRAVENOUS | Status: DC | PRN
  Filled 2013-04-14: qty 5

## 2013-04-14 MED ORDER — WHITE PETROLATUM GEL
Status: AC
  Administered 2013-04-14: 0.2
  Filled 2013-04-14: qty 5

## 2013-04-14 MED ORDER — ALBUTEROL SULFATE (5 MG/ML) 0.5% IN NEBU
2.5000 mg | INHALATION_SOLUTION | RESPIRATORY_TRACT | Status: DC | PRN
  Administered 2013-04-17: 2.5 mg via RESPIRATORY_TRACT
  Filled 2013-04-14: qty 0.5

## 2013-04-14 MED ORDER — ALBUTEROL SULFATE (5 MG/ML) 0.5% IN NEBU
1.2500 mg | INHALATION_SOLUTION | Freq: Four times a day (QID) | RESPIRATORY_TRACT | Status: DC
  Administered 2013-04-14: 1.25 mg via RESPIRATORY_TRACT
  Filled 2013-04-14 (×2): qty 0.5

## 2013-04-14 NOTE — Progress Notes (Signed)
PT did not feel up for CPT at this time, RT will continue to monitor.
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
## 2013-04-14 NOTE — Progress Notes (Signed)
Pt has refused to do any more chest PT as she thinks it is conrtibuting to her pressure ulcer. RN and MD aware.

## 2013-04-14 NOTE — Progress Notes (Signed)
PT Cancellation Note  Patient Details Name: [name] Andersen MRN: [id number] DOB: 1951/03/21   Cancelled Treatment:    Reason Treat Not Completed: 1) Patient not medically ready--attempted at 10:06 am and pt diaphoretic with elevated BP s/p albuterol treatment and asked to defer OOB until this pm; 2) Patient unavailable--pt with lunch tray just arrived and this is her first attempt to eat. She requested PT defer OOB at this time so she could try to eat. (Agree she would be too fatigued to get up to chair and then try to eat as she has limited reserve at this time). Pt/family aware PT may not be able to return again today.   Gene Colee 04/14/2013, 1:22 PM Pager [phone]
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
## 2013-04-14 NOTE — Progress Notes (Signed)
Patient resting comfortably pending transfer to SDU.  Report called to Denny Peon, Charity fundraiser.

## 2013-04-15 LAB — GLUCOSE, CAPILLARY
Glucose-Capillary: 109 mg/dL — ABNORMAL HIGH (ref 70–99)
Glucose-Capillary: 136 mg/dL — ABNORMAL HIGH (ref 70–99)
Glucose-Capillary: 140 mg/dL — ABNORMAL HIGH (ref 70–99)

## 2013-04-15 MED ORDER — ACETYLCYSTEINE 20 % IN SOLN
3.0000 mL | Freq: Two times a day (BID) | RESPIRATORY_TRACT | Status: AC
  Administered 2013-04-16 – 2013-04-18 (×4): 3 mL via RESPIRATORY_TRACT
  Filled 2013-04-15 (×9): qty 4

## 2013-04-15 NOTE — Progress Notes (Signed)
No COPT through the night. Patient likes to rest. Will begin again in AM
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
## 2013-04-16 LAB — GLUCOSE, CAPILLARY
Glucose-Capillary: 131 mg/dL — ABNORMAL HIGH (ref 70–99)
Glucose-Capillary: 123 mg/dL — ABNORMAL HIGH (ref 70–99)

## 2013-04-16 MED ORDER — GUAIFENESIN 100 MG/5ML PO SOLN
10.0000 mL | ORAL | Status: DC
  Administered 2013-04-16 – 2013-04-17 (×7): 200 mg
  Filled 2013-04-16 (×13): qty 10

## 2013-04-16 NOTE — Progress Notes (Signed)
Pt is very anxious and says she wants to try to not do chest vest at this time.  Rt and RN will continue to monitor.  Pt wanted to omit tx as well, but Rt asked her to choose cpt or tx.

## 2013-04-16 NOTE — Progress Notes (Signed)
NTS patient times tow attempts with great results. Suctioned for Copious amounts of thick tenacious White secretions. Tolerated well and Sat's remained in mid to upper 90's, Hr came down from 128 to low 100's. RN assisted and patient doing better before I left the room.
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
## 2013-04-16 NOTE — Progress Notes (Signed)
Called to patients room for desaturation issues and increased work of breathing. Patient on 5lpm with Sp02=83%. Placed patient on 50% venturi mask with SP02 increasing to 94%. Breath sounds decreased airflow in left lower lobe. Attempted quad cough with patient with no success. Nasal tracheal sx patient for large amount of thick creamy colored sputum. Increased airflow after sx in the left lower lobe. Returned patient to 4lpm nasal cannula with Sp02=95%,patient breathing easier at this time

## 2013-04-17 ENCOUNTER — Inpatient Hospital Stay (HOSPITAL_COMMUNITY): Payer: BC Managed Care – PPO

## 2013-04-17 LAB — BASIC METABOLIC PANEL
BUN: 21 mg/dL (ref 6–23)
Calcium: 9 mg/dL (ref 8.4–10.5)
Chloride: 98 mEq/L (ref 96–112)
Creatinine, Ser: <0.2 mg/dL — ABNORMAL LOW (ref 0.50–1.10)
Potassium: 4 mEq/L (ref 3.5–5.1)
Sodium: 141 mEq/L (ref 135–145)

## 2013-04-17 LAB — CBC
HCT: 33.7 % — ABNORMAL LOW (ref 36.0–46.0)
Hemoglobin: 10.9 g/dL — ABNORMAL LOW (ref 12.0–15.0)
MCH: 29 pg (ref 26.0–34.0)
MCHC: 32.3 g/dL (ref 30.0–36.0)
WBC: 8 10*3/uL (ref 4.0–10.5)

## 2013-04-17 LAB — GLUCOSE, CAPILLARY
Glucose-Capillary: 111 mg/dL — ABNORMAL HIGH (ref 70–99)
Glucose-Capillary: 128 mg/dL — ABNORMAL HIGH (ref 70–99)
Glucose-Capillary: 129 mg/dL — ABNORMAL HIGH (ref 70–99)

## 2013-04-17 MED ORDER — COLLAGENASE 250 UNIT/GM EX OINT
TOPICAL_OINTMENT | Freq: Every day | CUTANEOUS | Status: DC
  Administered 2013-04-17 – 2013-04-19 (×3): via TOPICAL
  Administered 2013-04-20 – 2013-04-21 (×2): 1 via TOPICAL
  Administered 2013-04-22 – 2013-04-27 (×5): via TOPICAL
  Filled 2013-04-17: qty 30

## 2013-04-17 MED ORDER — ALBUTEROL SULFATE (5 MG/ML) 0.5% IN NEBU
2.5000 mg | INHALATION_SOLUTION | RESPIRATORY_TRACT | Status: DC
  Administered 2013-04-17 (×2): 2.5 mg via RESPIRATORY_TRACT
  Filled 2013-04-17: qty 0.5

## 2013-04-17 MED ORDER — DIPHENHYDRAMINE HCL 50 MG/ML IJ SOLN
25.0000 mg | Freq: Once | INTRAMUSCULAR | Status: AC
Start: 2013-04-17 — End: 2013-04-17
  Administered 2013-04-17: 25 mg via INTRAVENOUS
  Filled 2013-04-17: qty 1

## 2013-04-17 NOTE — Progress Notes (Signed)
During repositioning, pt stated that she felt some discomfort in her bladder and felt that she was retaining urine in her bladder.  Upon gentle palpation, some distention was noted.  Adjusted foley tubing to ensure that there were no kinks. (This is not an isolated event of urinary retention even with Foley for this pt as she states that her family and the staff have had to aid in the emptying of her bladder frequently.)  Placed pt in reverse trendelenburg and with inital gentle palpation over bladder the urine flowed freely.  Drainage bag was emptied immediately prior to this intervention.  Output for this event was of clear, yellow urine with sediment. There is and has been what appears to be chronic discoloration on her lower abdomen, which she attributes to the ongoing and frequent assistance that she requires for this issue.  Pt stated that she her home regimen is that she is in and out cathed during the day and uses a foley at night.  Will continue to monitor and provide assistance/interventions as pt's needs dictate and will pass information along to next shift for followup. MD notified of above.

## 2013-04-17 NOTE — Progress Notes (Signed)
Pt c/o sweating saying, "It's my autonomic dysreflexia, start checking things, check my bladder." Did not note any distinct firmness to bladder, readjusted the tubing and repositioned pt and of cloudy, yellow urine was collected.  Bladder scan was done and the resulting residual was 34ml.  Pt was then repositioned several times until she was comfortable.  Pt appears to be resting comfortably at this time.  Will continue to monitor.

## 2013-04-17 NOTE — Progress Notes (Signed)
Pt c/o severe itching generalized all over her body, especially her jawline, neck and torso.  MD was notified and prn Benadryl 25mg  iv was ordered and given slowly over several minutes.  Will continue to monitor.
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
## 2013-04-17 NOTE — Progress Notes (Signed)
Throughout the night shift pt had been well maintained on O2 via [place] at 4L until around 0645, after turning and repositioning, pt's WOB increased and sats fell to 84%.  Called RT to assess.  In the meantime, placed her on Venti mask at 50% and while sats did increase to 90-91% pt still appeared to be in marked distress.  RT then placed pt on NRB and pt responded well to that, eventually sats increased to 100%, color improved and WOB decreased to nearly baseline.  The night RT and day RT remain at bedside, monitoring pt's condition.  Will continue to monitor.

## 2013-04-17 NOTE — Progress Notes (Signed)
Pt refused CPT tonight. Only wanted Tx.
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
## 2013-04-17 NOTE — Consult Note (Deleted)
Wound care follow-up: Pt remains with Prevalon boots to reduce pressure and Sport low air loss bed. Family member at bedside to assess sacral pressure ulcer.  Discussed deep tissue injuries and possible evolution to full thickness tissue loss.  Family member states this is an ongoing problem when pt is at home.  Area decreasing in size; .6X.6cm dark purple DTI  surrounded by 3 cm erythremia. Previous scabbed area near rectum has evolved into .5X.5cm dark purple deep tissue injury.  Previous heel DTI has resolved. Pt emaciated with multiple systemic factors which can impair healing.  Continue present plan of care with foam dressing in place to protect site.  Natividad Halls MSN, RN, CWOCN, CWCN-AP, CNS [phone] 

## 2013-04-17 NOTE — Consult Note (Deleted)
This note is from 10/23 and was retrived from wrong location WOC wound consult note Reason for Consult: Consult requested for buttocks wounds.  Wound type: Sacrum with 5X5 cm darker colored skin consistent with deep tissue injury.  Beginning to peel and evolve into patchy areas of partial thickness skin loss. Inner left buttocks near gluteal cleft with stage 2 wounds noted on admission.  10X1X.1cm, 80% red and moist stage 2, 20% darker black colored deep tissue area which has not evolved at this time. Another site to inner gluteal fold .5X1X.1cm 100% red moist stage 2 wound. Pressure Ulcer POA: Yes Dressing procedure/placement/frequency: Pt is on a Sport low air loss bed to reduce pressure.  Foam dressings were appropriately applied for topical treatment, but pt is incontinent of stool frequently.  Cleaned 3 times in 5 minutes.  This will become trapped against skin under any type of dressing.  Barrier cream to protect and repel incontinence.  Expect wounds to evolve into full thickness skin loss; this is frequently the case with deep tissue injuries and pt has multiple systemic factors which can impair healing. Educational handout given regarding pressure ulcer information which discusses pressure ulcer etiology, topical treatment, and preventive measures.  No family members present and pt does not appear to understand. Please re-consult if further assistance is needed.  Thank-you,  Cammie Mcgee MSN, RN, CWOCN, [place], CNS [phone]
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
## 2013-04-18 LAB — CBC
HCT: 30.4 % — ABNORMAL LOW (ref 36.0–46.0)
Hemoglobin: 9.9 g/dL — ABNORMAL LOW (ref 12.0–15.0)
MCHC: 32.6 g/dL (ref 30.0–36.0)
RDW: 13.5 % (ref 11.5–15.5)
WBC: 7.9 10*3/uL (ref 4.0–10.5)

## 2013-04-18 LAB — BASIC METABOLIC PANEL
BUN: 19 mg/dL (ref 6–23)
Chloride: 95 mEq/L — ABNORMAL LOW (ref 96–112)
Glucose, Bld: 136 mg/dL — ABNORMAL HIGH (ref 70–99)
Potassium: 3.9 mEq/L (ref 3.5–5.1)
Sodium: 137 mEq/L (ref 135–145)

## 2013-04-18 LAB — GLUCOSE, CAPILLARY: Glucose-Capillary: 96 mg/dL (ref 70–99)

## 2013-04-18 MED ORDER — DOCUSATE SODIUM 50 MG/5ML PO LIQD
100.0000 mg | Freq: Every day | ORAL | Status: DC
  Administered 2013-04-18 – 2013-04-19 (×2): 100 mg
  Filled 2013-04-18 (×4): qty 10

## 2013-04-18 MED ORDER — BISACODYL 10 MG RE SUPP
10.0000 mg | Freq: Every day | RECTAL | Status: DC | PRN
  Administered 2013-04-18 – 2013-04-22 (×2): 10 mg via RECTAL
  Filled 2013-04-18 (×2): qty 1

## 2013-04-18 MED ORDER — ALBUTEROL SULFATE (5 MG/ML) 0.5% IN NEBU
2.5000 mg | INHALATION_SOLUTION | Freq: Four times a day (QID) | RESPIRATORY_TRACT | Status: DC
  Administered 2013-04-18 – 2013-04-20 (×9): 2.5 mg via RESPIRATORY_TRACT
  Filled 2013-04-18 (×11): qty 0.5
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
## 2013-04-18 NOTE — Progress Notes (Addendum)
Pt tolerated OOB in her wheel chair for 2 hours in afternoon, while her daughter was visiting. Attention given to stage 2 P.U. in sacral area. Pt in process of changing her code status and wishes it formalized after she speaks with her husband tomorrow.

## 2013-04-18 NOTE — Progress Notes (Signed)
eLink Physician-Brief Progress Note Patient Name: [name] Andersen DOB: September 10, 1950 MRN: [id number]  Date of Service  04/18/2013   HPI/Events of Note     eICU Interventions  Dulcolax, colace for constipation   Intervention Category Minor Interventions: Routine modifications to care plan (e.g. PRN medications for pain, fever)  [name] Andersen S. 04/18/2013, 8:10 PM

## 2013-04-18 NOTE — Progress Notes (Signed)
Occupational Therapy Discharge Patient Details Name: ANGELIQUE CHEVALIER MRN: [id number] DOB: Aug 15, 1950 Today's Date: 04/18/2013 Time:  -     Patient discharged from OT services secondary to  Pt is actively requesting to discharge OT/PT.  Pt states she needs to focus on her sacral wound healing.  Pt verbalizes understanding of risks associated with prolonged bedrest/immobility and states "my lungs are clear today".   Pt voices that she is upset that she was told she would only be up in w/c an hour yesterday, but that it was 2 hours before someone assisted her to bed.  Pt indicates that she did not call nsg at an hour's time.   Instructed pt that we would recommend HH therapies, and if she changes her mind to have her MD re-consult [place].  She verbalized understanding of all.  .  Please see latest therapy progress note for current level of functioning and progress toward goals.    Progress and discharge plan discussed with patient and/or caregiver: Patient/Caregiver agrees with plan  GO     Boykin Reaper [phone] 04/18/2013, 12:43 PM

## 2013-04-19 ENCOUNTER — Inpatient Hospital Stay (HOSPITAL_COMMUNITY): Payer: BC Managed Care – PPO

## 2013-04-19 LAB — GLUCOSE, CAPILLARY

## 2013-04-19 MED ORDER — DIPHENHYDRAMINE HCL 50 MG/ML IJ SOLN
INTRAMUSCULAR | Status: AC
  Filled 2013-04-19: qty 1

## 2013-04-19 MED ORDER — LORAZEPAM 2 MG/ML IJ SOLN
0.5000 mg | INTRAMUSCULAR | Status: DC | PRN
  Administered 2013-04-20 (×2): 0.5 mg via INTRAVENOUS
  Filled 2013-04-19 (×2): qty 1
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
## 2013-04-19 NOTE — Progress Notes (Signed)
Pt refused Chest Vest at this time. Pt is clear and diminished with sat 100% on NRB. RT will continue to monitor.
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
## 2013-04-19 NOTE — Progress Notes (Signed)
Attempted NTS. Unsuccessful at this time. Pt does not like this procedure but will tolerate if needed.

## 2013-04-19 NOTE — Progress Notes (Signed)
Pt complaining of SOB, and weakness requesting RT. Called RT did not get a answer . WiIl report off to day nurse. Pt concerns. Will continue to monitor pt.

## 2013-04-19 NOTE — Progress Notes (Signed)
Pt tolerated 2 hours OOB in wheelchair. On return to bed, pt attempted to mobilize sputum with some success. R.T. Called to assist with "Quod coughing" . At 15:00 tube feeding was turned off with concern pt may have aspirated some tube feed. Panda tube removed and pt placed on non rebreather to maintain Sats >92. Hus[place] d and daughters visited with pt sharing with them her decision to be DNR/DNI. Pt exhausted and finally agreed to naso--tracheal suctioning with little sputum. Replacement of feeding tube delayed at present.

## 2013-04-19 NOTE — Progress Notes (Signed)
Called to bedside by RN for episode of desaturation.  Patient placed on venturi mask per RT.  Poor cough.  Noted feeding tube had been displaced (pulled out some).     Plan: -encourage cough / deep breathing -vibra vest -oxygen to support sats 90-95% -NTS PRN -reinsert feeding tube with KUB post for placement    [name] Brim, NP-C [facility] Pgr: [phone] or [phone]

## 2013-04-20 ENCOUNTER — Inpatient Hospital Stay (HOSPITAL_COMMUNITY): Payer: BC Managed Care – PPO

## 2013-04-20 LAB — BASIC METABOLIC PANEL
BUN: 18 mg/dL (ref 6–23)
CO2: 38 mEq/L — ABNORMAL HIGH (ref 19–32)
Calcium: 8.7 mg/dL (ref 8.4–10.5)
GFR calc Af Amer: >90 mL/min (ref >90)
GFR calc non Af Amer: >90 mL/min (ref >90)
Sodium: 142 mEq/L (ref 135–145)

## 2013-04-20 LAB — MAGNESIUM: Magnesium: 2.1 mg/dL (ref 1.5–2.5)

## 2013-04-20 LAB — CBC
Platelets: 314 10*3/uL (ref 150–400)
RBC: 3.67 MIL/uL — ABNORMAL LOW (ref 3.87–5.11)
WBC: 4.6 10*3/uL (ref 4.0–10.5)

## 2013-04-20 LAB — PHOSPHORUS: Phosphorus: 3.7 mg/dL (ref 2.3–4.6)

## 2013-04-20 MED ORDER — DOCUSATE SODIUM 100 MG PO CAPS
100.0000 mg | ORAL_CAPSULE | Freq: Every day | ORAL | Status: DC
  Administered 2013-04-21 – 2013-04-23 (×2): 100 mg via ORAL
  Filled 2013-04-20 (×10): qty 1

## 2013-04-20 MED ORDER — JEVITY 1.2 CAL PO LIQD
1000.0000 mL | ORAL | Status: DC
  Administered 2013-04-20 – 2013-04-22 (×3): 1000 mL
  Filled 2013-04-20 (×5): qty 1000

## 2013-04-20 MED ORDER — JEVITY 1.2 CAL PO LIQD
200.0000 mL | Freq: Three times a day (TID) | ORAL | Status: DC
  Filled 2013-04-20 (×4): qty 237

## 2013-04-20 MED ORDER — ALPRAZOLAM 0.5 MG PO TABS
0.5000 mg | ORAL_TABLET | Freq: Three times a day (TID) | ORAL | Status: DC | PRN
  Administered 2013-04-21 – 2013-04-28 (×12): 0.5 mg via ORAL
  Administered 2013-04-29: 0.25 mg via ORAL
  Filled 2013-04-20 (×2): qty 2
  Filled 2013-04-20 (×3): qty 1
  Filled 2013-04-20 (×5): qty 2
  Filled 2013-04-20 (×2): qty 1
  Filled 2013-04-20: qty 2
  Filled 2013-04-20: qty 1

## 2013-04-20 NOTE — Progress Notes (Signed)
Pt refusing Chest Vest at this time.
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
## 2013-04-20 NOTE — Progress Notes (Signed)
Pt refusing Chest Vest tonight and does not want to be woken up for treatment.

## 2013-04-20 NOTE — Progress Notes (Signed)
Pt refusing Chest Vest at this time.  
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
## 2013-04-20 NOTE — Progress Notes (Signed)
Pt currently asleep, family does not want pt woke up at this time. Pt still needs bath, and pt wanted dressing changed to sacrum after bath or during bath. Family told NT that they would take care of bath later when she awakens. Also plan for pt feeding tube to be placed after she awakens. Pt will start back on tube feeds tonight, and will continue to take in po diet during the day. See nutritionist notes.

## 2013-04-20 NOTE — Progress Notes (Signed)
Patient took breathing treatment but husband wants to wait till later in day to do chest pt.

## 2013-04-21 LAB — GLUCOSE, CAPILLARY: Glucose-Capillary: 148 mg/dL — ABNORMAL HIGH (ref 70–99)

## 2013-04-21 MED ORDER — ONDANSETRON HCL 4 MG/2ML IJ SOLN
4.0000 mg | INTRAMUSCULAR | Status: DC | PRN
  Administered 2013-04-21 – 2013-04-25 (×3): 4 mg via INTRAVENOUS
  Filled 2013-04-21 (×3): qty 2

## 2013-04-21 MED ORDER — LEVALBUTEROL HCL 0.63 MG/3ML IN NEBU
0.6300 mg | INHALATION_SOLUTION | RESPIRATORY_TRACT | Status: DC | PRN
  Filled 2013-04-21: qty 3

## 2013-04-21 MED ORDER — LEVALBUTEROL HCL 0.63 MG/3ML IN NEBU: 0.6300 mg | INHALATION_SOLUTION | Freq: Four times a day (QID) | RESPIRATORY_TRACT | Status: DC | PRN

## 2013-04-21 NOTE — Progress Notes (Signed)
Pt asleep and does not want to be disturbed for Chest Vest. RT will continue to monitor.

## 2013-04-21 NOTE — Progress Notes (Signed)
PT Cancellation Note  Patient Details Name: [name] Andersen MRN: [id number] DOB: 04/12/1951   Cancelled Treatment:    Reason Eval/Treat Not Completed: Fatigue/lethargy limiting ability to participate.  Did not get much sleep last night.  Resting.  Will f/u Monday.  Thanks.   INGOLD,Marlowe Lawes 04/21/2013, 11:23 AM [name] [phone] (pager)

## 2013-04-21 NOTE — Progress Notes (Signed)
Pt is refusing CPT at this time. Pt also stated that if she was asleep to not wake her up to let her sleep. RT made pt aware that if she changed her mind to please call and we will do CPT with her.

## 2013-04-21 NOTE — Progress Notes (Signed)
Patient and sister still refuse chest vest at this time.

## 2013-04-21 NOTE — Progress Notes (Signed)
Pt refused CPT at this time.
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
## 2013-04-21 NOTE — Progress Notes (Signed)
Patient and family are refusing the chest vest today. Stated she was too weak for it. Rt will continue to check on patient.

## 2013-04-22 LAB — RETICULOCYTES
RBC.: 3.61 MIL/uL — ABNORMAL LOW (ref 3.87–5.11)
Retic Count, Absolute: 54.2 10*3/uL (ref 19.0–186.0)
Retic Ct Pct: 1.5 % (ref 0.4–3.1)

## 2013-04-22 LAB — COMPREHENSIVE METABOLIC PANEL
ALT: 14 U/L (ref 0–35)
AST: 15 U/L (ref 0–37)
Albumin: 2.2 g/dL — ABNORMAL LOW (ref 3.5–5.2)
Alkaline Phosphatase: 81 U/L (ref 39–117)
CO2: 36 mEq/L — ABNORMAL HIGH (ref 19–32)
Calcium: 8.8 mg/dL (ref 8.4–10.5)
Potassium: 3.6 mEq/L (ref 3.5–5.1)
Sodium: 138 mEq/L (ref 135–145)
Total Bilirubin: 0.2 mg/dL — ABNORMAL LOW (ref 0.3–1.2)
Total Protein: 6.8 g/dL (ref 6.0–8.3)

## 2013-04-22 LAB — FOLATE: Folate: >20 ng/mL

## 2013-04-22 LAB — IRON AND TIBC
Iron: 19 ug/dL — ABNORMAL LOW (ref 42–135)
TIBC: 257 ug/dL (ref 250–470)

## 2013-04-22 LAB — VITAMIN B12: Vitamin B-12: 1779 pg/mL — ABNORMAL HIGH (ref 211–911)

## 2013-04-22 LAB — FERRITIN: Ferritin: 148 ng/mL (ref 10–291)
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
## 2013-04-22 NOTE — Progress Notes (Signed)
Patient coughed up a moderate amount of tan, thick sputum and stated that she didn't want to do the chest vest tonight.  I explained to the patient that it was ordered for every 4 hours and she stated along with the family that if she is resting that she didn't want to be woke up to complete the chest vest.  Patient and family stated that if she wakes up and needs the chest vest that they will call.  Advised that RT would continue to monitor.

## 2013-04-22 NOTE — Progress Notes (Signed)
Pt's family members are frequently repositioning pt in bed as she requests. Pt prefers to have her family members reposition her and perform her oral care. Will continue to offer my assistance to patient and her family.

## 2013-04-22 NOTE — Progress Notes (Signed)
Pt is asleep and by earlier request did not want to be woken for CPT. Stated to let her sleep.

## 2013-04-23 NOTE — Progress Notes (Signed)
16:55: I was called to pt's room by pt's daughter because pt said she feels "something is wrong" and was profusely sweating. Pt's O2 sat started dropping to the 70s and 60s. Pt was put on 100% O2 via a non-rebreather mask. Even with this high flow oxygen, pt's O2 stayed in the low 80s for more than 10 mins. Rapid response was called but she was unable to come. RT also called to assist. Pt's O2 sat finally increased to the 90s.  During this event, pt's daughter and I push/massage pt's abdomen to empty her bladder. I also did a digital stimulation to help pt have a BM (? Autonomic dysreflexia).   1740: pt is now having coughing spells but unable to cough up the secretion/sputum. Pt's husband has arrived and is successful helping pt cough up some thick sputum by performing abdominal thrusts.    1800: pt is now feeling better but still requires 100% O2 via the non-rebreather mask to maintain her O2 level in the 90s.  1815: O2 sat now at 100 %. RT started chest PT.   Will continue to assess and monitor pt.

## 2013-04-23 NOTE — Progress Notes (Signed)
Patient feeding was stopped at 0230, because she wanted her head to be flat.
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
## 2013-04-23 NOTE — Progress Notes (Signed)
Pt ate a sandwich from [place] according her family. Could not tell how many calories she ate.

## 2013-04-24 LAB — COMPREHENSIVE METABOLIC PANEL
AST: 16 U/L (ref 0–37)
Albumin: 2.3 g/dL — ABNORMAL LOW (ref 3.5–5.2)
Alkaline Phosphatase: 80 U/L (ref 39–117)
Chloride: 95 mEq/L — ABNORMAL LOW (ref 96–112)
Creatinine, Ser: 0.22 mg/dL — ABNORMAL LOW (ref 0.50–1.10)
Potassium: 3.4 mEq/L — ABNORMAL LOW (ref 3.5–5.1)
Total Bilirubin: 0.2 mg/dL — ABNORMAL LOW (ref 0.3–1.2)
Total Protein: 6.9 g/dL (ref 6.0–8.3)

## 2013-04-24 LAB — CBC
HCT: 34.3 % — ABNORMAL LOW (ref 36.0–46.0)
Hemoglobin: 11 g/dL — ABNORMAL LOW (ref 12.0–15.0)
MCH: 28.5 pg (ref 26.0–34.0)
MCV: 88.9 fL (ref 78.0–100.0)
Platelets: 312 10*3/uL (ref 150–400)
RBC: 3.86 MIL/uL — ABNORMAL LOW (ref 3.87–5.11)
RDW: 13.1 % (ref 11.5–15.5)
WBC: 6.1 10*3/uL (ref 4.0–10.5)

## 2013-04-24 MED ORDER — ENSURE PUDDING PO PUDG: 1.0000 | Freq: Three times a day (TID) | ORAL | Status: DC | PRN

## 2013-04-24 MED ORDER — BETHANECHOL CHLORIDE 10 MG PO TABS
10.0000 mg | ORAL_TABLET | Freq: Three times a day (TID) | ORAL | Status: DC
  Administered 2013-04-24 – 2013-04-28 (×13): 10 mg via ORAL
  Filled 2013-04-24 (×19): qty 1
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
## 2013-04-24 NOTE — Progress Notes (Signed)
Pt. Requested not to be woken up for chest vest Q4 QHS. Pt. Stated that she would call if she felt like she needed it.
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
## 2013-04-24 NOTE — Progress Notes (Signed)
Went to do chest vest, pt was eating presently.  Pt wants to wait to take vest later, states she just coughed up secretions on own, pt states her breathing is "fine".  Chest vest not indicated while pt is eating.

## 2013-04-24 NOTE — Progress Notes (Signed)
Chaplain to visit patient and family member. Offered Spiritual care to patient and family member. Patient said that already had a minister who has been visiting faithfully. Thanked for visit. Will follow up as needed.  Chaplain Jones Skene
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
## 2013-04-24 NOTE — Progress Notes (Signed)
Pt wanted her head flat, Jevity was not connected. She ate a subway sandwich for dinner and drink 4 juices during the night.

## 2013-04-24 NOTE — Progress Notes (Signed)
Pt and family are refusing the chest vest currently.  They state that the patient is now comfortable and will call if she feels the need to use the chest vest.  Advised of the importance of the procedure.  The patient understands what the chest vest does but says that she don't think that she could handle it and would call RT if needed.

## 2013-04-25 MED ORDER — BENZONATATE 100 MG PO CAPS
100.0000 mg | ORAL_CAPSULE | Freq: Two times a day (BID) | ORAL | Status: DC
  Administered 2013-04-25 – 2013-04-28 (×7): 100 mg via ORAL
  Filled 2013-04-25 (×11): qty 1

## 2013-04-25 MED ORDER — GUAIFENESIN 100 MG/5ML PO SYRP
200.0000 mg | ORAL_SOLUTION | ORAL | Status: DC | PRN
  Administered 2013-04-26: 200 mg via ORAL
  Filled 2013-04-25: qty 10

## 2013-04-25 NOTE — Progress Notes (Signed)
Pt. Refuses chest vest at this time. Pt. Stated that she would let RT know if she felt like she needed it during the night.
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
## 2013-04-25 NOTE — Consult Note (Addendum)
WOC follow-up: Pt remains with 3 areas of unstageable pressure ulcers near sacrum.  Slowly decreasing in size.  1X.3cm, 1.5X.2cm, .3X.2cm.  All 100% yellow slough, small amt yellow drainage, no odor.  Pt is extremely emaciated and with protruding spine and sacral bones.  Air mattress in place to reduce pressure.  Continue present plan of care with Santyl to chemically debride nonviable tissue and foam dressing to protect area from friction when moving up in bed. Husband at bedside states he is very familiar with wounds and has been performing topical care to this site for over 20 years; wounds will heal and then return.  He assessed area during consult and discussed plan of care, denies further questions. Please re-consult if further assistance is needed.  Thank-you,  Cammie Mcgee MSN, RN, CWOCN, [place], CNS [phone]

## 2013-04-26 MED ORDER — GABAPENTIN 600 MG PO TABS
600.0000 mg | ORAL_TABLET | ORAL | Status: DC
  Administered 2013-04-26 – 2013-04-29 (×8): 600 mg via ORAL
  Filled 2013-04-26 (×11): qty 1

## 2013-04-26 MED ORDER — TEMAZEPAM 7.5 MG PO CAPS
30.0000 mg | ORAL_CAPSULE | Freq: Every evening | ORAL | Status: DC | PRN
  Administered 2013-04-26 – 2013-04-28 (×3): 30 mg via ORAL
  Filled 2013-04-26 (×4): qty 4

## 2013-04-26 MED ORDER — BACLOFEN 20 MG PO TABS
40.0000 mg | ORAL_TABLET | Freq: Three times a day (TID) | ORAL | Status: DC
  Administered 2013-04-26 – 2013-04-29 (×8): 40 mg via ORAL
  Filled 2013-04-26 (×11): qty 2
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
## 2013-04-26 NOTE — Progress Notes (Signed)
I spoke with  [name] Andersen about her Q4 chest pt. She states that at night time she likes to rest and does not want to do her therapy. I asked if she wanted to do her therapy now but she said no. I instructed her to call at anytime if she needed anything. RT will continue to monitor.

## 2013-04-26 NOTE — Progress Notes (Signed)
C/o that her catheter was not draining. In to see. Foley cath irrigated by Janna Arch, RN and is working fine. Bladder palpated. No distention noted. Foley patent. Junious Silk, NP notified and in  To see.

## 2013-04-27 NOTE — Progress Notes (Signed)
Occupational Therapy Treatment Patient Details Name: [name] Andersen MRN: [id number] DOB: 02/05/1951 Today's Date: 04/27/2013 Time: 7846-9629 OT Time Calculation (min): 32 min  OT Assessment / Plan / Recommendation  History of present illness     OT comments  Pt is progressing well with HEP - requires supervision and min verbal cues.  Pt now does not wish for [facility] therapies at discharge.   Follow Up Recommendations  No OT follow up;Supervision/Assistance - 24 hour    Barriers to Discharge       Equipment Recommendations  None recommended by OT    Recommendations for Other Services    Frequency Min 2X/week   Progress towards OT Goals Progress towards OT goals: Progressing toward goals  Plan Discharge plan needs to be updated    Precautions / Restrictions     Pertinent Vitals/Pain     ADL  ADL Comments: Pt performed HEP with supervision and min verbal cues    OT Diagnosis:    OT Problem List:   OT Treatment Interventions:     OT Goals(current goals can now be found in the care plan section) Acute Rehab OT Goals Time For Goal Achievement: 05/01/13 Potential to Achieve Goals: Good ADL Goals Pt/caregiver will Perform Home Exercise Program: Increased strength;Both right and left upper extremity;With theraband;With minimal assist;With written HEP provided  Visit Information  Last OT Received On: 04/27/13 Assistance Needed: +1    Subjective Data      Prior Functioning       Cognition  Cognition Arousal/Alertness: Awake/alert Behavior During Therapy: WFL for tasks assessed/performed Overall Cognitive Status: Within Functional Limits for tasks assessed    Mobility       Exercises  General Exercises - Upper Extremity Elbow Flexion: Strengthening;Right;Left;10 reps;Supine (2) Theraband Level (Elbow Flexion): Level 3 (Green) Other Exercises Other Exercises: diagonal Lt. hand to Rt. hip and Rt hand to Lt hip with orange t-band 2 sets of 10 reps each UE  (2 sets 20)    Balance     End of Session OT - End of Session Activity Tolerance: Patient tolerated treatment well Patient left: in bed;with call bell/phone within reach;with family/visitor present  GO     Joanthan Hlavacek, Ursula Alert M 04/27/2013, 2:03 PM

## 2013-04-27 NOTE — Progress Notes (Signed)
[facility]                                                                                Patient Demographics  [name] Andersen, is a 62 y.o. female, DOB - 06-07-1951, XBJ:[id number]  Admit date - 04/02/2013   Admitting Physician Alyson Reedy, MD  Outpatient Primary MD for the patient is Fredirick Maudlin, MD  LOS - 25   Chief Complaint  Patient presents with  . Respiratory Distress        Assessment & Plan    Principal Problem:   Acute respiratory failure Active Problems:   Quadriplegia   Spinal cord injury, C5-C7   Protein-calorie malnutrition, severe   HCAP (healthcare-associated pneumonia)   Atelectasis  Acute hypoxic respiratory failure / Atelectasis / HCAP - LLL pneumonia  - Was ventilatory dependent -suspected to be due to mucous plugging, pneumonia and poor cough due to paralysis  - Continue mucomyst, chest percussion, nebs and O2, PRN Tussionex and tessalon perles - Levofloxacin course completed  - Will attempt to wean supplemental oxygen, currently on 4-5L  Recurrent Mucus plugging  - pt has been tolerating vibravest, and continues to improve despite occasional refusal to use vibravest (mainly during overnight periods) - desires to minimize nebs   Quadriplegia/Spinal cord injury, C5-C7  - Continue Baclofen, Neurontin and Xanax PRN - Nucynta on hold   Protein-calorie malnutrition, severe  - required QHS tube feeds initially  - oral intake improved markedly- discussed OK to eat higher fat, higher calorie foods instead of eating "right" since catabolic demands are high  - tolerating regular diet without significant complication so PANDA dc'd 11/4   Bladder discomfort  - pt usually uses Q4hr I/O cath at home during day, with indwelling foley QHS - has indwelling cath here, but feels intermittent bladder pressure "because the bladder is not emptying" - ? bladder spasms so catheter was repositioned and trial of urecholine initiated 11/3- still having some  issues which respond to bladder massage and foley tube manual drain  -per pt, husband has to push on her bladder to drain it as it will not spontaneously empty into catheter- foley changed 04/26/2013  Code Status: DNR  Family Communication: Husband at bedside  Disposition Plan: Admitted.  Home with family when stable.   Procedures None  Consults  PCCM  DVT Prophylaxis  Lovenox  Lab Results  Component Value Date   PLT 312 04/24/2013    Medications  Scheduled Meds: . antiseptic oral rinse  15 mL Mouth Rinse QID  . baclofen  40 mg Oral TID  . benzonatate  100 mg Oral BID  . bethanechol  10 mg Oral TID  . chlorhexidine  15 mL Mouth Rinse BID  . collagenase   Topical Daily  . docusate sodium  100 mg Oral Daily  . enoxaparin (LOVENOX) injection  20 mg Subcutaneous Q24H  . gabapentin  600 mg Oral Custom   Continuous Infusions:  PRN Meds:.sodium chloride, ALPRAZolam, bisacodyl, feeding supplement (ENSURE), guaifenesin, levalbuterol, metoprolol, ondansetron, sodium chloride, temazepam   Antibiotics    Anti-infectives   Start     Dose/Rate Route Frequency Ordered Stop   04/16/13 1000  levofloxacin (LEVAQUIN) tablet  500 mg  Status:  Discontinued     500 mg Per Tube Every 48 hours 04/14/13 1054 04/21/13 1418   04/12/13 1600  levofloxacin (LEVAQUIN) tablet 500 mg  Status:  Discontinued     500 mg Per Tube Daily 04/12/13 1509 04/14/13 1054   04/04/13 0600  vancomycin (VANCOCIN) IVPB 750 mg/150 ml premix  Status:  Discontinued     750 mg 150 mL/hr over 60 Minutes Intravenous Every 12 hours 04/04/13 0532 04/05/13 0925   04/04/13 0400  ceFEPIme (MAXIPIME) 1 g in dextrose 5 % 50 mL IVPB  Status:  Discontinued     1 g 100 mL/hr over 30 Minutes Intravenous Every 24 hours 04/03/13 1445 04/13/13 0800   04/04/13 0000  ceFEPIme (MAXIPIME) 2 g in dextrose 5 % 50 mL IVPB  Status:  Discontinued     2 g 100 mL/hr over 30 Minutes Intravenous Every 24 hours 04/03/13 0334 04/03/13 1445    04/03/13 0400  vancomycin (VANCOCIN) 500 mg in sodium chloride 0.9 % 100 mL IVPB  Status:  Discontinued     500 mg 100 mL/hr over 60 Minutes Intravenous Every 24 hours 04/03/13 0334 04/03/13 2222   04/03/13 0315  ceFEPIme (MAXIPIME) 2 g in dextrose 5 % 50 mL IVPB     2 g 100 mL/hr over 30 Minutes Intravenous  Once 04/03/13 0304 04/03/13 0445   04/03/13 0315  vancomycin (VANCOCIN) IVPB 1000 mg/200 mL premix  Status:  Discontinued     1,000 mg 200 mL/hr over 60 Minutes Intravenous  Once 04/03/13 0304 04/03/13 0304   04/03/13 0000  vancomycin (VANCOCIN) IVPB 1000 mg/200 mL premix     1,000 mg 200 mL/hr over 60 Minutes Intravenous  Once 04/02/13 2354 04/03/13 0135   04/03/13 0000  piperacillin-tazobactam (ZOSYN) IVPB 3.375 g     3.375 g 12.5 mL/hr over 240 Minutes Intravenous  Once 04/02/13 2354 04/03/13 0136       Time Spent in minutes   30 minutes   Shynice Sigel D.O. on 04/27/2013 at 1:06 PM  Between 7am to 7pm - Pager - [phone]  After 7pm go to [email or web address] - password [email or web address]  And look for the night coverage person covering for me after hours  [facility] Office  [phone]    Subjective:   [name] Andersen seen and examined today.  Patient still complains of cough, and does not feel that she can cough anything up.  She still does not think her breathing has improved and is frustrated. Patient denies dizziness, chest pain, abdominal pain, N/V/D/C, new weakness, numbess, tingling.    Objective:   Filed Vitals:   04/27/13 0201 04/27/13 0500 04/27/13 0524 04/27/13 1015  BP: 89/56  91/59 91/55  Pulse: 79  73 76  Temp: 98.7 F (37.1 C)  98.3 F (36.8 C) 97.7 F (36.5 C)  TempSrc: Oral  Oral Oral  Resp: 21  20 20   Height:      Weight:  36.877 kg (81 lb 4.8 oz)    SpO2: 94%  94% 94%    Wt Readings from Last 3 Encounters:  04/27/13 36.877 kg (81 lb 4.8 oz)     Intake/Output Summary (Last 24 hours) at 04/27/13 1306 Last data filed at 04/27/13 0900   Gross per 24 hour  Intake    120 ml  Output   1625 ml  Net  -1505 ml    Exam  General: Well developed, malnourished, NAD, appears stated age  HEENT: NCAT, PERRLA, EOMI,  Anicteic Sclera, mucous membranes moist.   Neck: Supple, no JVD, no masses  Cardiovascular: S1 S2 auscultated, no rubs, murmurs or gallops. Regular rate and rhythm.  Respiratory: Clear to auscultation bilaterally with equal chest rise  Abdomen: Soft, nontender, nondistended, + bowel sounds  Extremities: warm dry without cyanosis clubbing or edema  Neuro: AAOx3, cranial nerves grossly intact.   Skin: Without rashes exudates or nodules  Psych: Normal affect and demeanor with intact judgement and insight  Data Review   Micro Results No results found for this or any previous visit (from the past 240 hour(s)).  Radiology Reports Dg Chest Port 1 View  04/17/2013   CLINICAL DATA:  Followup atelectasis  EXAM: PORTABLE CHEST - 1 VIEW  COMPARISON:  04/14/2013  FINDINGS: Improved aeration at the right base. There is still a retrocardiac opacity, obscuring the left diaphragm. No edema or pneumothorax. Normal heart size. Hyperinflated lungs. Enteric tube crosses the diaphragm.  IMPRESSION: 1. Improved right lower lobe aeration compared to 3 days prior. 2. Persistent retrocardiac atelectasis or consolidation.   Electronically Signed   By: Tiburcio Pea M.D.   On: 04/17/2013 05:56   Dg Chest Port 1 View  04/14/2013   CLINICAL DATA:  Respiratory failure  EXAM: PORTABLE CHEST - 1 VIEW  COMPARISON:  April 12, 2013  FINDINGS: The central catheter has been removed. Feeding tube extends below the diaphragm. No pneumothorax.  There is a new small right pleural effusion. Slightly larger effusion on the left is stable. There is new patchy infiltrate in the right base. Elsewhere lungs are clear. Heart size is normal. Pulmonary vascularity is within normal limits. No adenopathy.  IMPRESSION: New small right pleural effusion with  patchy infiltrate right base. Stable left effusion. Central catheter removed. No pneumothorax.   Electronically Signed   By: Bretta Bang M.D.   On: 04/14/2013 07:15   Dg Chest Port 1 View  04/12/2013   CLINICAL DATA:  Respiratory failure  EXAM: PORTABLE CHEST - 1 VIEW  COMPARISON:  04/10/2013  FINDINGS: The feeding tube and right-sided central venous line are again seen and stable. The endotracheal tube is been removed in the interval. Persisting consolidation in the left retrocardiac region is noted. The lungs are otherwise clear. No sizable effusion is seen.  IMPRESSION: No significant interval change from the prior exam aside from removal of the endotracheal tube.   Electronically Signed   By: Alcide Clever M.D.   On: 04/12/2013 07:14   Dg Chest Port 1 View  04/10/2013   CLINICAL DATA:  Hypoxia  EXAM: PORTABLE CHEST - 1 VIEW  COMPARISON:  April 09, 2013  FINDINGS: The endotracheal tube tip is 2.0 cm above the Carina. Central catheter tip is in the superior vena cava and near the cavoatrial junction. Nasogastric tube tip and side port are below the diaphragm. No pneumothorax.  There is underlying emphysema. There is consolidation in the left base. There is a small left effusion. The lungs are otherwise clear. The heart size is normal. The pulmonary vascularity reflects underlying emphysema. No adenopathy.  IMPRESSION: No change from 1 day prior. Left base consolidation and effusion. Underlying emphysema. No pneumothorax.   Electronically Signed   By: Bretta Bang M.D.   On: 04/10/2013 07:39   Dg Chest Port 1 View  04/09/2013   CLINICAL DATA:  Respiratory distress.  EXAM: PORTABLE CHEST - 1 VIEW  COMPARISON:  04/08/2013.  FINDINGS: The support apparatus is in good position, unchanged. The cardiac silhouette, mediastinal and  hilar contours are stable. The lungs demonstrate stable emphysematous changes and there is persistent left basilar atelectasis or infiltrate. A small left pleural  effusion is also noted. No pneumothorax.  IMPRESSION: Stable support apparatus.  Persistent left basilar atelectasis and small left effusion.   Electronically Signed   By: Loralie Champagne M.D.   On: 04/09/2013 07:37   Dg Chest Port 1 View  04/08/2013   CLINICAL DATA:  Chest congestion. Intubated patient.  EXAM: PORTABLE CHEST - 1 VIEW  COMPARISON:  04/07/2013  FINDINGS: Endotracheal tube, enteric tube and right internal jugular central venous line are stable in well positioned.  Apical parenchymal scarring is stable. Mild lung base opacity, greater on the left, is also stable likely atelectasis. No convincing infiltrate or edema. No pneumothorax.  IMPRESSION: 1. No change from the previous day's study. 2. Support apparatus is stable in well positioned.   Electronically Signed   By: Amie Portland M.D.   On: 04/08/2013 08:31   Dg Chest Port 1 View  04/07/2013   CLINICAL DATA:  Left lung mucous plugging.  EXAM: PORTABLE CHEST - 1 VIEW  COMPARISON:  04/06/2013  FINDINGS: Endotracheal tube is roughly 1.7 cm above the carina. Central line tip is in the lower SVC region. Nasogastric tube extends into the abdomen. There is markedly improved aeration in the left lower chest. Heart size is within normal limits. No evidence for a pneumothorax.  IMPRESSION: Markedly improved aeration in the left lung. Findings are compatible with history of mucous plugging and resolution of the mucous plugging.  Support apparatuses as described.   Electronically Signed   By: Richarda Overlie M.D.   On: 04/07/2013 07:53   Dg Chest Port 1 View  04/06/2013   CLINICAL DATA:  Endotracheal tube placement.  EXAM: PORTABLE CHEST - 1 VIEW  COMPARISON:  Film earlier this day  FINDINGS: An endotracheal tube is in place with tip 1.2 cm above the carina.  A right IJ central venous catheter with tip overlying the lower SVC noted.  An NG tube entering the stomach with tip off the field of view is present.  A left pleural effusion and left lower lung  atelectasis/ consolidation again noted.  There is no evidence of pneumothorax.  IMPRESSION: Endotracheal tube with tip 1.2 cm above the carina. Persistent left lower lung atelectasis and airspace disease/consolidation with probable small left pleural effusion.   Electronically Signed   By: Laveda Abbe M.D.   On: 04/06/2013 21:26   Dg Chest Port 1 View  04/05/2013   CLINICAL DATA:  Pneumonia  EXAM: PORTABLE CHEST - 1 VIEW  COMPARISON:  04/04/2013  FINDINGS: Endotracheal tube in good position. NG tube in the stomach. Right jugular catheter tip in the SVC. No pneumothorax.  COPD with hyperinflation. Left lower lobe airspace disease has progressed. This may represent atelectasis. Right lung remains clear.  IMPRESSION: Support lines remain in good position  Progressive left lower lobe airspace disease.   Electronically Signed   By: Marlan Palau M.D.   On: 04/05/2013 07:21   Dg Chest Port 1 View  04/04/2013   *RADIOLOGY REPORT*  Clinical Data: Check endotracheal tube placement.  PORTABLE CHEST - 1 VIEW  Comparison: Chest radiograph April 03, 2013.  Findings: Endotracheal tube tip projects 3.8 cm above the carina. Right internal jugular central venous catheter with distal tip projecting cavoatrial junction.  Nasogastric tube in place, side port projecting immediately distal to the gastroesophageal junction. Multiple EKG lines overlay the patient and could  obscure underlying subtle pathology.  Cardiac silhouette appears mildly enlarged, mediastinal silhouette is unremarkable.  Similar pulmonary hyperexpansion with chronic interstitial changes in the retrocardiac air space opacity.  No pleural effusions.  No pneumothorax.  IMPRESSION: No apparent change in life support lines, endotracheal tube tip projects 3.8 cm above the carina.  Stable appearance of chest:  Mild chronic interstitial changes, retrocardiac air space opacity and mild cardiomegaly.   Original Report Authenticated By: Awilda Metro   Dg Chest  Port 1 View  04/03/2013   CLINICAL DATA:  Right IJ catheter placement.  EXAM: PORTABLE CHEST - 1 VIEW  COMPARISON:  Portable film earlier in the day.  FINDINGS: Right IJ catheter tip mid SVC. No pneumothorax. Rotated radiograph. ET tube 4.5 cm above carina. No effusion. Grossly stable aeration.  IMPRESSION: Right IJ catheter tip mid SVC. No pneumothorax   Electronically Signed   By: Davonna Belling M.D.   On: 04/03/2013 17:25   Dg Chest Portable 1 View  04/03/2013   CLINICAL DATA:  Pneumonia and possible sepsis.  EXAM: PORTABLE CHEST - 1 VIEW  COMPARISON:  04/03/2013.  FINDINGS: Endotracheal tube ends 3 cm above the carina. An enteric tube crosses the diaphragm, with side port near the expected gastroesophageal junction. Unchanged positioning of right IJ catheter.  Stable heart size and mediastinal contours. Improved left lung aeration with persistent left base opacity. No evidence of effusion or pneumothorax.  IMPRESSION: 1. New enteric tube reaches the stomach. 2. Remaining support apparatus in unchanged position. 3. Improved left lung aeration.   Electronically Signed   By: Tiburcio Pea M.D.   On: 04/03/2013 03:43   Dg Chest Portable 1 View  04/03/2013   *RADIOLOGY REPORT*  Clinical Data: Line placement.  PORTABLE CHEST - 1 VIEW  Comparison: Chest radiograph April 02, 2013 at 2322 hours.  Findings: Endotracheal tube tip projects at 3.2 cm above the carina, unchanged.  Interval placement right internal jugular central venous catheter, with distal tip projecting back to brachiocephalic confluence.  No pneumothorax.  Cardiac silhouette appears moderately enlarged, diffuse interstitial prominence with left perihilar, left lower lobe air space opacity again noted.  No pleural effusions.  Soft tissue planes and included osseous structures are not suspicious; cerclage wires within the included cervical spine.  IMPRESSION: Right internal jugular central venous catheter tip projects at brachiocephalic  confluence.  No apparent change in endotracheal tube.  No pneumothorax.  Stable cardiomegaly with left perihilar and left lower lobe air space opacity, which could reflect pneumonia.  Recommend follow-up chest radiograph after treatment to verify improvement.   Original Report Authenticated By: Awilda Metro   Dg Chest Portable 1 View  04/02/2013   CLINICAL DATA:  Endotracheal tube placement  EXAM: PORTABLE CHEST - 1 VIEW  COMPARISON:  03/18/2007  FINDINGS: Endotracheal tube ends 4 cm above the carina.  COPD with diffuse bronchial wall thickening and hyperinflation. There is new multi focal left lung opacity with mild volume loss. No definite effusion or pneumothorax.  Cervical spine cerclage wires are unremarkable in the frontal projection.  IMPRESSION: 1. Good positioning of endotracheal tube. 2. Multi focal left lung infiltrate, most likely pneumonia. 3. COPD.   Electronically Signed   By: Tiburcio Pea M.D.   On: 04/02/2013 23:46   Dg Chest Port 1v Same Day  04/06/2013   CLINICAL DATA:  Increasing rhonchoruos breath sounds.  EXAM: PORTABLE CHEST - 1 VIEW SAME DAY  COMPARISON:  CHEST x-ray 04/05/2013.  FINDINGS: The patient has been extubated and  nasogastric tube is been removed. There is a right-sided internal jugular central venous catheter with tip terminating in the distal superior vena cava. Compared to the prior study, there is near complete whiteout of the entire left hemithorax with right-to-left shift of cardiomediastinal structures, predominantly related to atelectasis in the left lung, however, underlying airspace consolidation from infection or aspiration is not excluded. Probable superimposed moderate left pleural effusion. Right lung appears clear. No evidence of pulmonary edema. Postoperative changes in the lower cervical spine.  IMPRESSION: 1. Support apparatus, as above. 2. Interval development of significant atelectasis throughout the majority of the left lung. Superimposed airspace  consolidation from infection or aspiration is not excluded, and there is likely a superimposed moderate left pleural effusion.   Electronically Signed   By: Trudie Reed M.D.   On: 04/06/2013 19:40   Dg Abd Portable 1v  04/20/2013   CLINICAL DATA:  Feeding tube placement  EXAM: PORTABLE ABDOMEN - 1 VIEW  COMPARISON:  Prior abdominal radiograph 04/10/2013  FINDINGS: Weighted tip enteric feeding tube projects over the mid gastric body. No evidence of obstruction. No acute osseous abnormality. Surgical changes suggest prior cholecystectomy.  IMPRESSION: The tip of the weighted enteric feeding tube is in the mid stomach. Recommend advancing if transpyloric placement is desired.   Electronically Signed   By: Malachy Moan M.D.   On: 04/20/2013 19:57   Dg Abd Portable 1v  04/10/2013   CLINICAL DATA:  Feeding tube placement  EXAM: PORTABLE ABDOMEN - 1 VIEW  COMPARISON:  April 06, 2013  FINDINGS: Feeding tube tip is in the region of the distal stomach. The bowel gas pattern is normal. There is consolidation in the medial left lung base.  IMPRESSION:  FEEDING TUBE TIP IN DISTAL STOMACH. GUIDEWIRE REMAINS IN PLACE.   Electronically Signed   By: Bretta Bang M.D.   On: 04/10/2013 15:35   Dg Abd Portable 1v  04/06/2013   CLINICAL DATA:  Orogastric tube placement.  EXAM: PORTABLE ABDOMEN - 1 VIEW  COMPARISON:  None.  FINDINGS: The bowel gas pattern is normal. No radio-opaque calculi or other significant radiographic abnormality are seen. Orogastric tube tip lies in the mid body stomach. Prior cholecystectomy.  IMPRESSION: No acute abnormality.   Electronically Signed   By: Davonna Belling M.D.   On: 04/06/2013 21:32    CBC  Recent Labs Lab 04/24/13 0447  WBC 6.1  HGB 11.0*  HCT 34.3*  PLT 312  MCV 88.9  MCH 28.5  MCHC 32.1  RDW 13.1    Chemistries   Recent Labs Lab 04/22/13 0602 04/24/13 0447  NA 138 139  K 3.6 3.4*  CL 94* 95*  CO2 36* 39*  GLUCOSE 150* 108*  BUN 12 13   CREATININE <0.20* 0.22*  CALCIUM 8.8 8.8  AST 15 16  ALT 14 13  ALKPHOS 81 80  BILITOT 0.2* 0.2*   ------------------------------------------------------------------------------------------------------------------ estimated creatinine clearance is 43.6 ml/min (by C-G formula based on Cr of 0.22). ------------------------------------------------------------------------------------------------------------------ No results found for this basename: HGBA1C,  in the last 72 hours ------------------------------------------------------------------------------------------------------------------ No results found for this basename: CHOL, HDL, LDLCALC, TRIG, CHOLHDL, LDLDIRECT,  in the last 72 hours ------------------------------------------------------------------------------------------------------------------ No results found for this basename: TSH, T4TOTAL, FREET3, T3FREE, THYROIDAB,  in the last 72 hours ------------------------------------------------------------------------------------------------------------------ No results found for this basename: VITAMINB12, FOLATE, FERRITIN, TIBC, IRON, RETICCTPCT,  in the last 72 hours  Coagulation profile No results found for this basename: INR, PROTIME,  in the last 168 hours  No results found for this basename: DDIMER,  in the last 72 hours  Cardiac Enzymes No results found for this basename: CK, CKMB, TROPONINI, MYOGLOBIN,  in the last 168 hours ------------------------------------------------------------------------------------------------------------------ No components found with this basename: POCBNP,

## 2013-04-28 NOTE — Progress Notes (Signed)
[facility]                                                                                Patient Demographics  [name] Andersen, is a 62 y.o. female, DOB - 10-02-1950, WUJ:[id number]  Admit date - 04/02/2013   Admitting Physician Alyson Reedy, MD  Outpatient Primary MD for the patient is Fredirick Maudlin, MD  LOS - 26   Chief Complaint  Patient presents with  . Respiratory Distress        Assessment & Plan    Principal Problem:   Acute respiratory failure Active Problems:   Quadriplegia   Spinal cord injury, C5-C7   Protein-calorie malnutrition, severe   HCAP (healthcare-associated pneumonia)   Atelectasis  Acute hypoxic respiratory failure / Atelectasis / HCAP - LLL pneumonia  - Was ventilatory dependent, much improved -suspected to be due to mucous plugging, pneumonia and poor cough due to paralysis  - Continue mucomyst, chest percussion, nebs and O2, PRN Tussionex and tessalon perles - Levofloxacin course completed  - Will attempt to wean supplemental oxygen, currently on 3-4L  Recurrent Mucus plugging  - pt has been tolerating vibravest, and continues to improve despite occasional refusal to use vibravest (mainly during overnight periods) - desires to minimize nebs   Quadriplegia/Spinal cord injury, C5-C7  - Continue Baclofen, Neurontin and Xanax PRN - Nucynta on hold   Protein-calorie malnutrition, severe  - required QHS tube feeds initially  - oral intake improved markedly- discussed OK to eat higher fat, higher calorie foods instead of eating "right" since catabolic demands are high  - tolerating regular diet without significant complication so PANDA dc'd 11/4   Bladder discomfort  - pt usually uses Q4hr I/O cath at home during day, with indwelling foley QHS - has indwelling cath here, but feels intermittent bladder pressure "because the bladder is not emptying" - ? bladder spasms so catheter was repositioned and trial of urecholine initiated 11/3- still  having some issues which respond to bladder massage and foley tube manual drain  -per pt, husband has to push on her bladder to drain it as it will not spontaneously empty into catheter- foley changed 04/26/2013  Code Status: DNR  Family Communication: Husband at bedside  Disposition Plan: Admitted.  Home with family when stable.  Will likely discharge patient to home on 04/29/2013 once able to wean oxygen needs.   Procedures None  Consults  PCCM  DVT Prophylaxis  Lovenox  Lab Results  Component Value Date   PLT 312 04/24/2013    Medications  Scheduled Meds: . antiseptic oral rinse  15 mL Mouth Rinse QID  . baclofen  40 mg Oral TID  . benzonatate  100 mg Oral BID  . bethanechol  10 mg Oral TID  . chlorhexidine  15 mL Mouth Rinse BID  . collagenase   Topical Daily  . docusate sodium  100 mg Oral Daily  . enoxaparin (LOVENOX) injection  20 mg Subcutaneous Q24H  . gabapentin  600 mg Oral Custom   Continuous Infusions:  PRN Meds:.sodium chloride, ALPRAZolam, bisacodyl, feeding supplement (ENSURE), guaifenesin, levalbuterol, metoprolol, ondansetron, sodium chloride, temazepam   Antibiotics    Anti-infectives   Start  Dose/Rate Route Frequency Ordered Stop   04/16/13 1000  levofloxacin (LEVAQUIN) tablet 500 mg  Status:  Discontinued     500 mg Per Tube Every 48 hours 04/14/13 1054 04/21/13 1418   04/12/13 1600  levofloxacin (LEVAQUIN) tablet 500 mg  Status:  Discontinued     500 mg Per Tube Daily 04/12/13 1509 04/14/13 1054   04/04/13 0600  vancomycin (VANCOCIN) IVPB 750 mg/150 ml premix  Status:  Discontinued     750 mg 150 mL/hr over 60 Minutes Intravenous Every 12 hours 04/04/13 0532 04/05/13 0925   04/04/13 0400  ceFEPIme (MAXIPIME) 1 g in dextrose 5 % 50 mL IVPB  Status:  Discontinued     1 g 100 mL/hr over 30 Minutes Intravenous Every 24 hours 04/03/13 1445 04/13/13 0800   04/04/13 0000  ceFEPIme (MAXIPIME) 2 g in dextrose 5 % 50 mL IVPB  Status:  Discontinued      2 g 100 mL/hr over 30 Minutes Intravenous Every 24 hours 04/03/13 0334 04/03/13 1445   04/03/13 0400  vancomycin (VANCOCIN) 500 mg in sodium chloride 0.9 % 100 mL IVPB  Status:  Discontinued     500 mg 100 mL/hr over 60 Minutes Intravenous Every 24 hours 04/03/13 0334 04/03/13 2222   04/03/13 0315  ceFEPIme (MAXIPIME) 2 g in dextrose 5 % 50 mL IVPB     2 g 100 mL/hr over 30 Minutes Intravenous  Once 04/03/13 0304 04/03/13 0445   04/03/13 0315  vancomycin (VANCOCIN) IVPB 1000 mg/200 mL premix  Status:  Discontinued     1,000 mg 200 mL/hr over 60 Minutes Intravenous  Once 04/03/13 0304 04/03/13 0304   04/03/13 0000  vancomycin (VANCOCIN) IVPB 1000 mg/200 mL premix     1,000 mg 200 mL/hr over 60 Minutes Intravenous  Once 04/02/13 2354 04/03/13 0135   04/03/13 0000  piperacillin-tazobactam (ZOSYN) IVPB 3.375 g     3.375 g 12.5 mL/hr over 240 Minutes Intravenous  Once 04/02/13 2354 04/03/13 0136       Time Spent in minutes   20 minutes   Ilona Colley D.O. on 04/28/2013 at 11:21 AM  Between 7am to 7pm - Pager - [phone]  After 7pm go to [email or web address] - password [email or web address]  And look for the night coverage person covering for me after hours  [facility] Office  [phone]    Subjective:   [name] Andersen seen and examined today. Patient states she feels improved as compared to prior days.  She states she was able to sleep through the night.  She still complains of cough, but states it has been ongoing for years.  She would like to sit up in the bed. Patient denies dizziness, chest pain, abdominal pain, N/V/D/C, new weakness, numbess, tingling.    Objective:   Filed Vitals:   04/27/13 2200 04/28/13 0149 04/28/13 0600 04/28/13 0821  BP: 89/53 82/64 124/101   Pulse: 77 75 86   Temp: 98.4 F (36.9 C) 97.2 F (36.2 C) 97.3 F (36.3 C)   TempSrc: Oral Axillary Tympanic   Resp: 20 18 16    Height:      Weight:      SpO2: 94% 98% 97% 95%    Wt Readings from Last 3  Encounters:  04/27/13 36.877 kg (81 lb 4.8 oz)     Intake/Output Summary (Last 24 hours) at 04/28/13 1121 Last data filed at 04/28/13 0800  Gross per 24 hour  Intake    660 ml  Output   1875  ml  Net  -1215 ml    Exam  General: Well developed, malnourished, NAD, appears stated age  HEENT: NCAT, mucous membranes moist.   Neck: Supple, no JVD, no masses  Cardiovascular: S1 S2 auscultated, regular rate and rhythm.  Respiratory: Clear to auscultation bilaterally with equal chest rise  Abdomen: Soft, nontender, nondistended, + bowel sounds  Extremities: warm dry without cyanosis clubbing or edema  Neuro: AAOx3, cranial nerves grossly intact.   Skin: Without rashes exudates or nodules  Psych: Normal affect and demeanor with intact judgement and insight  Data Review   Micro Results No results found for this or any previous visit (from the past 240 hour(s)).  Radiology Reports Dg Chest Port 1 View  04/17/2013   CLINICAL DATA:  Followup atelectasis  EXAM: PORTABLE CHEST - 1 VIEW  COMPARISON:  04/14/2013  FINDINGS: Improved aeration at the right base. There is still a retrocardiac opacity, obscuring the left diaphragm. No edema or pneumothorax. Normal heart size. Hyperinflated lungs. Enteric tube crosses the diaphragm.  IMPRESSION: 1. Improved right lower lobe aeration compared to 3 days prior. 2. Persistent retrocardiac atelectasis or consolidation.   Electronically Signed   By: Tiburcio Pea M.D.   On: 04/17/2013 05:56   Dg Chest Port 1 View  04/14/2013   CLINICAL DATA:  Respiratory failure  EXAM: PORTABLE CHEST - 1 VIEW  COMPARISON:  April 12, 2013  FINDINGS: The central catheter has been removed. Feeding tube extends below the diaphragm. No pneumothorax.  There is a new small right pleural effusion. Slightly larger effusion on the left is stable. There is new patchy infiltrate in the right base. Elsewhere lungs are clear. Heart size is normal. Pulmonary vascularity is  within normal limits. No adenopathy.  IMPRESSION: New small right pleural effusion with patchy infiltrate right base. Stable left effusion. Central catheter removed. No pneumothorax.   Electronically Signed   By: Bretta Bang M.D.   On: 04/14/2013 07:15   Dg Chest Port 1 View  04/12/2013   CLINICAL DATA:  Respiratory failure  EXAM: PORTABLE CHEST - 1 VIEW  COMPARISON:  04/10/2013  FINDINGS: The feeding tube and right-sided central venous line are again seen and stable. The endotracheal tube is been removed in the interval. Persisting consolidation in the left retrocardiac region is noted. The lungs are otherwise clear. No sizable effusion is seen.  IMPRESSION: No significant interval change from the prior exam aside from removal of the endotracheal tube.   Electronically Signed   By: Alcide Clever M.D.   On: 04/12/2013 07:14   Dg Chest Port 1 View  04/10/2013   CLINICAL DATA:  Hypoxia  EXAM: PORTABLE CHEST - 1 VIEW  COMPARISON:  April 09, 2013  FINDINGS: The endotracheal tube tip is 2.0 cm above the Carina. Central catheter tip is in the superior vena cava and near the cavoatrial junction. Nasogastric tube tip and side port are below the diaphragm. No pneumothorax.  There is underlying emphysema. There is consolidation in the left base. There is a small left effusion. The lungs are otherwise clear. The heart size is normal. The pulmonary vascularity reflects underlying emphysema. No adenopathy.  IMPRESSION: No change from 1 day prior. Left base consolidation and effusion. Underlying emphysema. No pneumothorax.   Electronically Signed   By: Bretta Bang M.D.   On: 04/10/2013 07:39   Dg Chest Port 1 View  04/09/2013   CLINICAL DATA:  Respiratory distress.  EXAM: PORTABLE CHEST - 1 VIEW  COMPARISON:  04/08/2013.  FINDINGS: The support apparatus is in good position, unchanged. The cardiac silhouette, mediastinal and hilar contours are stable. The lungs demonstrate stable emphysematous changes and  there is persistent left basilar atelectasis or infiltrate. A small left pleural effusion is also noted. No pneumothorax.  IMPRESSION: Stable support apparatus.  Persistent left basilar atelectasis and small left effusion.   Electronically Signed   By: Loralie Champagne M.D.   On: 04/09/2013 07:37   Dg Chest Port 1 View  04/08/2013   CLINICAL DATA:  Chest congestion. Intubated patient.  EXAM: PORTABLE CHEST - 1 VIEW  COMPARISON:  04/07/2013  FINDINGS: Endotracheal tube, enteric tube and right internal jugular central venous line are stable in well positioned.  Apical parenchymal scarring is stable. Mild lung base opacity, greater on the left, is also stable likely atelectasis. No convincing infiltrate or edema. No pneumothorax.  IMPRESSION: 1. No change from the previous day's study. 2. Support apparatus is stable in well positioned.   Electronically Signed   By: Amie Portland M.D.   On: 04/08/2013 08:31   Dg Chest Port 1 View  04/07/2013   CLINICAL DATA:  Left lung mucous plugging.  EXAM: PORTABLE CHEST - 1 VIEW  COMPARISON:  04/06/2013  FINDINGS: Endotracheal tube is roughly 1.7 cm above the carina. Central line tip is in the lower SVC region. Nasogastric tube extends into the abdomen. There is markedly improved aeration in the left lower chest. Heart size is within normal limits. No evidence for a pneumothorax.  IMPRESSION: Markedly improved aeration in the left lung. Findings are compatible with history of mucous plugging and resolution of the mucous plugging.  Support apparatuses as described.   Electronically Signed   By: Richarda Overlie M.D.   On: 04/07/2013 07:53   Dg Chest Port 1 View  04/06/2013   CLINICAL DATA:  Endotracheal tube placement.  EXAM: PORTABLE CHEST - 1 VIEW  COMPARISON:  Film earlier this day  FINDINGS: An endotracheal tube is in place with tip 1.2 cm above the carina.  A right IJ central venous catheter with tip overlying the lower SVC noted.  An NG tube entering the stomach with tip  off the field of view is present.  A left pleural effusion and left lower lung atelectasis/ consolidation again noted.  There is no evidence of pneumothorax.  IMPRESSION: Endotracheal tube with tip 1.2 cm above the carina. Persistent left lower lung atelectasis and airspace disease/consolidation with probable small left pleural effusion.   Electronically Signed   By: Laveda Abbe M.D.   On: 04/06/2013 21:26   Dg Chest Port 1 View  04/05/2013   CLINICAL DATA:  Pneumonia  EXAM: PORTABLE CHEST - 1 VIEW  COMPARISON:  04/04/2013  FINDINGS: Endotracheal tube in good position. NG tube in the stomach. Right jugular catheter tip in the SVC. No pneumothorax.  COPD with hyperinflation. Left lower lobe airspace disease has progressed. This may represent atelectasis. Right lung remains clear.  IMPRESSION: Support lines remain in good position  Progressive left lower lobe airspace disease.   Electronically Signed   By: Marlan Palau M.D.   On: 04/05/2013 07:21   Dg Chest Port 1 View  04/04/2013   *RADIOLOGY REPORT*  Clinical Data: Check endotracheal tube placement.  PORTABLE CHEST - 1 VIEW  Comparison: Chest radiograph April 03, 2013.  Findings: Endotracheal tube tip projects 3.8 cm above the carina. Right internal jugular central venous catheter with distal tip projecting cavoatrial junction.  Nasogastric tube in place, side port projecting  immediately distal to the gastroesophageal junction. Multiple EKG lines overlay the patient and could obscure underlying subtle pathology.  Cardiac silhouette appears mildly enlarged, mediastinal silhouette is unremarkable.  Similar pulmonary hyperexpansion with chronic interstitial changes in the retrocardiac air space opacity.  No pleural effusions.  No pneumothorax.  IMPRESSION: No apparent change in life support lines, endotracheal tube tip projects 3.8 cm above the carina.  Stable appearance of chest:  Mild chronic interstitial changes, retrocardiac air space opacity and mild  cardiomegaly.   Original Report Authenticated By: Awilda Metro   Dg Chest Port 1 View  04/03/2013   CLINICAL DATA:  Right IJ catheter placement.  EXAM: PORTABLE CHEST - 1 VIEW  COMPARISON:  Portable film earlier in the day.  FINDINGS: Right IJ catheter tip mid SVC. No pneumothorax. Rotated radiograph. ET tube 4.5 cm above carina. No effusion. Grossly stable aeration.  IMPRESSION: Right IJ catheter tip mid SVC. No pneumothorax   Electronically Signed   By: Davonna Belling M.D.   On: 04/03/2013 17:25   Dg Chest Portable 1 View  04/03/2013   CLINICAL DATA:  Pneumonia and possible sepsis.  EXAM: PORTABLE CHEST - 1 VIEW  COMPARISON:  04/03/2013.  FINDINGS: Endotracheal tube ends 3 cm above the carina. An enteric tube crosses the diaphragm, with side port near the expected gastroesophageal junction. Unchanged positioning of right IJ catheter.  Stable heart size and mediastinal contours. Improved left lung aeration with persistent left base opacity. No evidence of effusion or pneumothorax.  IMPRESSION: 1. New enteric tube reaches the stomach. 2. Remaining support apparatus in unchanged position. 3. Improved left lung aeration.   Electronically Signed   By: Tiburcio Pea M.D.   On: 04/03/2013 03:43   Dg Chest Portable 1 View  04/03/2013   *RADIOLOGY REPORT*  Clinical Data: Line placement.  PORTABLE CHEST - 1 VIEW  Comparison: Chest radiograph April 02, 2013 at 2322 hours.  Findings: Endotracheal tube tip projects at 3.2 cm above the carina, unchanged.  Interval placement right internal jugular central venous catheter, with distal tip projecting back to brachiocephalic confluence.  No pneumothorax.  Cardiac silhouette appears moderately enlarged, diffuse interstitial prominence with left perihilar, left lower lobe air space opacity again noted.  No pleural effusions.  Soft tissue planes and included osseous structures are not suspicious; cerclage wires within the included cervical spine.  IMPRESSION: Right  internal jugular central venous catheter tip projects at brachiocephalic confluence.  No apparent change in endotracheal tube.  No pneumothorax.  Stable cardiomegaly with left perihilar and left lower lobe air space opacity, which could reflect pneumonia.  Recommend follow-up chest radiograph after treatment to verify improvement.   Original Report Authenticated By: Awilda Metro   Dg Chest Portable 1 View  04/02/2013   CLINICAL DATA:  Endotracheal tube placement  EXAM: PORTABLE CHEST - 1 VIEW  COMPARISON:  03/18/2007  FINDINGS: Endotracheal tube ends 4 cm above the carina.  COPD with diffuse bronchial wall thickening and hyperinflation. There is new multi focal left lung opacity with mild volume loss. No definite effusion or pneumothorax.  Cervical spine cerclage wires are unremarkable in the frontal projection.  IMPRESSION: 1. Good positioning of endotracheal tube. 2. Multi focal left lung infiltrate, most likely pneumonia. 3. COPD.   Electronically Signed   By: Tiburcio Pea M.D.   On: 04/02/2013 23:46   Dg Chest Port 1v Same Day  04/06/2013   CLINICAL DATA:  Increasing rhonchoruos breath sounds.  EXAM: PORTABLE CHEST - 1 VIEW SAME DAY  COMPARISON:  CHEST x-ray 04/05/2013.  FINDINGS: The patient has been extubated and nasogastric tube is been removed. There is a right-sided internal jugular central venous catheter with tip terminating in the distal superior vena cava. Compared to the prior study, there is near complete whiteout of the entire left hemithorax with right-to-left shift of cardiomediastinal structures, predominantly related to atelectasis in the left lung, however, underlying airspace consolidation from infection or aspiration is not excluded. Probable superimposed moderate left pleural effusion. Right lung appears clear. No evidence of pulmonary edema. Postoperative changes in the lower cervical spine.  IMPRESSION: 1. Support apparatus, as above. 2. Interval development of significant  atelectasis throughout the majority of the left lung. Superimposed airspace consolidation from infection or aspiration is not excluded, and there is likely a superimposed moderate left pleural effusion.   Electronically Signed   By: Trudie Reed M.D.   On: 04/06/2013 19:40   Dg Abd Portable 1v  04/20/2013   CLINICAL DATA:  Feeding tube placement  EXAM: PORTABLE ABDOMEN - 1 VIEW  COMPARISON:  Prior abdominal radiograph 04/10/2013  FINDINGS: Weighted tip enteric feeding tube projects over the mid gastric body. No evidence of obstruction. No acute osseous abnormality. Surgical changes suggest prior cholecystectomy.  IMPRESSION: The tip of the weighted enteric feeding tube is in the mid stomach. Recommend advancing if transpyloric placement is desired.   Electronically Signed   By: Malachy Moan M.D.   On: 04/20/2013 19:57   Dg Abd Portable 1v  04/10/2013   CLINICAL DATA:  Feeding tube placement  EXAM: PORTABLE ABDOMEN - 1 VIEW  COMPARISON:  April 06, 2013  FINDINGS: Feeding tube tip is in the region of the distal stomach. The bowel gas pattern is normal. There is consolidation in the medial left lung base.  IMPRESSION:  FEEDING TUBE TIP IN DISTAL STOMACH. GUIDEWIRE REMAINS IN PLACE.   Electronically Signed   By: Bretta Bang M.D.   On: 04/10/2013 15:35   Dg Abd Portable 1v  04/06/2013   CLINICAL DATA:  Orogastric tube placement.  EXAM: PORTABLE ABDOMEN - 1 VIEW  COMPARISON:  None.  FINDINGS: The bowel gas pattern is normal. No radio-opaque calculi or other significant radiographic abnormality are seen. Orogastric tube tip lies in the mid body stomach. Prior cholecystectomy.  IMPRESSION: No acute abnormality.   Electronically Signed   By: Davonna Belling M.D.   On: 04/06/2013 21:32    CBC  Recent Labs Lab 04/24/13 0447  WBC 6.1  HGB 11.0*  HCT 34.3*  PLT 312  MCV 88.9  MCH 28.5  MCHC 32.1  RDW 13.1    Chemistries   Recent Labs Lab 04/22/13 0602 04/24/13 0447  NA 138 139   K 3.6 3.4*  CL 94* 95*  CO2 36* 39*  GLUCOSE 150* 108*  BUN 12 13  CREATININE <0.20* 0.22*  CALCIUM 8.8 8.8  AST 15 16  ALT 14 13  ALKPHOS 81 80  BILITOT 0.2* 0.2*   ------------------------------------------------------------------------------------------------------------------ estimated creatinine clearance is 43.6 ml/min (by C-G formula based on Cr of 0.22). ------------------------------------------------------------------------------------------------------------------ No results found for this basename: HGBA1C,  in the last 72 hours ------------------------------------------------------------------------------------------------------------------ No results found for this basename: CHOL, HDL, LDLCALC, TRIG, CHOLHDL, LDLDIRECT,  in the last 72 hours ------------------------------------------------------------------------------------------------------------------ No results found for this basename: TSH, T4TOTAL, FREET3, T3FREE, THYROIDAB,  in the last 72 hours ------------------------------------------------------------------------------------------------------------------ No results found for this basename: VITAMINB12, FOLATE, FERRITIN, TIBC, IRON, RETICCTPCT,  in the last 72 hours  Coagulation profile No results  found for this basename: INR, PROTIME,  in the last 168 hours  No results found for this basename: DDIMER,  in the last 72 hours  Cardiac Enzymes No results found for this basename: CK, CKMB, TROPONINI, MYOGLOBIN,  in the last 168 hours ------------------------------------------------------------------------------------------------------------------ No components found with this basename: POCBNP,

## 2013-04-29 MED ORDER — COLLAGENASE 250 UNIT/GM EX OINT: TOPICAL_OINTMENT | Freq: Every day | CUTANEOUS | Status: DC

## 2013-04-29 MED ORDER — BETHANECHOL CHLORIDE 10 MG PO TABS: 10.0000 mg | ORAL_TABLET | Freq: Three times a day (TID) | ORAL | Status: DC

## 2013-04-29 MED ORDER — GUAIFENESIN 100 MG/5ML PO SYRP: 200.0000 mg | ORAL_SOLUTION | ORAL | Status: DC | PRN

## 2013-04-29 MED ORDER — ENSURE PUDDING PO PUDG: 1.0000 | Freq: Three times a day (TID) | ORAL | Status: DC | PRN

## 2013-04-29 MED ORDER — BENZONATATE 100 MG PO CAPS: 100.0000 mg | ORAL_CAPSULE | Freq: Two times a day (BID) | ORAL | Status: DC
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
## 2013-04-29 NOTE — Progress Notes (Signed)
Pt refused CPT vest. She said she was in pain and did not want to do it at this time. RT made pt aware that if she changed her mind to please call.

## 2013-04-29 NOTE — Progress Notes (Signed)
PRN restoril given at 2242 and effective. Anxious to get foley out and restart I&0 cath's. Checked rectum per patient's request, performed dig stim without results. Husband at bedside. Alfredo Martinez A

## 2013-11-02 ENCOUNTER — Encounter (INDEPENDENT_AMBULATORY_CARE_PROVIDER_SITE_OTHER): Payer: Self-pay | Admitting: *Deleted

## 2013-11-10 ENCOUNTER — Encounter (INDEPENDENT_AMBULATORY_CARE_PROVIDER_SITE_OTHER): Payer: Self-pay | Admitting: *Deleted

## 2013-12-11 ENCOUNTER — Encounter (INDEPENDENT_AMBULATORY_CARE_PROVIDER_SITE_OTHER): Payer: Self-pay | Admitting: *Deleted

## 2013-12-12 ENCOUNTER — Ambulatory Visit (INDEPENDENT_AMBULATORY_CARE_PROVIDER_SITE_OTHER): Payer: BC Managed Care – PPO | Admitting: Internal Medicine

## 2013-12-19 ENCOUNTER — Ambulatory Visit (INDEPENDENT_AMBULATORY_CARE_PROVIDER_SITE_OTHER): Payer: BC Managed Care – PPO | Admitting: Internal Medicine

## 2013-12-19 ENCOUNTER — Encounter (INDEPENDENT_AMBULATORY_CARE_PROVIDER_SITE_OTHER): Payer: Self-pay | Admitting: Internal Medicine

## 2013-12-19 VITALS — BP 90/0 | HR 60 | Temp 98.5°F | Ht 66.0 in | Wt 95.0 lb

## 2013-12-19 DIAGNOSIS — Z800 Family history of malignant neoplasm of digestive organs: Secondary | ICD-10-CM

## 2013-12-19 DIAGNOSIS — K921 Melena: Secondary | ICD-10-CM

## 2013-12-19 NOTE — Patient Instructions (Signed)
EGD with DR. Rehman 

## 2013-12-19 NOTE — Progress Notes (Signed)
Subjective:     Patient ID: [name] Andersen, female   DOB: 07/25/1950, 63 y.o.   MRN: [id number]  HPI Referred to our office by Dr. Juanetta GoslingHawkins for occult blood in stool. Her husband tells me his wife's stools were black in May. Stools were black x 4 BMs and then resolved. No fever associated with her symptoms.   10/23/2013 H and H 13.5 and 39.6, MCV 81.6, Platelet ct 153. Family hx of colon cancer in a mother in her 6160s Appetite is good. No weight loss. No acid reflux. No dysphagia.  No abdominal pain. Usually has a BM depending on digital exam. (Every 3 days), Stools are brown now. She does have some nausea. No NSAIDs. No Pepto Bismol.      01/22/2006 Colonoscopy: Dr. Karilyn Cotaehman: PROCEDURE: Colonoscopy.  INDICATIONS: [name] Andersen is s 63 year old Caucasian female who is undergoing high-  risk screening colonoscopy. Her mother had surgery for colon carcinoma at  age 61 or 3964. The patient has neurogenic bowel secondary to paraplegia.  FINAL DIAGNOSIS: Normal colonoscopy except external hemorrhoids.   Review of Systems Past Medical History  Diagnosis Date  . Quadriplegia 04/03/2013  . Spinal cord injury, C5-C7 04/03/2013  . Anxiety   . Insomnia     Past Surgical History  Procedure Laterality Date  . C5-c6 spinal cord fusion    . Colonoscopy  8.3.2007    Dr. Karilyn Cotaehman  . Tonsillectomy    . Partial hysterectomy    . Cholecystectomy      No Known Allergies  Current Outpatient Prescriptions on File Prior to Visit  Medication Sig Dispense Refill  . albuterol (PROVENTIL) (2.5 MG/3ML) 0.083% nebulizer solution Take 2.5 mg by nebulization 4 (four) times daily as needed for wheezing or shortness of breath.      . ALPRAZolam (XANAX XR) 0.5 MG 24 hr tablet Take 0.5 mg by mouth at bedtime. Take 1 or 2 tablets by mouth at bedtime for sleep      . baclofen (LIORESAL) 20 MG tablet Take 40 mg by mouth 4 (four) times daily.       . beclomethasone (QVAR) 40 MCG/ACT inhaler Inhale 2 puffs into the lungs 2 (two)  times daily.      . diazepam (VALIUM) 5 MG tablet Take 5 mg by mouth 2 (two) times daily.      Marland Kitchen. gabapentin (NEURONTIN) 600 MG tablet Take 600 mg by mouth 3 (three) times daily.      Marland Kitchen. levalbuterol (XOPENEX) 0.63 MG/3ML nebulizer solution Take 0.63 mg by nebulization.      . mupirocin ointment (BACTROBAN) 2 % Apply 1 application topically 2 (two) times daily.      . NUCYNTA 50 MG TABS tablet Take 50 mg by mouth 2 (two) times daily.       . temazepam (RESTORIL) 30 MG capsule Take 30 mg by mouth at bedtime as needed.       No current facility-administered medications on file prior to visit.        Objective:   Physical ExamPatient examined from wheelchair. (Quadraplegic) Filed Vitals:   12/19/13 1553  BP: 90/0  Pulse: 60  Temp: 98.5 F (36.9 C)  Height: 5\' 6"  (1.676 m)  Weight: 95 lb (43.092 kg)  Alert and oriented. Skin warm and dry. Oral mucosa is moist.   . Sclera anicteric, conjunctivae is pink. Thyroid not enlarged. No cervical lymphadenopathy. Lungs clear. Heart regular rate and rhythm.  Abdomen is soft. Bowel sounds are positive. No hepatomegaly. No abdominal  masses felt.  1+ edema to lower extremities.  .      Assessment:     Melena resolved at this time. PUD needs to be ruled out.  Family hx of colon cancer: Patient declined surveillance colonoscopy. She says she cannot tolerate the prep     Plan:     EGD with Dr. Karilyn Cotaehman

## 2013-12-26 ENCOUNTER — Telehealth (INDEPENDENT_AMBULATORY_CARE_PROVIDER_SITE_OTHER): Payer: Self-pay | Admitting: *Deleted

## 2013-12-26 NOTE — Telephone Encounter (Signed)
FYI: spoke to Ms [name] Andersen and she's really undecided about having procedure and said she would get back with once she decides to have EGD

## 2013-12-26 NOTE — Telephone Encounter (Signed)
noted 

## 2014-02-05 NOTE — Progress Notes (Signed)
Jonathen Rathman, OTR/L [phone]  

## 2014-02-09 ENCOUNTER — Encounter (INDEPENDENT_AMBULATORY_CARE_PROVIDER_SITE_OTHER): Payer: Self-pay

## 2014-03-10 ENCOUNTER — Emergency Department (HOSPITAL_COMMUNITY): Payer: BC Managed Care – PPO

## 2014-03-10 ENCOUNTER — Encounter (HOSPITAL_COMMUNITY): Payer: Self-pay | Admitting: Emergency Medicine

## 2014-03-10 ENCOUNTER — Inpatient Hospital Stay (HOSPITAL_COMMUNITY)
Admission: EM | Admit: 2014-03-10 | Discharge: 2014-03-12 | DRG: 177 | Disposition: A | Discharge status: Home / Self Care | Payer: BC Managed Care – PPO | Attending: Pulmonary Disease | Admitting: Pulmonary Disease

## 2014-03-10 DIAGNOSIS — J9600 Acute respiratory failure, unspecified whether with hypoxia or hypercapnia: Secondary | ICD-10-CM | POA: Diagnosis present

## 2014-03-10 DIAGNOSIS — IMO0002 Reserved for concepts with insufficient information to code with codable children: Secondary | ICD-10-CM

## 2014-03-10 DIAGNOSIS — F411 Generalized anxiety disorder: Secondary | ICD-10-CM | POA: Diagnosis present

## 2014-03-10 DIAGNOSIS — G8250 Quadriplegia, unspecified: Secondary | ICD-10-CM | POA: Diagnosis present

## 2014-03-10 DIAGNOSIS — J690 Pneumonitis due to inhalation of food and vomit: Principal | ICD-10-CM | POA: Diagnosis present

## 2014-03-10 DIAGNOSIS — Z66 Do not resuscitate: Secondary | ICD-10-CM | POA: Diagnosis present

## 2014-03-10 DIAGNOSIS — E876 Hypokalemia: Secondary | ICD-10-CM | POA: Diagnosis present

## 2014-03-10 DIAGNOSIS — S14105A Unspecified injury at C5 level of cervical spinal cord, initial encounter: Secondary | ICD-10-CM | POA: Diagnosis present

## 2014-03-10 DIAGNOSIS — R0602 Shortness of breath: Secondary | ICD-10-CM | POA: Diagnosis not present

## 2014-03-10 DIAGNOSIS — Z23 Encounter for immunization: Secondary | ICD-10-CM

## 2014-03-10 DIAGNOSIS — J9601 Acute respiratory failure with hypoxia: Secondary | ICD-10-CM

## 2014-03-10 LAB — COMPREHENSIVE METABOLIC PANEL
ALBUMIN: 3.3 g/dL — AB (ref 3.5–5.2)
ALK PHOS: 95 U/L (ref 39–117)
ALT: 16 U/L (ref 0–35)
AST: 21 U/L (ref 0–37)
Anion gap: 9 (ref 5–15)
BUN: 12 mg/dL (ref 6–23)
CHLORIDE: 99 meq/L (ref 96–112)
CO2: 33 mEq/L — ABNORMAL HIGH (ref 19–32)
Calcium: 8.9 mg/dL (ref 8.4–10.5)
Creatinine, Ser: 0.2 mg/dL — ABNORMAL LOW (ref 0.50–1.10)
GFR calc Af Amer: >90 mL/min (ref >90)
GFR calc non Af Amer: >90 mL/min (ref >90)
Glucose, Bld: 160 mg/dL — ABNORMAL HIGH (ref 70–99)
POTASSIUM: 3.6 meq/L — AB (ref 3.7–5.3)
Sodium: 141 mEq/L (ref 137–147)
Total Bilirubin: 0.4 mg/dL (ref 0.3–1.2)
Total Protein: 6.5 g/dL (ref 6.0–8.3)

## 2014-03-10 LAB — CBC WITH DIFFERENTIAL/PLATELET
BASOS ABS: 0 10*3/uL (ref 0.0–0.1)
BASOS PCT: 0 % (ref 0–1)
Eosinophils Absolute: 0.1 10*3/uL (ref 0.0–0.7)
Eosinophils Relative: 1 % (ref 0–5)
HCT: 41.2 % (ref 36.0–46.0)
Hemoglobin: 13.6 g/dL (ref 12.0–15.0)
Lymphocytes Relative: 9 % — ABNORMAL LOW (ref 12–46)
Lymphs Abs: 0.8 10*3/uL (ref 0.7–4.0)
MCH: 28 pg (ref 26.0–34.0)
MCHC: 33 g/dL (ref 30.0–36.0)
MCV: 84.8 fL (ref 78.0–100.0)
Monocytes Absolute: 0.2 10*3/uL (ref 0.1–1.0)
Monocytes Relative: 3 % (ref 3–12)
NEUTROS ABS: 7.4 10*3/uL (ref 1.7–7.7)
NEUTROS PCT: 87 % — AB (ref 43–77)
Platelets: 147 10*3/uL — ABNORMAL LOW (ref 150–400)
RBC: 4.86 MIL/uL (ref 3.87–5.11)
RDW: 13.9 % (ref 11.5–15.5)
WBC: 8.4 10*3/uL (ref 4.0–10.5)

## 2014-03-10 LAB — TROPONIN I
Troponin I: <0.3 ng/mL (ref <0.30)
Troponin I: <0.3 ng/mL (ref <0.30)
Troponin I: <0.3 ng/mL (ref <0.30)

## 2014-03-10 LAB — PRO B NATRIURETIC PEPTIDE: PRO B NATRI PEPTIDE: 114.4 pg/mL (ref 0–125)

## 2014-03-10 MED ORDER — INFLUENZA VAC SPLIT QUAD 0.5 ML IM SUSY
0.5000 mL | PREFILLED_SYRINGE | INTRAMUSCULAR | Status: AC
  Administered 2014-03-11: 0.5 mL via INTRAMUSCULAR
  Filled 2014-03-10: qty 0.5

## 2014-03-10 MED ORDER — ALPRAZOLAM ER 0.5 MG PO TB24: 0.5000 mg | ORAL_TABLET | Freq: Every day | ORAL | Status: DC

## 2014-03-10 MED ORDER — BACLOFEN 10 MG PO TABS
40.0000 mg | ORAL_TABLET | Freq: Four times a day (QID) | ORAL | Status: DC
Start: 2014-03-10 — End: 2014-03-12
  Administered 2014-03-10 – 2014-03-12 (×5): 40 mg via ORAL
  Filled 2014-03-10 (×6): qty 4

## 2014-03-10 MED ORDER — ACETAMINOPHEN 650 MG RE SUPP: 650.0000 mg | Freq: Four times a day (QID) | RECTAL | Status: DC | PRN

## 2014-03-10 MED ORDER — SODIUM CHLORIDE 0.9 % IJ SOLN
3.0000 mL | Freq: Two times a day (BID) | INTRAMUSCULAR | Status: DC
  Administered 2014-03-12: 3 mL via INTRAVENOUS

## 2014-03-10 MED ORDER — TEMAZEPAM 15 MG PO CAPS
30.0000 mg | ORAL_CAPSULE | Freq: Every evening | ORAL | Status: DC | PRN
  Administered 2014-03-10 – 2014-03-11 (×2): 30 mg via ORAL
  Filled 2014-03-10 (×2): qty 2

## 2014-03-10 MED ORDER — ALPRAZOLAM 0.5 MG PO TABS
0.5000 mg | ORAL_TABLET | Freq: Every day | ORAL | Status: DC
  Administered 2014-03-10 – 2014-03-12 (×2): 0.5 mg via ORAL
  Filled 2014-03-10 (×2): qty 1

## 2014-03-10 MED ORDER — IOHEXOL 350 MG/ML SOLN
100.0000 mL | Freq: Once | INTRAVENOUS | Status: AC | PRN
  Administered 2014-03-10: 100 mL via INTRAVENOUS

## 2014-03-10 MED ORDER — ACETAMINOPHEN 325 MG PO TABS: 650.0000 mg | ORAL_TABLET | Freq: Four times a day (QID) | ORAL | Status: DC | PRN

## 2014-03-10 MED ORDER — ONDANSETRON HCL 4 MG PO TABS: 4.0000 mg | ORAL_TABLET | Freq: Four times a day (QID) | ORAL | Status: DC | PRN

## 2014-03-10 MED ORDER — SUVOREXANT 20 MG PO TABS: 1.0000 | ORAL_TABLET | Freq: Every day | ORAL | Status: DC

## 2014-03-10 MED ORDER — LEVOFLOXACIN IN D5W 750 MG/150ML IV SOLN
750.0000 mg | Freq: Once | INTRAVENOUS | Status: AC
  Administered 2014-03-10: 750 mg via INTRAVENOUS
  Filled 2014-03-10: qty 150

## 2014-03-10 MED ORDER — ALBUTEROL SULFATE (2.5 MG/3ML) 0.083% IN NEBU: 2.5000 mg | INHALATION_SOLUTION | Freq: Four times a day (QID) | RESPIRATORY_TRACT | Status: DC | PRN

## 2014-03-10 MED ORDER — PIPERACILLIN-TAZOBACTAM 3.375 G IVPB
3.3750 g | Freq: Three times a day (TID) | INTRAVENOUS | Status: DC
  Administered 2014-03-10 – 2014-03-12 (×5): 3.375 g via INTRAVENOUS
  Filled 2014-03-10 (×9): qty 50

## 2014-03-10 MED ORDER — TAPENTADOL HCL 50 MG PO TABS: 50.0000 mg | ORAL_TABLET | Freq: Every day | ORAL | Status: DC | PRN

## 2014-03-10 MED ORDER — POTASSIUM CHLORIDE IN NACL 20-0.9 MEQ/L-% IV SOLN
INTRAVENOUS | Status: DC
Start: 2014-03-10 — End: 2014-03-12
  Administered 2014-03-11 (×2): via INTRAVENOUS

## 2014-03-10 MED ORDER — ZOLPIDEM TARTRATE 5 MG PO TABS
5.0000 mg | ORAL_TABLET | Freq: Every evening | ORAL | Status: DC | PRN
  Administered 2014-03-10 – 2014-03-11 (×2): 5 mg via ORAL
  Filled 2014-03-10 (×2): qty 1

## 2014-03-10 MED ORDER — GUAIFENESIN ER 600 MG PO TB12
600.0000 mg | ORAL_TABLET | Freq: Two times a day (BID) | ORAL | Status: DC
  Administered 2014-03-10 – 2014-03-12 (×4): 600 mg via ORAL
  Filled 2014-03-10 (×4): qty 1

## 2014-03-10 MED ORDER — GABAPENTIN 300 MG PO CAPS
600.0000 mg | ORAL_CAPSULE | Freq: Three times a day (TID) | ORAL | Status: DC
  Administered 2014-03-10 – 2014-03-12 (×5): 600 mg via ORAL
  Filled 2014-03-10 (×5): qty 2

## 2014-03-10 MED ORDER — GABAPENTIN 600 MG PO TABS
600.0000 mg | ORAL_TABLET | Freq: Three times a day (TID) | ORAL | Status: DC
  Filled 2014-03-10 (×6): qty 1

## 2014-03-10 MED ORDER — ONDANSETRON HCL 4 MG/2ML IJ SOLN: 4.0000 mg | Freq: Four times a day (QID) | INTRAMUSCULAR | Status: DC | PRN

## 2014-03-10 MED ORDER — ONDANSETRON HCL 4 MG/2ML IJ SOLN
INTRAMUSCULAR | Status: AC
  Administered 2014-03-10: 14:00:00 4 mg via INTRAVENOUS
  Filled 2014-03-10: qty 2

## 2014-03-10 MED ORDER — MUPIROCIN 2 % EX OINT
1.0000 "application " | TOPICAL_OINTMENT | Freq: Two times a day (BID) | CUTANEOUS | Status: DC
  Administered 2014-03-10 – 2014-03-12 (×2): 1 via TOPICAL
  Filled 2014-03-10: qty 22

## 2014-03-10 MED ORDER — DIAZEPAM 5 MG PO TABS: 5.0000 mg | ORAL_TABLET | Freq: Every day | ORAL | Status: DC | PRN

## 2014-03-10 MED ORDER — ONDANSETRON HCL 4 MG/2ML IJ SOLN
4.0000 mg | Freq: Once | INTRAMUSCULAR | Status: AC
  Administered 2014-03-10: 4 mg via INTRAVENOUS

## 2014-03-10 MED ORDER — ENOXAPARIN SODIUM 30 MG/0.3ML ~~LOC~~ SOLN
30.0000 mg | SUBCUTANEOUS | Status: DC
  Filled 2014-03-10: qty 0.3

## 2014-03-10 MED ORDER — ENOXAPARIN SODIUM 40 MG/0.4ML ~~LOC~~ SOLN: 40.0000 mg | SUBCUTANEOUS | Status: DC
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
## 2014-03-10 NOTE — Progress Notes (Addendum)
[name] Andersen does have some exp end wheezes. I questioned her about breathing neb tx and she stated they make her so nervous she prefers not to use them. She was started on flutter therapy and incentive, actually the incentive will be the best option for her as when she takes a deep breath she shortly after can cough, and move her secretions. If we give a Prn neb later it will be xopenex hopefully this will not cause as many jitters , atrovent may also be a long term possibility for her wheezes.
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
## 2014-03-10 NOTE — ED Notes (Signed)
Bernette Redbird (husband)  [phone]

## 2014-03-10 NOTE — Progress Notes (Signed)
ANTIBIOTIC CONSULT NOTE - INITIAL  Pharmacy Consult for Zosyn Indication: pneumonia  No Known Allergies  Patient Measurements: Height:  (162.6 cm) Weight: 83 lb 8.9 oz (37.9 kg) IBW/kg (Calculated) : 54.7  Vital Signs: Temp: 98.5 F (36.9 C) (09/19 1700) Temp src: Oral (09/19 1700) BP: 105/83 mmHg (09/19 1700) Pulse Rate: 83 (09/19 1700) Intake/Output from previous day:   Intake/Output from this shift:    Labs:  Recent Labs  03/10/14 1325  WBC 8.4  HGB 13.6  PLT 147*  CREATININE 0.20*   Estimated Creatinine Clearance: 44.2 ml/min (by C-G formula based on Cr of 0.2). No results found for this basename: VANCOTROUGH, VANCOPEAK, VANCORANDOM, GENTTROUGH, GENTPEAK, GENTRANDOM, TOBRATROUGH, TOBRAPEAK, TOBRARND, AMIKACINPEAK, AMIKACINTROU, AMIKACIN,  in the last 72 hours   Microbiology: No results found for this or any previous visit (from the past 720 hour(s)).  Medical History: Past Medical History  Diagnosis Date  . Quadriplegia 04/03/2013  . Spinal cord injury, C5-C7 04/03/2013  . Anxiety   . Insomnia     Medications:  Scheduled:  . ALPRAZolam  0.5 mg Oral QHS  . baclofen  40 mg Oral QID  . [START ON 03/11/2014] enoxaparin (LOVENOX) injection  30 mg Subcutaneous Q24H  . gabapentin  600 mg Oral TID  . guaiFENesin  600 mg Oral BID  . [START ON 03/11/2014] Influenza vac split quadrivalent PF  0.5 mL Intramuscular Tomorrow-1000  . mupirocin ointment  1 application Topical BID  . piperacillin-tazobactam (ZOSYN)  IV  3.375 g Intravenous Q8H  . sodium chloride  3 mL Intravenous Q12H   Assessment: 63 yo quadriplegic F admitted with shortness of breath and decreased O2 sats.  CXR consistent with aspiration PNA.   Patient is currently afebrile with normal WBC.   Renal function ok with CrCl >30ml/min.   Zosyn 9/19>>  Goal of Therapy:  Eradicate infection.  Plan:  Zosyn 3.375gm IV Q8h to be infused over 4hrs Monitor renal function and cx data   Elson Clan 03/10/2014,6:13 PM

## 2014-03-10 NOTE — ED Notes (Signed)
Patient has had recurrent PNAs in past and this morning has been having difficulty getting deep breath.  She checked her O2 which was in 70's.  Turned O2 up to 4L and administered neb treatment. O2 went up to low 90's.  Patient is quadraplegic.

## 2014-03-11 LAB — CBC
HCT: 36.5 % (ref 36.0–46.0)
Hemoglobin: 11.9 g/dL — ABNORMAL LOW (ref 12.0–15.0)
MCH: 28.1 pg (ref 26.0–34.0)
MCHC: 32.6 g/dL (ref 30.0–36.0)
MCV: 86.3 fL (ref 78.0–100.0)
PLATELETS: 143 10*3/uL — AB (ref 150–400)
RBC: 4.23 MIL/uL (ref 3.87–5.11)
RDW: 14.3 % (ref 11.5–15.5)
WBC: 9.1 10*3/uL (ref 4.0–10.5)

## 2014-03-11 LAB — BASIC METABOLIC PANEL
ANION GAP: 8 (ref 5–15)
BUN: 8 mg/dL (ref 6–23)
CO2: 32 mEq/L (ref 19–32)
CREATININE: 0.25 mg/dL — AB (ref 0.50–1.10)
Calcium: 8.6 mg/dL (ref 8.4–10.5)
Chloride: 101 mEq/L (ref 96–112)
Glucose, Bld: 91 mg/dL (ref 70–99)
Potassium: 4.1 mEq/L (ref 3.7–5.3)
Sodium: 141 mEq/L (ref 137–147)

## 2014-03-11 LAB — MAGNESIUM: MAGNESIUM: 1.8 mg/dL (ref 1.5–2.5)

## 2014-03-11 LAB — TROPONIN I: Troponin I: <0.3 ng/mL (ref <0.30)

## 2014-03-11 MED ORDER — CALCIUM CARBONATE ANTACID 500 MG PO CHEW
4.0000 | CHEWABLE_TABLET | ORAL | Status: DC | PRN
  Administered 2014-03-11: 800 mg via ORAL
  Filled 2014-03-11: qty 2

## 2014-03-11 MED ORDER — OXYCODONE HCL 5 MG PO TABS
10.0000 mg | ORAL_TABLET | Freq: Every day | ORAL | Status: DC | PRN
  Administered 2014-03-11: 10 mg via ORAL
  Filled 2014-03-11: qty 2

## 2014-03-11 NOTE — Progress Notes (Signed)
Subjective:  Patient was admitted yesterday due to desaturation and shortness of breath.her Ct scan of the chest showed aspiration pneumonia. Patient feels better now.  Objective: Vital signs in last 24 hours: Temp:  [97.8 F (36.6 C)-98.9 F (37.2 C)] 98.1 F (36.7 C) (09/20 0706) Pulse Rate:  [58-96] 69 (09/20 0706) Resp:  [18-20] 20 (09/20 0706) BP: (90-168)/(54-127) 90/54 mmHg (09/20 0706) SpO2:  [83 %-96 %] 95 % (09/20 0706) Weight:  [37.9 kg (83 lb 8.9 oz)-43.092 kg (95 lb)] 37.9 kg (83 lb 8.9 oz) (09/19 1700) Weight change:  Last BM Date: 03/10/14  Intake/Output from previous day:    PHYSICAL EXAM General appearance: alert and no distress Resp: diminished breath sounds bilaterally and rhonchi bilaterally Cardio: S1, S2 normal GI: soft, non-tender; bowel sounds normal; no masses,  no organomegaly Extremities: qudriplegic  Lab Results:  Results for orders placed during the hospital encounter of 03/10/14 (from the past 48 hour(s))  CBC WITH DIFFERENTIAL     Status: Abnormal   Collection Time    03/10/14  1:25 PM      Result Value Ref Range   WBC 8.4  4.0 - 10.5 K/uL   RBC 4.86  3.87 - 5.11 MIL/uL   Hemoglobin 13.6  12.0 - 15.0 g/dL   HCT 41.2  36.0 - 46.0 %   MCV 84.8  78.0 - 100.0 fL   MCH 28.0  26.0 - 34.0 pg   MCHC 33.0  30.0 - 36.0 g/dL   RDW 13.9  11.5 - 15.5 %   Platelets 147 (*) 150 - 400 K/uL   Neutrophils Relative % 87 (*) 43 - 77 %   Neutro Abs 7.4  1.7 - 7.7 K/uL   Lymphocytes Relative 9 (*) 12 - 46 %   Lymphs Abs 0.8  0.7 - 4.0 K/uL   Monocytes Relative 3  3 - 12 %   Monocytes Absolute 0.2  0.1 - 1.0 K/uL   Eosinophils Relative 1  0 - 5 %   Eosinophils Absolute 0.1  0.0 - 0.7 K/uL   Basophils Relative 0  0 - 1 %   Basophils Absolute 0.0  0.0 - 0.1 K/uL  COMPREHENSIVE METABOLIC PANEL     Status: Abnormal   Collection Time    03/10/14  1:25 PM      Result Value Ref Range   Sodium 141  137 - 147 mEq/L   Potassium 3.6 (*) 3.7 - 5.3 mEq/L    Chloride 99  96 - 112 mEq/L   CO2 33 (*) 19 - 32 mEq/L   Glucose, Bld 160 (*) 70 - 99 mg/dL   BUN 12  6 - 23 mg/dL   Creatinine, Ser 0.20 (*) 0.50 - 1.10 mg/dL   Calcium 8.9  8.4 - 10.5 mg/dL   Total Protein 6.5  6.0 - 8.3 g/dL   Albumin 3.3 (*) 3.5 - 5.2 g/dL   AST 21  0 - 37 U/L   ALT 16  0 - 35 U/L   Alkaline Phosphatase 95  39 - 117 U/L   Total Bilirubin 0.4  0.3 - 1.2 mg/dL   GFR calc non Af Amer >90  >90 mL/min   GFR calc Af Amer >90  >90 mL/min   Comment: (NOTE)     The eGFR has been calculated using the CKD EPI equation.     This calculation has not been validated in all clinical situations.     eGFR's persistently <90 mL/min signify possible Chronic  Kidney     Disease.   Anion gap 9  5 - 15  PRO B NATRIURETIC PEPTIDE     Status: None   Collection Time    03/10/14  1:25 PM      Result Value Ref Range   Pro B Natriuretic peptide (BNP) 114.4  0 - 125 pg/mL  TROPONIN I     Status: None   Collection Time    03/10/14  1:25 PM      Result Value Ref Range   Troponin I <0.30  <0.30 ng/mL   Comment:            Due to the release kinetics of cTnI,     a negative result within the first hours     of the onset of symptoms does not rule out     myocardial infarction with certainty.     If myocardial infarction is still suspected,     repeat the test at appropriate intervals.  TROPONIN I     Status: None   Collection Time    03/10/14  5:25 PM      Result Value Ref Range   Troponin I <0.30  <0.30 ng/mL   Comment:            Due to the release kinetics of cTnI,     a negative result within the first hours     of the onset of symptoms does not rule out     myocardial infarction with certainty.     If myocardial infarction is still suspected,     repeat the test at appropriate intervals.  TROPONIN I     Status: None   Collection Time    03/10/14 10:54 PM      Result Value Ref Range   Troponin I <0.30  <0.30 ng/mL   Comment:            Due to the release kinetics of cTnI,      a negative result within the first hours     of the onset of symptoms does not rule out     myocardial infarction with certainty.     If myocardial infarction is still suspected,     repeat the test at appropriate intervals.  BASIC METABOLIC PANEL     Status: Abnormal   Collection Time    03/11/14  5:30 AM      Result Value Ref Range   Sodium 141  137 - 147 mEq/L   Potassium 4.1  3.7 - 5.3 mEq/L   Chloride 101  96 - 112 mEq/L   CO2 32  19 - 32 mEq/L   Glucose, Bld 91  70 - 99 mg/dL   BUN 8  6 - 23 mg/dL   Creatinine, Ser 0.25 (*) 0.50 - 1.10 mg/dL   Calcium 8.6  8.4 - 10.5 mg/dL   GFR calc non Af Amer >90  >90 mL/min   GFR calc Af Amer >90  >90 mL/min   Comment: (NOTE)     The eGFR has been calculated using the CKD EPI equation.     This calculation has not been validated in all clinical situations.     eGFR's persistently <90 mL/min signify possible Chronic Kidney     Disease.   Anion gap 8  5 - 15  CBC     Status: Abnormal   Collection Time    03/11/14  5:30 AM      Result Value Ref Range  WBC 9.1  4.0 - 10.5 K/uL   RBC 4.23  3.87 - 5.11 MIL/uL   Hemoglobin 11.9 (*) 12.0 - 15.0 g/dL   HCT 36.5  36.0 - 46.0 %   MCV 86.3  78.0 - 100.0 fL   MCH 28.1  26.0 - 34.0 pg   MCHC 32.6  30.0 - 36.0 g/dL   RDW 14.3  11.5 - 15.5 %   Platelets 143 (*) 150 - 400 K/uL  MAGNESIUM     Status: None   Collection Time    03/11/14  5:30 AM      Result Value Ref Range   Magnesium 1.8  1.5 - 2.5 mg/dL  TROPONIN I     Status: None   Collection Time    03/11/14  5:30 AM      Result Value Ref Range   Troponin I <0.30  <0.30 ng/mL   Comment:            Due to the release kinetics of cTnI,     a negative result within the first hours     of the onset of symptoms does not rule out     myocardial infarction with certainty.     If myocardial infarction is still suspected,     repeat the test at appropriate intervals.    ABGS No results found for this basename: PHART, PCO2, PO2ART,  TCO2, HCO3,  in the last 72 hours CULTURES No results found for this or any previous visit (from the past 240 hour(s)). Studies/Results: Ct Angio Chest Pe W/cm &/or Wo Cm  03/10/2014   CLINICAL DATA:  Shortness of breath.  Elevated D-dimer.  EXAM: CT ANGIOGRAPHY CHEST WITH CONTRAST  TECHNIQUE: Multidetector CT imaging of the chest was performed using the standard protocol during bolus administration of intravenous contrast. Multiplanar CT image reconstructions and MIPs were obtained to evaluate the vascular anatomy.  CONTRAST:  141m OMNIPAQUE IOHEXOL 350 MG/ML SOLN  COMPARISON:  Chest CT 05/28/2004.  FINDINGS: Adequate opacification of the main pulmonary artery. No evidence for pulmonary embolism. There is limited evaluation of the distal segmental and subsegmental pulmonary arteries within the left lower lobe secondary to motion artifact.  No enlarged axillary, mediastinal or hilar lymphadenopathy. Normal heart size. No pericardial effusion. Aorta and main pulmonary artery are normal in caliber.  There is layering dependent fluid within the trachea extending into left mainstem and left lower and upper lobe bronchi. There is irregular consolidative opacity in a peribronchial distribution within the left lower lobe extending to pleura. Additional consolidative opacity located along the left fissure within the left upper lobe. Bilateral subpleural reticular opacities. Lingular scarring. Stable 3 mm right lower lobe nodule (image 65; series 5).  Visualization of the upper abdomen demonstrates postcholecystectomy changes. Heterogeneous enhancement of the spleen. No aggressive or acute appearing osseous lesions.  Review of the MIP images confirms the above findings.  IMPRESSION: Limited evaluation of the distal segmental and subsegmental pulmonary arteries within the left lower lobe secondary to motion artifact. Within the above limitation, no evidence for pulmonary embolism.  Material is demonstrated layering  dependently within the trachea and extending into the left lower and left upper lobe bronchi most compatible with aspiration. There is associated irregular consolidation within the left lower lobe as well as consolidative opacity within the left upper lobe along the fissure likely representing aspiration pneumonitis or acute infectious process. Given the overall findings, a follow-up chest CT in 3 months is recommended to evaluate for interval improvement  after resolution of the acute process.   Electronically Signed   By: Lovey Newcomer M.D.   On: 03/10/2014 15:33   Dg Chest Portable 1 View  03/10/2014   CLINICAL DATA:  Short of breath.  EXAM: PORTABLE CHEST - 1 VIEW  COMPARISON:  04/17/2013  FINDINGS: Cardiac silhouette is normal size. Mediastinal or hilar masses. Stable upper lobe lung scarring. There are more diffuse prominent interstitial markings, also stable. No areas of lung consolidation. No pulmonary edema. No pleural effusion or pneumothorax.  Bony thorax is demineralized but grossly intact.  IMPRESSION: No acute cardiopulmonary disease.   Electronically Signed   By: Lajean Manes M.D.   On: 03/10/2014 14:03    Medications: I have reviewed the patient's current medications.  Assesment: Principal Problem:   Aspiration pneumonia Active Problems:   Quadriplegia   Spinal cord injury, C5-C7   Acute respiratory failure   Hypokalemia    Plan: Medications reviewed Will continue Iv antibiotics. Continue regular treatment Supportive care     LOS: 1 day   Shawne Eskelson 03/11/2014, 9:50 AM

## 2014-03-11 NOTE — Progress Notes (Signed)
Utilization review Completed Carlyon Nolasco RN BSN   

## 2014-03-12 ENCOUNTER — Inpatient Hospital Stay (HOSPITAL_COMMUNITY): Payer: BC Managed Care – PPO

## 2014-03-12 MED ORDER — LEVALBUTEROL HCL 0.63 MG/3ML IN NEBU: 0.6300 mg | INHALATION_SOLUTION | Freq: Four times a day (QID) | RESPIRATORY_TRACT | Status: DC | PRN

## 2014-03-12 MED ORDER — AMOXICILLIN-POT CLAVULANATE 500-125 MG PO TABS: 1.0000 | ORAL_TABLET | Freq: Three times a day (TID) | ORAL | Status: DC

## 2014-03-12 MED ORDER — IOHEXOL 350 MG/ML SOLN: 100.0000 mL | Freq: Once | INTRAVENOUS | Status: DC | PRN

## 2014-03-12 NOTE — Progress Notes (Addendum)
Called to room to assist in mobilizing secretions , help with quad cough and incentive and flutter valve.  PT neb changed from albuterol to xopenex , albuterol makes her so nervous etc, xopenex is prn only,-- pt still reluctant to take.

## 2014-03-12 NOTE — Care Management Note (Signed)
    Page 1 of 1   03/12/2014     12:04:28 PM CARE MANAGEMENT NOTE 03/12/2014  Patient:  [name] Andersen, [name] Andersen   Account Number:  [id number]  Date Initiated:  03/12/2014  Documentation initiated by:  Sharrie Rothman  Subjective/Objective Assessment:   Pt admitted from home with pneumonia. Pt lives with her husband and will return home at discharge. Pt is a quadraplegic and has all DME needs in the home. Pts husband provides around the clock care for the pt in the home.     Action/Plan:   Pt discharged home today. No CM needs noted.   Anticipated DC Date:  03/12/2014   Anticipated DC Plan:  HOME/SELF CARE      DC Planning Services  CM consult      Choice offered to / List presented to:             Status of service:  Completed, signed off Medicare Important Message given?  YES (If response is "NO", the following Medicare IM given date fields will be blank) Date Medicare IM given:  03/12/2014 Medicare IM given by:  Sharrie Rothman Date Additional Medicare IM given:   Additional Medicare IM given by:    Discharge Disposition:  HOME/SELF CARE  Per UR Regulation:    If discussed at Long Length of Stay Meetings, dates discussed:    Comments:  03/12/14 1200 Arlyss Queen, RN BSN CM

## 2014-03-12 NOTE — Progress Notes (Signed)
Subjective:  Patient is resting. She had cough during the night. Her breathing is improving. No fever or chills. No chest pain. She is receiving iv antibiotics.  Objective: Vital signs in last 24 hours: Temp:  [98.1 F (36.7 C)-98.4 F (36.9 C)] 98.1 F (36.7 C) (09/21 0717) Pulse Rate:  [61-72] 72 (09/21 0717) Resp:  [20] 20 (09/21 0717) BP: (98-116)/(58-76) 100/66 mmHg (09/21 0717) SpO2:  [93 %-97 %] 93 % (09/21 0717) Weight change:  Last BM Date: 03/08/14  Intake/Output from previous day: 09/20 0701 - 09/21 0700 In: 1428.8 [P.O.:480; I.V.:898.8; IV Piggyback:50] Out: 3700 [Urine:3700]  PHYSICAL EXAM General appearance: alert and no distress Resp: diminished breath sounds bilaterally and rhonchi bilaterally Cardio: S1, S2 normal GI: soft, non-tender; bowel sounds normal; no masses,  no organomegaly Extremities: qudriplegic  Lab Results:  Results for orders placed during the hospital encounter of 03/10/14 (from the past 48 hour(s))  CBC WITH DIFFERENTIAL     Status: Abnormal   Collection Time    03/10/14  1:25 PM      Result Value Ref Range   WBC 8.4  4.0 - 10.5 K/uL   RBC 4.86  3.87 - 5.11 MIL/uL   Hemoglobin 13.6  12.0 - 15.0 g/dL   HCT 41.2  36.0 - 46.0 %   MCV 84.8  78.0 - 100.0 fL   MCH 28.0  26.0 - 34.0 pg   MCHC 33.0  30.0 - 36.0 g/dL   RDW 13.9  11.5 - 15.5 %   Platelets 147 (*) 150 - 400 K/uL   Neutrophils Relative % 87 (*) 43 - 77 %   Neutro Abs 7.4  1.7 - 7.7 K/uL   Lymphocytes Relative 9 (*) 12 - 46 %   Lymphs Abs 0.8  0.7 - 4.0 K/uL   Monocytes Relative 3  3 - 12 %   Monocytes Absolute 0.2  0.1 - 1.0 K/uL   Eosinophils Relative 1  0 - 5 %   Eosinophils Absolute 0.1  0.0 - 0.7 K/uL   Basophils Relative 0  0 - 1 %   Basophils Absolute 0.0  0.0 - 0.1 K/uL  COMPREHENSIVE METABOLIC PANEL     Status: Abnormal   Collection Time    03/10/14  1:25 PM      Result Value Ref Range   Sodium 141  137 - 147 mEq/L   Potassium 3.6 (*) 3.7 - 5.3 mEq/L    Chloride 99  96 - 112 mEq/L   CO2 33 (*) 19 - 32 mEq/L   Glucose, Bld 160 (*) 70 - 99 mg/dL   BUN 12  6 - 23 mg/dL   Creatinine, Ser 0.20 (*) 0.50 - 1.10 mg/dL   Calcium 8.9  8.4 - 10.5 mg/dL   Total Protein 6.5  6.0 - 8.3 g/dL   Albumin 3.3 (*) 3.5 - 5.2 g/dL   AST 21  0 - 37 U/L   ALT 16  0 - 35 U/L   Alkaline Phosphatase 95  39 - 117 U/L   Total Bilirubin 0.4  0.3 - 1.2 mg/dL   GFR calc non Af Amer >90  >90 mL/min   GFR calc Af Amer >90  >90 mL/min   Comment: (NOTE)     The eGFR has been calculated using the CKD EPI equation.     This calculation has not been validated in all clinical situations.     eGFR's persistently <90 mL/min signify possible Chronic Kidney  Disease.   Anion gap 9  5 - 15  PRO B NATRIURETIC PEPTIDE     Status: None   Collection Time    03/10/14  1:25 PM      Result Value Ref Range   Pro B Natriuretic peptide (BNP) 114.4  0 - 125 pg/mL  TROPONIN I     Status: None   Collection Time    03/10/14  1:25 PM      Result Value Ref Range   Troponin I <0.30  <0.30 ng/mL   Comment:            Due to the release kinetics of cTnI,     a negative result within the first hours     of the onset of symptoms does not rule out     myocardial infarction with certainty.     If myocardial infarction is still suspected,     repeat the test at appropriate intervals.  TROPONIN I     Status: None   Collection Time    03/10/14  5:25 PM      Result Value Ref Range   Troponin I <0.30  <0.30 ng/mL   Comment:            Due to the release kinetics of cTnI,     a negative result within the first hours     of the onset of symptoms does not rule out     myocardial infarction with certainty.     If myocardial infarction is still suspected,     repeat the test at appropriate intervals.  TROPONIN I     Status: None   Collection Time    03/10/14 10:54 PM      Result Value Ref Range   Troponin I <0.30  <0.30 ng/mL   Comment:            Due to the release kinetics of cTnI,      a negative result within the first hours     of the onset of symptoms does not rule out     myocardial infarction with certainty.     If myocardial infarction is still suspected,     repeat the test at appropriate intervals.  BASIC METABOLIC PANEL     Status: Abnormal   Collection Time    03/11/14  5:30 AM      Result Value Ref Range   Sodium 141  137 - 147 mEq/L   Potassium 4.1  3.7 - 5.3 mEq/L   Chloride 101  96 - 112 mEq/L   CO2 32  19 - 32 mEq/L   Glucose, Bld 91  70 - 99 mg/dL   BUN 8  6 - 23 mg/dL   Creatinine, Ser 0.25 (*) 0.50 - 1.10 mg/dL   Calcium 8.6  8.4 - 10.5 mg/dL   GFR calc non Af Amer >90  >90 mL/min   GFR calc Af Amer >90  >90 mL/min   Comment: (NOTE)     The eGFR has been calculated using the CKD EPI equation.     This calculation has not been validated in all clinical situations.     eGFR's persistently <90 mL/min signify possible Chronic Kidney     Disease.   Anion gap 8  5 - 15  CBC     Status: Abnormal   Collection Time    03/11/14  5:30 AM      Result Value Ref Range   WBC 9.1  4.0 - 10.5 K/uL   RBC 4.23  3.87 - 5.11 MIL/uL   Hemoglobin 11.9 (*) 12.0 - 15.0 g/dL   HCT 36.5  36.0 - 46.0 %   MCV 86.3  78.0 - 100.0 fL   MCH 28.1  26.0 - 34.0 pg   MCHC 32.6  30.0 - 36.0 g/dL   RDW 14.3  11.5 - 15.5 %   Platelets 143 (*) 150 - 400 K/uL  MAGNESIUM     Status: None   Collection Time    03/11/14  5:30 AM      Result Value Ref Range   Magnesium 1.8  1.5 - 2.5 mg/dL  TROPONIN I     Status: None   Collection Time    03/11/14  5:30 AM      Result Value Ref Range   Troponin I <0.30  <0.30 ng/mL   Comment:            Due to the release kinetics of cTnI,     a negative result within the first hours     of the onset of symptoms does not rule out     myocardial infarction with certainty.     If myocardial infarction is still suspected,     repeat the test at appropriate intervals.    ABGS No results found for this basename: PHART, PCO2, PO2ART,  TCO2, HCO3,  in the last 72 hours CULTURES No results found for this or any previous visit (from the past 240 hour(s)). Studies/Results: Ct Angio Chest Pe W/cm &/or Wo Cm  03/11/2014   ADDENDUM REPORT: 03/11/2014 10:33  ADDENDUM: Addendum correction of a recommendation:  Recommendation should read as follows:  Followup chest CT in 4-6 weeks is recommended to evaluate for interval improvement/resolution.  These results will be called to the ordering clinician or representative by the Radiologist Assistant, and communication documented in the PACS or zVision Dashboard.   Electronically Signed   By: Lovey Newcomer M.D.   On: 03/11/2014 10:33   03/11/2014   CLINICAL DATA:  Shortness of breath.  Elevated D-dimer.  EXAM: CT ANGIOGRAPHY CHEST WITH CONTRAST  TECHNIQUE: Multidetector CT imaging of the chest was performed using the standard protocol during bolus administration of intravenous contrast. Multiplanar CT image reconstructions and MIPs were obtained to evaluate the vascular anatomy.  CONTRAST:  141mL OMNIPAQUE IOHEXOL 350 MG/ML SOLN  COMPARISON:  Chest CT 05/28/2004.  FINDINGS: Adequate opacification of the main pulmonary artery. No evidence for pulmonary embolism. There is limited evaluation of the distal segmental and subsegmental pulmonary arteries within the left lower lobe secondary to motion artifact.  No enlarged axillary, mediastinal or hilar lymphadenopathy. Normal heart size. No pericardial effusion. Aorta and main pulmonary artery are normal in caliber.  There is layering dependent fluid within the trachea extending into left mainstem and left lower and upper lobe bronchi. There is irregular consolidative opacity in a peribronchial distribution within the left lower lobe extending to pleura. Additional consolidative opacity located along the left fissure within the left upper lobe. Bilateral subpleural reticular opacities. Lingular scarring. Stable 3 mm right lower lobe nodule (image 65; series 5).   Visualization of the upper abdomen demonstrates postcholecystectomy changes. Heterogeneous enhancement of the spleen. No aggressive or acute appearing osseous lesions.  Review of the MIP images confirms the above findings.  IMPRESSION: Limited evaluation of the distal segmental and subsegmental pulmonary arteries within the left lower lobe secondary to motion artifact. Within the above limitation, no evidence for pulmonary  embolism.  Material is demonstrated layering dependently within the trachea and extending into the left lower and left upper lobe bronchi most compatible with aspiration. There is associated irregular consolidation within the left lower lobe as well as consolidative opacity within the left upper lobe along the fissure likely representing aspiration pneumonitis or acute infectious process. Given the overall findings, a follow-up chest CT in 3 months is recommended to evaluate for interval improvement after resolution of the acute process.  Electronically Signed: By: Lovey Newcomer M.D. On: 03/10/2014 15:33   Dg Chest Portable 1 View  03/10/2014   CLINICAL DATA:  Short of breath.  EXAM: PORTABLE CHEST - 1 VIEW  COMPARISON:  04/17/2013  FINDINGS: Cardiac silhouette is normal size. Mediastinal or hilar masses. Stable upper lobe lung scarring. There are more diffuse prominent interstitial markings, also stable. No areas of lung consolidation. No pulmonary edema. No pleural effusion or pneumothorax.  Bony thorax is demineralized but grossly intact.  IMPRESSION: No acute cardiopulmonary disease.   Electronically Signed   By: Lajean Manes M.D.   On: 03/10/2014 14:03    Medications: I have reviewed the patient's current medications.  Assesment: Principal Problem:   Aspiration pneumonia Active Problems:   Quadriplegia   Spinal cord injury, C5-C7   Acute respiratory failure   Hypokalemia    Plan: Medications reviewed Will continue Iv antibiotics. Continue regular treatment Supportive  care     LOS: 2 days   Quanesha Klimaszewski 03/12/2014, 7:40 AM

## 2014-03-23 NOTE — Discharge Summary (Signed)
Physician Discharge Summary  Patient ID: [name] Andersen MRN: [id number] DOB/AGE: Sep 19, 1950 63 y.o. Primary Care Physician:HAWKINS,EDWARD L, MD Admit date: 03/10/2014 Discharge date: 03/12/14    Discharge Diagnoses:   Principal Problem:   Aspiration pneumonia Active Problems:   Quadriplegia   Spinal cord injury, C5-C7   Acute respiratory failure   Hypokalemia     Medication List         albuterol (2.5 MG/3ML) 0.083% nebulizer solution  Commonly known as:  PROVENTIL  Take 2.5 mg by nebulization 4 (four) times daily as needed for wheezing or shortness of breath.     ALPRAZolam 0.5 MG 24 hr tablet  Commonly known as:  XANAX XR  Take 0.5 mg by mouth at bedtime. Take 1 or 2 tablets by mouth at bedtime for sleep     amoxicillin-clavulanate 500-125 MG per tablet  Commonly known as:  AUGMENTIN  Take 1 tablet (500 mg total) by mouth 3 (three) times daily.     baclofen 20 MG tablet  Commonly known as:  LIORESAL  Take 40 mg by mouth 4 (four) times daily.     BELSOMRA 20 MG Tabs  Generic drug:  Suvorexant  Take 1 tablet by mouth daily.     diazepam 5 MG tablet  Commonly known as:  VALIUM  Take 5 mg by mouth daily as needed for anxiety.     gabapentin 600 MG tablet  Commonly known as:  NEURONTIN  Take 600 mg by mouth 3 (three) times daily.     levalbuterol 0.63 MG/3ML nebulizer solution  Commonly known as:  XOPENEX  Take 0.63 mg by nebulization.     multivitamin with minerals Tabs tablet  Take 1 tablet by mouth daily.     mupirocin ointment 2 %  Commonly known as:  BACTROBAN  Apply 1 application topically 2 (two) times daily.     NUCYNTA 50 MG Tabs tablet  Generic drug:  tapentadol  Take 50 mg by mouth daily as needed for severe pain.     temazepam 30 MG capsule  Commonly known as:  RESTORIL  Take 30 mg by mouth at bedtime as needed.        Discharged Condition: improved    Consults: none  Significant Diagnostic Studies: Dg Chest 1 View  03/12/2014    CLINICAL DATA:  Pneumonia.  EXAM: CHEST - 1 VIEW  COMPARISON:  March 10, 2014.  FINDINGS: The heart size and mediastinal contours are within normal limits. Stable scarring of left upper lobe. No acute pulmonary disease is noted. No pneumothorax or pleural effusion is noted. The visualized skeletal structures are unremarkable.  IMPRESSION: No acute cardiopulmonary abnormality seen.   Electronically Signed   By: Roque Lias M.D.   On: 03/12/2014 12:05   Ct Angio Chest Pe W/cm &/or Wo Cm  03/11/2014   ADDENDUM REPORT: 03/11/2014 10:33  ADDENDUM: Addendum correction of a recommendation:  Recommendation should read as follows:  Followup chest CT in 4-6 weeks is recommended to evaluate for interval improvement/resolution.  These results will be called to the ordering clinician or representative by the Radiologist Assistant, and communication documented in the PACS or zVision Dashboard.   Electronically Signed   By: Annia Belt M.D.   On: 03/11/2014 10:33   03/11/2014   CLINICAL DATA:  Shortness of breath.  Elevated D-dimer.  EXAM: CT ANGIOGRAPHY CHEST WITH CONTRAST  TECHNIQUE: Multidetector CT imaging of the chest was performed using the standard protocol during bolus administration of intravenous contrast.  Multiplanar CT image reconstructions and MIPs were obtained to evaluate the vascular anatomy.  CONTRAST:  100mL OMNIPAQUE IOHEXOL 350 MG/ML SOLN  COMPARISON:  Chest CT 05/28/2004.  FINDINGS: Adequate opacification of the main pulmonary artery. No evidence for pulmonary embolism. There is limited evaluation of the distal segmental and subsegmental pulmonary arteries within the left lower lobe secondary to motion artifact.  No enlarged axillary, mediastinal or hilar lymphadenopathy. Normal heart size. No pericardial effusion. Aorta and main pulmonary artery are normal in caliber.  There is layering dependent fluid within the trachea extending into left mainstem and left lower and upper lobe bronchi. There is  irregular consolidative opacity in a peribronchial distribution within the left lower lobe extending to pleura. Additional consolidative opacity located along the left fissure within the left upper lobe. Bilateral subpleural reticular opacities. Lingular scarring. Stable 3 mm right lower lobe nodule (image 65; series 5).  Visualization of the upper abdomen demonstrates postcholecystectomy changes. Heterogeneous enhancement of the spleen. No aggressive or acute appearing osseous lesions.  Review of the MIP images confirms the above findings.  IMPRESSION: Limited evaluation of the distal segmental and subsegmental pulmonary arteries within the left lower lobe secondary to motion artifact. Within the above limitation, no evidence for pulmonary embolism.  Material is demonstrated layering dependently within the trachea and extending into the left lower and left upper lobe bronchi most compatible with aspiration. There is associated irregular consolidation within the left lower lobe as well as consolidative opacity within the left upper lobe along the fissure likely representing aspiration pneumonitis or acute infectious process. Given the overall findings, a follow-up chest CT in 3 months is recommended to evaluate for interval improvement after resolution of the acute process.  Electronically Signed: By: Annia Beltrew  Davis M.D. On: 03/10/2014 15:33   Dg Chest Portable 1 View  03/10/2014   CLINICAL DATA:  Short of breath.  EXAM: PORTABLE CHEST - 1 VIEW  COMPARISON:  04/17/2013  FINDINGS: Cardiac silhouette is normal size. Mediastinal or hilar masses. Stable upper lobe lung scarring. There are more diffuse prominent interstitial markings, also stable. No areas of lung consolidation. No pulmonary edema. No pleural effusion or pneumothorax.  Bony thorax is demineralized but grossly intact.  IMPRESSION: No acute cardiopulmonary disease.   Electronically Signed   By: Amie Portlandavid  Ormond M.D.   On: 03/10/2014 14:03    Lab  Results: Basic Metabolic Panel: No results found for this basename: NA, K, CL, CO2, GLUCOSE, BUN, CREATININE, CALCIUM, MG, PHOS,  in the last 72 hours Liver Function Tests: No results found for this basename: AST, ALT, ALKPHOS, BILITOT, PROT, ALBUMIN,  in the last 72 hours   CBC: No results found for this basename: WBC, NEUTROABS, HGB, HCT, MCV, PLT,  in the last 72 hours  No results found for this or any previous visit (from the past 240 hour(s)).   Hospital Course:   This is a 63 years old female patient with history of multiple medical illnesses including quadriplegia was admitted and was stated on combination IV ant biotics.  Patient improved slightly. However, patient insisted to be discharge within 36 hours of admission. She was discharged on oral antibiotics to be followed by DR Juanetta GoslingHawkins her primary care physician. Discharge Exam: Blood pressure 100/66, pulse 72, temperature 98.1 F (36.7 C), temperature source Oral, resp. rate 20, height 5\' 4"  (1.626 m), weight 37.9 kg (83 lb 8.9 oz), SpO2 93.00%.   Disposition:  Home.      Signed: Kalob Bergen   03/23/2014, 8:07  AM

## 2014-07-16 ENCOUNTER — Ambulatory Visit: Payer: Self-pay | Admitting: Women's Health

## 2014-07-24 ENCOUNTER — Emergency Department (HOSPITAL_COMMUNITY): Payer: BLUE CROSS/BLUE SHIELD

## 2014-07-24 ENCOUNTER — Inpatient Hospital Stay (HOSPITAL_COMMUNITY): Payer: BLUE CROSS/BLUE SHIELD

## 2014-07-24 ENCOUNTER — Encounter (HOSPITAL_COMMUNITY): Payer: Self-pay | Admitting: *Deleted

## 2014-07-24 ENCOUNTER — Inpatient Hospital Stay (HOSPITAL_COMMUNITY)
Admission: EM | Admit: 2014-07-24 | Discharge: 2014-08-07 | DRG: 207 | Disposition: A | Discharge status: Home Health | Payer: BLUE CROSS/BLUE SHIELD | Attending: Internal Medicine | Admitting: Internal Medicine

## 2014-07-24 DIAGNOSIS — T17990A Other foreign object in respiratory tract, part unspecified in causing asphyxiation, initial encounter: Secondary | ICD-10-CM | POA: Diagnosis present

## 2014-07-24 DIAGNOSIS — J9602 Acute respiratory failure with hypercapnia: Secondary | ICD-10-CM | POA: Diagnosis present

## 2014-07-24 DIAGNOSIS — Z4659 Encounter for fitting and adjustment of other gastrointestinal appliance and device: Secondary | ICD-10-CM

## 2014-07-24 DIAGNOSIS — I959 Hypotension, unspecified: Secondary | ICD-10-CM | POA: Diagnosis present

## 2014-07-24 DIAGNOSIS — N179 Acute kidney failure, unspecified: Secondary | ICD-10-CM | POA: Diagnosis not present

## 2014-07-24 DIAGNOSIS — M62838 Other muscle spasm: Secondary | ICD-10-CM | POA: Diagnosis present

## 2014-07-24 DIAGNOSIS — K5900 Constipation, unspecified: Secondary | ICD-10-CM | POA: Diagnosis present

## 2014-07-24 DIAGNOSIS — F419 Anxiety disorder, unspecified: Secondary | ICD-10-CM | POA: Diagnosis present

## 2014-07-24 DIAGNOSIS — J9811 Atelectasis: Secondary | ICD-10-CM | POA: Insufficient documentation

## 2014-07-24 DIAGNOSIS — Z79899 Other long term (current) drug therapy: Secondary | ICD-10-CM | POA: Diagnosis not present

## 2014-07-24 DIAGNOSIS — E43 Unspecified severe protein-calorie malnutrition: Secondary | ICD-10-CM | POA: Diagnosis present

## 2014-07-24 DIAGNOSIS — D509 Iron deficiency anemia, unspecified: Secondary | ICD-10-CM | POA: Diagnosis present

## 2014-07-24 DIAGNOSIS — J189 Pneumonia, unspecified organism: Secondary | ICD-10-CM | POA: Diagnosis present

## 2014-07-24 DIAGNOSIS — Z01818 Encounter for other preprocedural examination: Secondary | ICD-10-CM

## 2014-07-24 DIAGNOSIS — Z9981 Dependence on supplemental oxygen: Secondary | ICD-10-CM

## 2014-07-24 DIAGNOSIS — Z978 Presence of other specified devices: Secondary | ICD-10-CM

## 2014-07-24 DIAGNOSIS — G904 Autonomic dysreflexia: Secondary | ICD-10-CM | POA: Diagnosis present

## 2014-07-24 DIAGNOSIS — J811 Chronic pulmonary edema: Secondary | ICD-10-CM | POA: Diagnosis present

## 2014-07-24 DIAGNOSIS — R05 Cough: Secondary | ICD-10-CM | POA: Diagnosis present

## 2014-07-24 DIAGNOSIS — R0602 Shortness of breath: Secondary | ICD-10-CM

## 2014-07-24 DIAGNOSIS — Z66 Do not resuscitate: Secondary | ICD-10-CM | POA: Diagnosis present

## 2014-07-24 DIAGNOSIS — E872 Acidosis: Secondary | ICD-10-CM | POA: Diagnosis present

## 2014-07-24 DIAGNOSIS — Z681 Body mass index (BMI) 19 or less, adult: Secondary | ICD-10-CM | POA: Diagnosis not present

## 2014-07-24 DIAGNOSIS — G8250 Quadriplegia, unspecified: Secondary | ICD-10-CM | POA: Diagnosis present

## 2014-07-24 DIAGNOSIS — E876 Hypokalemia: Secondary | ICD-10-CM | POA: Diagnosis present

## 2014-07-24 DIAGNOSIS — R0600 Dyspnea, unspecified: Secondary | ICD-10-CM | POA: Diagnosis not present

## 2014-07-24 DIAGNOSIS — G4700 Insomnia, unspecified: Secondary | ICD-10-CM | POA: Diagnosis present

## 2014-07-24 DIAGNOSIS — L89152 Pressure ulcer of sacral region, stage 2: Secondary | ICD-10-CM | POA: Diagnosis present

## 2014-07-24 DIAGNOSIS — J9601 Acute respiratory failure with hypoxia: Secondary | ICD-10-CM | POA: Diagnosis not present

## 2014-07-24 DIAGNOSIS — J40 Bronchitis, not specified as acute or chronic: Secondary | ICD-10-CM | POA: Insufficient documentation

## 2014-07-24 DIAGNOSIS — J9621 Acute and chronic respiratory failure with hypoxia: Secondary | ICD-10-CM | POA: Diagnosis present

## 2014-07-24 DIAGNOSIS — R0902 Hypoxemia: Secondary | ICD-10-CM | POA: Diagnosis present

## 2014-07-24 DIAGNOSIS — Z87891 Personal history of nicotine dependence: Secondary | ICD-10-CM

## 2014-07-24 DIAGNOSIS — J9600 Acute respiratory failure, unspecified whether with hypoxia or hypercapnia: Secondary | ICD-10-CM | POA: Diagnosis present

## 2014-07-24 DIAGNOSIS — J9690 Respiratory failure, unspecified, unspecified whether with hypoxia or hypercapnia: Secondary | ICD-10-CM

## 2014-07-24 DIAGNOSIS — J449 Chronic obstructive pulmonary disease, unspecified: Secondary | ICD-10-CM | POA: Diagnosis present

## 2014-07-24 DIAGNOSIS — R059 Cough, unspecified: Secondary | ICD-10-CM | POA: Insufficient documentation

## 2014-07-24 DIAGNOSIS — R197 Diarrhea, unspecified: Secondary | ICD-10-CM | POA: Diagnosis not present

## 2014-07-24 DIAGNOSIS — R6510 Systemic inflammatory response syndrome (SIRS) of non-infectious origin without acute organ dysfunction: Secondary | ICD-10-CM | POA: Diagnosis present

## 2014-07-24 HISTORY — DX: Acute respiratory failure with hypoxia: J96.01

## 2014-07-24 HISTORY — DX: Pneumonia, unspecified organism: J18.9

## 2014-07-24 HISTORY — DX: Dependence on supplemental oxygen: Z99.81

## 2014-07-24 HISTORY — DX: Pressure ulcer of unspecified site, unspecified stage: L89.90

## 2014-07-24 LAB — INFLUENZA PANEL BY PCR (TYPE A & B)
H1N1 flu by pcr: NOT DETECTED
INFLAPCR: NEGATIVE
Influenza B By PCR: NEGATIVE

## 2014-07-24 LAB — BASIC METABOLIC PANEL
ANION GAP: 8 (ref 5–15)
BUN: 11 mg/dL (ref 6–23)
CO2: 32 mmol/L (ref 19–32)
Calcium: 8.6 mg/dL (ref 8.4–10.5)
Chloride: 100 mmol/L (ref 96–112)
Glucose, Bld: 111 mg/dL — ABNORMAL HIGH (ref 70–99)
POTASSIUM: 3.2 mmol/L — AB (ref 3.5–5.1)
Sodium: 140 mmol/L (ref 135–145)

## 2014-07-24 LAB — CBC WITH DIFFERENTIAL/PLATELET
BASOS PCT: 0 % (ref 0–1)
Basophils Absolute: 0 10*3/uL (ref 0.0–0.1)
EOS ABS: 0.1 10*3/uL (ref 0.0–0.7)
EOS PCT: 1 % (ref 0–5)
HCT: 40.4 % (ref 36.0–46.0)
Hemoglobin: 12.9 g/dL (ref 12.0–15.0)
LYMPHS PCT: 15 % (ref 12–46)
Lymphs Abs: 1.4 10*3/uL (ref 0.7–4.0)
MCH: 28 pg (ref 26.0–34.0)
MCHC: 31.9 g/dL (ref 30.0–36.0)
MCV: 87.8 fL (ref 78.0–100.0)
Monocytes Absolute: 0.3 10*3/uL (ref 0.1–1.0)
Monocytes Relative: 4 % (ref 3–12)
NEUTROS PCT: 80 % — AB (ref 43–77)
Neutro Abs: 7.4 10*3/uL (ref 1.7–7.7)
Platelets: 167 10*3/uL (ref 150–400)
RBC: 4.6 MIL/uL (ref 3.87–5.11)
RDW: 13 % (ref 11.5–15.5)
WBC: 9.2 10*3/uL (ref 4.0–10.5)

## 2014-07-24 LAB — MAGNESIUM: Magnesium: 1.7 mg/dL (ref 1.5–2.5)

## 2014-07-24 LAB — I-STAT CG4 LACTIC ACID, ED: Lactic Acid, Venous: 0.83 mmol/L (ref 0.5–2.0)

## 2014-07-24 MED ORDER — ALBUTEROL (5 MG/ML) CONTINUOUS INHALATION SOLN
10.0000 mg/h | INHALATION_SOLUTION | RESPIRATORY_TRACT | Status: DC
  Administered 2014-07-24: 10 mg/h via RESPIRATORY_TRACT
  Filled 2014-07-24: qty 20

## 2014-07-24 MED ORDER — ALUM & MAG HYDROXIDE-SIMETH 200-200-20 MG/5ML PO SUSP
30.0000 mL | Freq: Four times a day (QID) | ORAL | Status: DC | PRN
  Filled 2014-07-24 (×2): qty 30

## 2014-07-24 MED ORDER — DEXTROSE 5 % IV SOLN
500.0000 mg | Freq: Once | INTRAVENOUS | Status: AC
  Administered 2014-07-24: 500 mg via INTRAVENOUS
  Filled 2014-07-24: qty 500

## 2014-07-24 MED ORDER — DEXTROSE 5 % IV SOLN
1.0000 g | Freq: Once | INTRAVENOUS | Status: AC
  Administered 2014-07-24: 1 g via INTRAVENOUS
  Filled 2014-07-24: qty 10

## 2014-07-24 MED ORDER — IPRATROPIUM BROMIDE 0.02 % IN SOLN
0.5000 mg | Freq: Once | RESPIRATORY_TRACT | Status: AC
  Administered 2014-07-24: 0.5 mg via RESPIRATORY_TRACT
  Filled 2014-07-24: qty 2.5

## 2014-07-24 MED ORDER — SODIUM CHLORIDE 0.9 % IV SOLN
INTRAVENOUS | Status: DC
  Administered 2014-07-24 (×2): via INTRAVENOUS

## 2014-07-24 MED ORDER — SUVOREXANT 20 MG PO TABS: 1.0000 | ORAL_TABLET | Freq: Every day | ORAL | Status: DC

## 2014-07-24 MED ORDER — LEVALBUTEROL HCL 0.63 MG/3ML IN NEBU
0.6300 mg | INHALATION_SOLUTION | Freq: Four times a day (QID) | RESPIRATORY_TRACT | Status: DC
  Administered 2014-07-24 – 2014-07-29 (×18): 0.63 mg via RESPIRATORY_TRACT
  Filled 2014-07-24 (×24): qty 3

## 2014-07-24 MED ORDER — IOHEXOL 300 MG/ML  SOLN: 80.0000 mL | Freq: Once | INTRAMUSCULAR | Status: AC | PRN

## 2014-07-24 MED ORDER — SODIUM CHLORIDE 0.9 % IJ SOLN
3.0000 mL | Freq: Two times a day (BID) | INTRAMUSCULAR | Status: DC
  Administered 2014-07-24 – 2014-08-07 (×23): 3 mL via INTRAVENOUS

## 2014-07-24 MED ORDER — IPRATROPIUM-ALBUTEROL 0.5-2.5 (3) MG/3ML IN SOLN
3.0000 mL | Freq: Once | RESPIRATORY_TRACT | Status: DC
  Filled 2014-07-24: qty 3

## 2014-07-24 MED ORDER — IOHEXOL 350 MG/ML SOLN
80.0000 mL | Freq: Once | INTRAVENOUS | Status: AC | PRN
  Administered 2014-07-24: 80 mL via INTRAVENOUS

## 2014-07-24 MED ORDER — GABAPENTIN 300 MG PO CAPS
600.0000 mg | ORAL_CAPSULE | Freq: Three times a day (TID) | ORAL | Status: DC
  Administered 2014-07-24 – 2014-07-26 (×5): 600 mg via ORAL
  Filled 2014-07-24 (×5): qty 2
  Filled 2014-07-24: qty 6

## 2014-07-24 MED ORDER — DEXTROSE 5 % IV SOLN
500.0000 mg | INTRAVENOUS | Status: DC
  Filled 2014-07-24: qty 500

## 2014-07-24 MED ORDER — ONDANSETRON HCL 4 MG/2ML IJ SOLN
4.0000 mg | Freq: Four times a day (QID) | INTRAMUSCULAR | Status: DC | PRN
  Administered 2014-07-29: 4 mg via INTRAVENOUS
  Filled 2014-07-24 (×2): qty 2

## 2014-07-24 MED ORDER — ONDANSETRON HCL 4 MG/2ML IJ SOLN
4.0000 mg | Freq: Once | INTRAMUSCULAR | Status: AC
  Administered 2014-07-24: 4 mg via INTRAVENOUS
  Filled 2014-07-24: qty 2

## 2014-07-24 MED ORDER — ENOXAPARIN SODIUM 40 MG/0.4ML ~~LOC~~ SOLN
40.0000 mg | SUBCUTANEOUS | Status: DC
  Administered 2014-07-24: 40 mg via SUBCUTANEOUS
  Filled 2014-07-24: qty 0.4

## 2014-07-24 MED ORDER — SODIUM CHLORIDE 0.9 % IV SOLN: INTRAVENOUS | Status: AC

## 2014-07-24 MED ORDER — ACETAMINOPHEN 650 MG RE SUPP: 650.0000 mg | Freq: Four times a day (QID) | RECTAL | Status: DC | PRN

## 2014-07-24 MED ORDER — ALBUTEROL (5 MG/ML) CONTINUOUS INHALATION SOLN
INHALATION_SOLUTION | RESPIRATORY_TRACT | Status: AC
  Filled 2014-07-24: qty 20

## 2014-07-24 MED ORDER — SODIUM CHLORIDE 0.9 % IV BOLUS (SEPSIS)
1000.0000 mL | Freq: Once | INTRAVENOUS | Status: AC
  Administered 2014-07-24: 1000 mL via INTRAVENOUS

## 2014-07-24 MED ORDER — ONDANSETRON HCL 4 MG PO TABS: 4.0000 mg | ORAL_TABLET | Freq: Four times a day (QID) | ORAL | Status: DC | PRN

## 2014-07-24 MED ORDER — ALBUTEROL SULFATE (2.5 MG/3ML) 0.083% IN NEBU: 2.5000 mg | INHALATION_SOLUTION | RESPIRATORY_TRACT | Status: DC

## 2014-07-24 MED ORDER — POTASSIUM CHLORIDE CRYS ER 20 MEQ PO TBCR
40.0000 meq | EXTENDED_RELEASE_TABLET | Freq: Once | ORAL | Status: AC
  Administered 2014-07-24: 40 meq via ORAL
  Filled 2014-07-24: qty 2

## 2014-07-24 MED ORDER — CEFTRIAXONE SODIUM IN DEXTROSE 20 MG/ML IV SOLN
1.0000 g | INTRAVENOUS | Status: DC
  Filled 2014-07-24: qty 50

## 2014-07-24 MED ORDER — ACETAMINOPHEN 325 MG PO TABS
650.0000 mg | ORAL_TABLET | Freq: Four times a day (QID) | ORAL | Status: DC | PRN
  Administered 2014-07-25 – 2014-07-28 (×2): 650 mg via ORAL
  Filled 2014-07-24 (×2): qty 2

## 2014-07-24 MED ORDER — ALPRAZOLAM 0.25 MG PO TABS: 0.5000 mg | ORAL_TABLET | Freq: Every evening | ORAL | Status: DC | PRN

## 2014-07-24 MED ORDER — TEMAZEPAM 15 MG PO CAPS
30.0000 mg | ORAL_CAPSULE | Freq: Every evening | ORAL | Status: DC | PRN
  Administered 2014-07-24 – 2014-07-29 (×6): 30 mg via ORAL
  Filled 2014-07-24 (×6): qty 2

## 2014-07-24 MED ORDER — BACLOFEN 10 MG PO TABS
40.0000 mg | ORAL_TABLET | Freq: Four times a day (QID) | ORAL | Status: DC
  Administered 2014-07-24 – 2014-07-26 (×5): 40 mg via ORAL
  Filled 2014-07-24: qty 2
  Filled 2014-07-24 (×2): qty 4
  Filled 2014-07-24: qty 2
  Filled 2014-07-24 (×8): qty 4

## 2014-07-24 NOTE — Care Management Utilization Note (Signed)
UR completed 
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
## 2014-07-24 NOTE — ED Notes (Signed)
Floor to call back for report

## 2014-07-24 NOTE — ED Notes (Signed)
20 gauge IV placed in RAC for CT angio of chest.

## 2014-07-24 NOTE — ED Notes (Signed)
EDP made aware of BP 67/48, pt. States this is normal for her, pt. Is A&O, non symptomatic.
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
## 2014-07-24 NOTE — ED Notes (Signed)
EMS called out for shortness of breath; quadriplegic from home; wears home O2 at night; Upon EMS arrival, O2 sats 74%, given albuterol treatment, coughing up yellow sputum
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
## 2014-07-25 ENCOUNTER — Inpatient Hospital Stay (HOSPITAL_COMMUNITY): Payer: BLUE CROSS/BLUE SHIELD

## 2014-07-25 LAB — BLOOD GAS, ARTERIAL
Acid-Base Excess: 4.9 mmol/L — ABNORMAL HIGH (ref 0.0–2.0)
BICARBONATE: 29.4 meq/L — AB (ref 20.0–24.0)
DRAWN BY: 38235
Drawn by: 23534
FIO2: 0.44 %
FIO2: 0.45 %
LHR: 25 {breaths}/min
MECHVT: 450 mL
O2 Content: 45 L/min
O2 Saturation: 91.5 %
O2 Saturation: 93.4 %
PATIENT TEMPERATURE: 37
PCO2 ART: 47.7 mmHg — AB (ref 35.0–45.0)
PEEP/CPAP: 5 cmH2O
PH ART: 7.406 (ref 7.350–7.450)
PO2 ART: 76.4 mmHg — AB (ref 80.0–100.0)
Patient temperature: 37
TCO2: 26.4 mmol/L (ref 0–100)
pH, Arterial: 7.103 — CL (ref 7.350–7.450)
pO2, Arterial: 60.4 mmHg — ABNORMAL LOW (ref 80.0–100.0)

## 2014-07-25 LAB — BASIC METABOLIC PANEL
Anion gap: 6 (ref 5–15)
BUN: 7 mg/dL (ref 6–23)
CO2: 29 mmol/L (ref 19–32)
Calcium: 7.6 mg/dL — ABNORMAL LOW (ref 8.4–10.5)
Chloride: 105 mmol/L (ref 96–112)
Creatinine, Ser: <0.3 mg/dL — ABNORMAL LOW (ref 0.50–1.10)
Glucose, Bld: 120 mg/dL — ABNORMAL HIGH (ref 70–99)
POTASSIUM: 3.7 mmol/L (ref 3.5–5.1)
Sodium: 140 mmol/L (ref 135–145)

## 2014-07-25 LAB — CBC
HEMATOCRIT: 35.9 % — AB (ref 36.0–46.0)
Hemoglobin: 11.2 g/dL — ABNORMAL LOW (ref 12.0–15.0)
MCH: 28 pg (ref 26.0–34.0)
MCHC: 31.2 g/dL (ref 30.0–36.0)
MCV: 89.8 fL (ref 78.0–100.0)
PLATELETS: 113 10*3/uL — AB (ref 150–400)
RBC: 4 MIL/uL (ref 3.87–5.11)
RDW: 13.5 % (ref 11.5–15.5)
WBC: 6.2 10*3/uL (ref 4.0–10.5)

## 2014-07-25 LAB — GLUCOSE, CAPILLARY
GLUCOSE-CAPILLARY: 90 mg/dL (ref 70–99)
GLUCOSE-CAPILLARY: 103 mg/dL — AB (ref 70–99)
Glucose-Capillary: 102 mg/dL — ABNORMAL HIGH (ref 70–99)
Glucose-Capillary: 76 mg/dL (ref 70–99)

## 2014-07-25 LAB — URINE MICROSCOPIC-ADD ON

## 2014-07-25 LAB — URINALYSIS, ROUTINE W REFLEX MICROSCOPIC
Bilirubin Urine: NEGATIVE
Glucose, UA: NEGATIVE mg/dL
Ketones, ur: 15 mg/dL — AB
Nitrite: NEGATIVE
Protein, ur: NEGATIVE mg/dL
Specific Gravity, Urine: 1.025 (ref 1.005–1.030)
Urobilinogen, UA: 0.2 mg/dL (ref 0.0–1.0)
pH: 5.5 (ref 5.0–8.0)

## 2014-07-25 LAB — LACTIC ACID, PLASMA: LACTIC ACID, VENOUS: 0.6 mmol/L (ref 0.5–2.0)

## 2014-07-25 LAB — STREP PNEUMONIAE URINARY ANTIGEN: STREP PNEUMO URINARY ANTIGEN: NEGATIVE

## 2014-07-25 LAB — MRSA PCR SCREENING: MRSA by PCR: NEGATIVE

## 2014-07-25 MED ORDER — PIPERACILLIN-TAZOBACTAM 3.375 G IVPB
3.3750 g | Freq: Three times a day (TID) | INTRAVENOUS | Status: DC
  Administered 2014-07-25 – 2014-07-26 (×5): 3.375 g via INTRAVENOUS
  Filled 2014-07-25 (×8): qty 50

## 2014-07-25 MED ORDER — FENTANYL CITRATE 0.05 MG/ML IJ SOLN
100.0000 ug | INTRAMUSCULAR | Status: DC | PRN
  Administered 2014-07-25 – 2014-07-26 (×2): 100 ug via INTRAVENOUS
  Administered 2014-07-26: 25 ug via INTRAVENOUS
  Administered 2014-07-26 – 2014-07-29 (×4): 100 ug via INTRAVENOUS
  Filled 2014-07-25 (×7): qty 2

## 2014-07-25 MED ORDER — FENTANYL CITRATE 0.05 MG/ML IJ SOLN
100.0000 ug | INTRAMUSCULAR | Status: AC | PRN
  Administered 2014-07-25 (×3): 100 ug via INTRAVENOUS
  Filled 2014-07-25 (×3): qty 2

## 2014-07-25 MED ORDER — SODIUM CHLORIDE 0.9 % IV SOLN
INTRAVENOUS | Status: DC
  Administered 2014-07-25 – 2014-07-27 (×4): via INTRAVENOUS

## 2014-07-25 MED ORDER — VITAL HIGH PROTEIN PO LIQD
1000.0000 mL | ORAL | Status: DC
  Administered 2014-07-25 – 2014-07-29 (×6): 1000 mL
  Filled 2014-07-25 (×13): qty 1000

## 2014-07-25 MED ORDER — ENOXAPARIN SODIUM 30 MG/0.3ML ~~LOC~~ SOLN
30.0000 mg | SUBCUTANEOUS | Status: DC
  Administered 2014-07-25 – 2014-08-06 (×13): 30 mg via SUBCUTANEOUS
  Filled 2014-07-25 (×14): qty 0.3

## 2014-07-25 MED ORDER — ALPRAZOLAM 0.5 MG PO TABS
0.5000 mg | ORAL_TABLET | Freq: Every evening | ORAL | Status: DC | PRN
  Administered 2014-07-25 – 2014-07-29 (×5): 0.5 mg via ORAL
  Filled 2014-07-25: qty 1
  Filled 2014-07-25: qty 2
  Filled 2014-07-25 (×3): qty 1

## 2014-07-25 MED ORDER — VANCOMYCIN HCL IN DEXTROSE 750-5 MG/150ML-% IV SOLN
INTRAVENOUS | Status: AC
  Filled 2014-07-25: qty 150

## 2014-07-25 MED ORDER — CHLORHEXIDINE GLUCONATE 0.12 % MT SOLN
15.0000 mL | Freq: Two times a day (BID) | OROMUCOSAL | Status: DC
  Administered 2014-07-25 – 2014-07-30 (×11): 15 mL via OROMUCOSAL
  Filled 2014-07-25 (×12): qty 15

## 2014-07-25 MED ORDER — SODIUM CHLORIDE 0.9 % IV BOLUS (SEPSIS)
1000.0000 mL | Freq: Once | INTRAVENOUS | Status: AC
  Administered 2014-07-25: 1000 mL via INTRAVENOUS

## 2014-07-25 MED ORDER — PIPERACILLIN-TAZOBACTAM 3.375 G IVPB
INTRAVENOUS | Status: AC
  Filled 2014-07-25: qty 50

## 2014-07-25 MED ORDER — INSULIN ASPART 100 UNIT/ML ~~LOC~~ SOLN
0.0000 [IU] | SUBCUTANEOUS | Status: DC
  Administered 2014-07-26 – 2014-07-27 (×3): 1 [IU] via SUBCUTANEOUS
  Administered 2014-07-27: 4 [IU] via SUBCUTANEOUS
  Administered 2014-07-27 (×2): 1 [IU] via SUBCUTANEOUS
  Administered 2014-07-28: 2 [IU] via SUBCUTANEOUS
  Administered 2014-07-29 – 2014-07-31 (×3): 1 [IU] via SUBCUTANEOUS
  Administered 2014-08-01: 3 [IU] via SUBCUTANEOUS
  Administered 2014-08-01: 2 [IU] via SUBCUTANEOUS
  Administered 2014-08-02: 1 [IU] via SUBCUTANEOUS

## 2014-07-25 MED ORDER — PANTOPRAZOLE SODIUM 40 MG IV SOLR
40.0000 mg | Freq: Every day | INTRAVENOUS | Status: DC
  Administered 2014-07-25 – 2014-07-30 (×6): 40 mg via INTRAVENOUS
  Filled 2014-07-25 (×7): qty 40

## 2014-07-25 MED ORDER — VANCOMYCIN HCL IN DEXTROSE 750-5 MG/150ML-% IV SOLN
750.0000 mg | Freq: Once | INTRAVENOUS | Status: AC
  Administered 2014-07-25: 750 mg via INTRAVENOUS
  Filled 2014-07-25: qty 150

## 2014-07-25 MED ORDER — CETYLPYRIDINIUM CHLORIDE 0.05 % MT LIQD
7.0000 mL | Freq: Four times a day (QID) | OROMUCOSAL | Status: DC
  Administered 2014-07-25 – 2014-07-30 (×19): 7 mL via OROMUCOSAL

## 2014-07-25 MED ORDER — VANCOMYCIN HCL 500 MG IV SOLR
500.0000 mg | INTRAVENOUS | Status: DC
  Filled 2014-07-25: qty 500

## 2014-07-25 NOTE — Progress Notes (Signed)
ANTIBIOTIC CONSULT NOTE - INITIAL  Pharmacy Consult for vancomycin & Zosyn  Indication: pneumonia  No Known Allergies  Patient Measurements: Height: 5\' 5"  (165.1 cm) Weight: 88 lb 8 oz (40.143 kg) IBW/kg (Calculated) : 57 Actual body weight << than IBW  Vital Signs: Temp: 99.5 F (37.5 C) (02/02 2148) Temp Source: Oral (02/02 2148) BP: 117/62 mmHg (02/03 0256) Pulse Rate: 109 (02/03 0459) Intake/Output from previous day: 02/02 0701 - 02/03 0700 In: 593.3 [I.V.:593.3] Out: -  Intake/Output from this shift: Total I/O In: 593.3 [I.V.:593.3] Out: -   Labs:  Recent Labs  07/24/14 0959  WBC 9.2  HGB 12.9  PLT 167  CREATININE <0.30*   CrCl cannot be calculated (Patient has no serum creatinine result on file.).  Due to long hx of quadriplegia, loss of muscle tissue and resulting decreased SCr, CrCl cannot be calculated.  I/O's not accurate.  Must assume a possible CrCl 25-5250ml/min for age, decreased body mass, and medical hx.   Microbiology: Recent Results (from the past 720 hour(s))  Blood culture (routine x 2)     Status: None (Preliminary result)   Collection Time: 07/24/14  9:59 AM  Result Value Ref Range Status   Specimen Description BLOOD  Final   Special Requests Immunocompromised  Final   Culture NO GROWTH <24 HRS  Final   Report Status PENDING  Incomplete    Medical History: Past Medical History  Diagnosis Date  . Quadriplegia 04/03/2013  . Spinal cord injury, C5-C7 04/03/2013  . Anxiety   . Insomnia   . Acute respiratory failure with hypoxia     10/15  . CAP (community acquired pneumonia)   . Pressure ulcer     sacryn 10/15    Medications:  Scheduled:  . antiseptic oral rinse  7 mL Mouth Rinse QID  . azithromycin  500 mg Intravenous Q24H  . baclofen  40 mg Oral QID  . chlorhexidine  15 mL Mouth Rinse BID  . enoxaparin (LOVENOX) injection  40 mg Subcutaneous Q24H  . gabapentin  600 mg Oral TID  . levalbuterol  0.63 mg Nebulization Q6H  .  pantoprazole (PROTONIX) IV  40 mg Intravenous Daily  . piperacillin-tazobactam (ZOSYN)  IV  3.375 g Intravenous Q8H  . sodium chloride  1,000 mL Intravenous Once  . sodium chloride  3 mL Intravenous Q12H  . [START ON 07/26/2014] vancomycin  500 mg Intravenous Q24H  . vancomycin  750 mg Intravenous Once   Infusions:  . sodium chloride     PRN: acetaminophen **OR** acetaminophen, ALPRAZolam, alum & mag hydroxide-simeth, fentaNYL, fentaNYL, ondansetron **OR** ondansetron (ZOFRAN) IV, temazepam   Assessment & therapy goals: Will utilize standard Zosyn regimen for CrCl>3220ml/min.  Desire vancomycin trough serum level 15-1620mcg/ml. Broad spectrum coverage for CAP/aspiration pneumonia - continuation of azithromycin with Zosyn & vancomycin.  Plan:  1.  Zosyn 3.375gm IV q8h (4hr infusions) 2.  Vancomycin 750mg  IV loading dose x 1, then maintenance regimen 500mg  IV q24h 3.  Monitor for indices of infection and renal function 4.  Will need to measure actual serum trough vancomycin level when clinically appropriate  Scarlett PrestoRochette, Ed Mandich E 07/25/2014,5:07 AM

## 2014-07-25 NOTE — Care Management Note (Addendum)
    Page 1 of 2   08/07/2014     3:58:09 PM CARE MANAGEMENT NOTE 08/07/2014  Patient:  [name] Andersen,[name] Andersen   Account Number:  [id number]  Date Initiated:  07/25/2014  Documentation initiated by:  Kathyrn SheriffHILDRESS,JESSICA  Subjective/Objective Assessment:   Pt is from home with husband. Pt is currently intubated. Family is at bedside. Pt is quadraplegic and, per husband has lift, wheelchair, and "all the equiptment they need" to care for the pt at home. Pt has aid that comes for 4 hours daily.     Action/Plan:   The aid is private duty. [facility] Pt plans to discharge home with continued care from aid/family. Will continue to follow for CM needs.   Anticipated DC Date:  07/28/2014   Anticipated DC Plan:  HOME/SELF CARE      DC Planning Services  CM consult      Choice offered to / List presented to:     DME arranged  [facility] - PERCUSSION      DME agency  [employer]        Status of service:  Completed, signed off Medicare Important Message given?  YES (If response is "NO", the following Medicare IM given date fields will be blank) Date Medicare IM given:  08/07/2014 Medicare IM given by:  Letha CapeAYLOR,Samule Life Date Additional Medicare IM given:   Additional Medicare IM given by:    Discharge Disposition:  HOME/SELF CARE  Per UR Regulation:  Reviewed for med. necessity/level of care/duration of stay  If discussed at Long Length of Stay Meetings, dates discussed:    Comments:  ContactMady Gemma:  Dubreuil,Kenneth Spouse [phone]  [phone]    [name] Andersen,[name] Andersen Daughter   [phone]  08/07/14 1554 Letha Capeeborah Garlene Apperson RN, BSN [phone] patient is for dc today, MD asked NCM to order Chest vest for cough asst for patient , this information was faxed to [employer] and NCM spoke with Jeannett SeniorStephen [phone].  Informed him that patient would like to have this set up at home. Patient has 24 hr care at home.  07-31-14 2:20pm Avie ArenasSarah Brown, RNBSN [phone] Patient awake and alert - laying in  bed, sister at bedside. patient states all equipment needed at home and her own routine.  Has assistance from 8 to 12:15pm daily in which she gets her breakfast and lunch and her house cleaning gets done.  After this she is left alone for about 3 hours, inwhich she watches her soaps, reads and works on the [employer].  Husband then gets home and is with her till the morning.   This works for her - she has no further needs at this time.  07-30-14 12noon  Avie ArenasSarah Brown, RNBSN   [phone] plan for one way extubation when family ready.  Bipap ok - but if fails would do comfort.  07-27-14 9:05am Avie ArenasSarah Brown, RNBSN - [phone] Continues on vent [facility]  07/25/2014 1100 Kathyrn SheriffJessica Childress, RN, MSN, CM

## 2014-07-25 NOTE — Progress Notes (Signed)
eLink Physician-Brief Progress Note Patient Name: [name] GensJane M Vandeven DOB: 01/23/1951 MRN: [id number]   Date of Service  07/25/2014  HPI/Events of Note  [age] [facility] request from primary team to provide vent orders.  Paitent is HD stable.  eICU Interventions  Plan: Vent orders Level 1 sedation to RASS -1 with fentanyl F/U on ABG and PCXR Best Practice     Intervention Category Evaluation Type: New Patient Evaluation  Verity Gilcrest 07/25/2014, 5:33 AM

## 2014-07-25 NOTE — Progress Notes (Signed)
Dr. Letitia NeriFanta's cell phone is called. Pt VSS but mentation is not improving. Order given for stat chest xray and stat ABG.

## 2014-07-25 NOTE — Progress Notes (Signed)
Husband at bedside. Pt and husband comforted. No change in pt's condition. Awaiting ICU room.

## 2014-07-25 NOTE — Progress Notes (Signed)
Subjective: I was called by floor nurse that the patient became unresponsive and lethargic. Her breathing was labored and overall condition suddenly changed. Patient was put on high flow oxygen and ABG was done which was showed a PH of 7.1 with PCO2  That couldn't recorded. Patient also become hypotensive. She was started on BIPAP on the floor and bolus of floor and transfer to ICU. Patient is intubated,  Objective: Vital signs in last 24 hours: Temp:  [97.6 F (36.4 C)-99.5 F (37.5 C)] 99.5 F (37.5 C) (02/02 2148) Pulse Rate:  [94-133] 109 (02/03 0459) Resp:  [10-24] 18 (02/03 0352) BP: (67-127)/(47-109) 117/62 mmHg (02/03 0256) SpO2:  [80 %-100 %] 92 % (02/03 0459) Weight:  [38.556 kg (85 lb)-40.143 kg (88 lb 8 oz)] 40.143 kg (88 lb 8 oz) (02/02 1620) Weight change:  Last BM Date: 07/22/14  Intake/Output from previous day: 02/02 0701 - 02/03 0700 In: 593.3 [I.V.:593.3] Out: -   PHYSICAL EXAM General appearance: severe distress and slowed mentation Resp: diminished breath sounds bilaterally and rhonchi bilaterally Cardio: regular rate and rhythm GI: soft, non-tender; bowel sounds normal; no masses,  no organomegaly Extremities: extremities normal, atraumatic, no cyanosis or edema  Lab Results:  Results for orders placed or performed during the hospital encounter of 07/24/14 (from the past 48 hour(s))  CBC with Differential/Platelet     Status: Abnormal   Collection Time: 07/24/14  9:59 AM  Result Value Ref Range   WBC 9.2 4.0 - 10.5 K/uL   RBC 4.60 3.87 - 5.11 MIL/uL   Hemoglobin 12.9 12.0 - 15.0 g/dL   HCT 40.4 36.0 - 46.0 %   MCV 87.8 78.0 - 100.0 fL   MCH 28.0 26.0 - 34.0 pg   MCHC 31.9 30.0 - 36.0 g/dL   RDW 13.0 11.5 - 15.5 %   Platelets 167 150 - 400 K/uL   Neutrophils Relative % 80 (H) 43 - 77 %   Neutro Abs 7.4 1.7 - 7.7 K/uL   Lymphocytes Relative 15 12 - 46 %   Lymphs Abs 1.4 0.7 - 4.0 K/uL   Monocytes Relative 4 3 - 12 %   Monocytes Absolute 0.3 0.1 -  1.0 K/uL   Eosinophils Relative 1 0 - 5 %   Eosinophils Absolute 0.1 0.0 - 0.7 K/uL   Basophils Relative 0 0 - 1 %   Basophils Absolute 0.0 0.0 - 0.1 K/uL  Basic metabolic panel     Status: Abnormal   Collection Time: 07/24/14  9:59 AM  Result Value Ref Range   Sodium 140 135 - 145 mmol/L   Potassium 3.2 (L) 3.5 - 5.1 mmol/L   Chloride 100 96 - 112 mmol/L   CO2 32 19 - 32 mmol/L   Glucose, Bld 111 (H) 70 - 99 mg/dL   BUN 11 6 - 23 mg/dL   Creatinine, Ser <0.30 (L) 0.50 - 1.10 mg/dL   Calcium 8.6 8.4 - 10.5 mg/dL   GFR calc non Af Amer NOT CALCULATED >90 mL/min   GFR calc Af Amer NOT CALCULATED >90 mL/min    Comment: (NOTE) The eGFR has been calculated using the CKD EPI equation. This calculation has not been validated in all clinical situations. eGFR's persistently <90 mL/min signify possible Chronic Kidney Disease.    Anion gap 8 5 - 15  Blood culture (routine x 2)     Status: None (Preliminary result)   Collection Time: 07/24/14  9:59 AM  Result Value Ref Range   Specimen  Description BLOOD    Special Requests Immunocompromised    Culture NO GROWTH <24 HRS    Report Status PENDING   Magnesium     Status: None   Collection Time: 07/24/14  9:59 AM  Result Value Ref Range   Magnesium 1.7 1.5 - 2.5 mg/dL  I-Stat CG4 Lactic Acid, ED     Status: None   Collection Time: 07/24/14 10:04 AM  Result Value Ref Range   Lactic Acid, Venous 0.83 0.5 - 2.0 mmol/L  Influenza panel by PCR (type A & B, H1N1)     Status: None   Collection Time: 07/24/14  6:15 PM  Result Value Ref Range   Influenza A By PCR NEGATIVE NEGATIVE   Influenza B By PCR NEGATIVE NEGATIVE   H1N1 flu by pcr NOT DETECTED NOT DETECTED    Comment:        The Xpert Flu assay (FDA approved for nasal aspirates or washes and nasopharyngeal swab specimens), is intended as an aid in the diagnosis of influenza and should not be used as a sole basis for treatment.   Blood gas, arterial     Status: Abnormal   Collection  Time: 07/25/14  3:35 AM  Result Value Ref Range   FIO2 0.44 %   Delivery systems NASAL CANNULA    pH, Arterial 7.103 (LL) 7.350 - 7.450    Comment: CRITICAL RESULT CALLED TO, READ BACK BY AND VERIFIED WITH: C.MARTIN,RN BY B.STOPHEL,RRT,RCP ON 07/25/14 CRITICAL RESULT CALLED TO, READ BACK BY AND VERIFIED WITH: C.MARTIN,RN BY B.STOPHEL,RRT,RCP ON 07/25/14 AT 03:42.    pCO2 arterial ABOVE REPORTABLE RANGE 35.0 - 45.0 mmHg    Comment: C.MARTIN,RN BY B.STOPHEL,RRT,RCP ON 07/25/14 AT 03:42.   pO2, Arterial 76.4 (L) 80.0 - 100.0 mmHg   O2 Saturation 91.5 %   Patient temperature 37.0    Collection site RIGHT RADIAL    Drawn by [phone]    Sample type ARTERIAL DRAW    Allens test (pass/fail) PASS PASS    ABGS  Recent Labs  07/25/14 0335  PHART 7.103*  PO2ART 76.4*   CULTURES Recent Results (from the past 240 hour(s))  Blood culture (routine x 2)     Status: None (Preliminary result)   Collection Time: 07/24/14  9:59 AM  Result Value Ref Range Status   Specimen Description BLOOD  Final   Special Requests Immunocompromised  Final   Culture NO GROWTH <24 HRS  Final   Report Status PENDING  Incomplete   Studies/Results: Ct Angio Chest Pe W/cm &/or Wo Cm  07/24/2014   CLINICAL DATA:  Hypoxia.  EXAM: CT ANGIOGRAPHY CHEST WITH CONTRAST  TECHNIQUE: Multidetector CT imaging of the chest was performed using the standard protocol during bolus administration of intravenous contrast. Multiplanar CT image reconstructions and MIPs were obtained to evaluate the vascular anatomy.  CONTRAST:  29mL OMNIPAQUE IOHEXOL 350 MG/ML SOLN  COMPARISON:  CT scan of March 10, 2014.  FINDINGS: No pneumothorax is noted. No significant pleural effusion is noted. Consolidation is noted in superior segment of left lower lobe consistent with pneumonia or subsegmental atelectasis. Material is noted within the lower lobe bronchus consistent with mucous plug or other inflammatory debris. Soft tissue debris is also noted in  several lower lobe bronchi on the right side suggesting aspiration. There is no evidence of thoracic aortic dissection or aneurysm. There is no definite evidence of pulmonary embolus. No mediastinal mass or adenopathy is noted. Visualized portion of upper abdomen appears normal.  Review of  the MIP images confirms the above findings.  IMPRESSION: No evidence of pulmonary embolus.  Consolidation of the superior segment of left lower lobe is noted consistent with pneumonia or subsegmental atelectasis. The bronchus in this area appears to be occluded suggesting mucous plug or aspirated debris.  Soft tissue debris is also noted within several lower lobe bronchi on the right side suggesting aspiration as well.   Electronically Signed   By: Sabino Dick M.D.   On: 07/24/2014 13:33   Dg Chest Port 1 View  07/25/2014   CLINICAL DATA:  Shortness of breath and respiratory distress.  EXAM: PORTABLE CHEST - 1 VIEW  COMPARISON:  Radiographs and CT obtained yesterday.  FINDINGS: The lungs are hyperinflated with underlying emphysema. The medial left lower lobe consolidation on prior CT is not well defined, however questioned increase in density from prior. No new consolidation in the right lung. The heart size is normal. There is no pulmonary edema. No large pleural effusion or pneumothorax.  IMPRESSION: Medial left lower lobe consolidation, likely mildly increased compared to prior exam. Underlying emphysema again seen.   Electronically Signed   By: Jeb Levering M.D.   On: 07/25/2014 04:04   Dg Chest Port 1 View  07/24/2014   CLINICAL DATA:  One day history of congestion  EXAM: PORTABLE CHEST - 1 VIEW  COMPARISON:  March 16, 2014  FINDINGS: There is evidence suggesting a degree of underlying emphysematous change. There is no edema or consolidation. Heart size and pulmonary vascularity are normal. No adenopathy. There is wire fixation in the lower cervical spine.  IMPRESSION: Underlying emphysematous change.  No edema  or consolidation.   Electronically Signed   By: Lowella Grip M.D.   On: 07/24/2014 10:58    Medications: I have reviewed the patient's current medications.  Assesment:   Principal Problem:   Acute respiratory failure Active Problems:   Quadriplegia   Protein-calorie malnutrition, severe   Hypokalemia   Hypoxemia   SIRS (systemic inflammatory response syndrome)   Acute respiratory failure with hypoxia   Bronchitis   Cough  Acute respiratory failure with CO2 Retention  Plan:  Patients medications and medical record reviewed Discussed with ER physician to tube her start on ventilatory support Will do stat ABG, CBC, BMP and lactic acid level Will start on combination of Vanc and zosyn Will continue N/S at 150/hr Further recommendations and follow up by her Pulmonologist and primary physician Dr. Luan Pulling.      LOS: 1 day   Kiyona Mcnall 07/25/2014, 5:04 AM

## 2014-07-25 NOTE — Progress Notes (Signed)
Respiratory called with ABG results. PH 7.10, PO2 76.4, stating at 92%, and CO2 will not read. Dr. Felecia ShellingFanta called again.

## 2014-07-25 NOTE — Progress Notes (Signed)
Respiratory in room. Breathing treatment given. Pt 's VSS but mentation is still off.

## 2014-07-25 NOTE — Progress Notes (Addendum)
Pt request not to be awaken throughout the night. She states she does not want even her temperature taken. Pt made as comfortable as possible and told that I will monitor her telemetry and try not to bother her throughout the night.

## 2014-07-25 NOTE — Progress Notes (Signed)
Pt called to desk stating she doesn't feel right. VSS. Mentation off from earlier in shift. Dr. Felecia ShellingFanta called.

## 2014-07-25 NOTE — Progress Notes (Signed)
Pt wants to take sleeping medication(Belsonra 10 mg) from home that the hospital does not have. Dr Felecia ShellingFanta is called and states that it will be fine for pt to take sleeping medication from home.

## 2014-07-25 NOTE — Progress Notes (Signed)
Pt is transferred to ICU and report is given to ICU nurse, Gaynelle Aduobbie, RN

## 2014-07-25 NOTE — Progress Notes (Signed)
Subjective: She was admitted with what appeared to be an upper respiratory infection. She has quadriplegia and her ventilatory status is marginal at baseline. She has had to be intubated and placed on mechanical ventilation in the past. She has significant nutritional compromise as well. She is on a number of medications for spasm and she takes a number of medications to try to help her sleep. Chest x-ray now shows pneumonia. She is intubated and on a ventilator.  Objective: Vital signs in last 24 hours: Temp:  [96.2 F (35.7 C)-99.5 F (37.5 C)] 96.2 F (35.7 C) (02/03 0530) Pulse Rate:  [74-133] 74 (02/03 0600) Resp:  [10-25] 25 (02/03 0600) BP: (67-127)/(47-109) 102/72 mmHg (02/03 0600) SpO2:  [80 %-100 %] 98 % (02/03 0750) FiO2 (%):  [45 %] 45 % (02/03 0750) Weight:  [38.556 kg (85 lb)-42.4 kg (93 lb 7.6 oz)] 42.4 kg (93 lb 7.6 oz) (02/03 0530) Weight change:  Last BM Date: 07/22/14  Intake/Output from previous day: 02/02 0701 - 02/03 0700 In: 705.8 [I.V.:705.8] Out: 2100 [Urine:2100]  PHYSICAL EXAM General appearance: alert and Intubated sedated but awake on the ventilator and very thin Resp: rhonchi bilaterally Cardio: regular rate and rhythm, S1, S2 normal, no murmur, click, rub or gallop GI: soft, non-tender; bowel sounds normal; no masses,  no organomegaly Extremities: extremities normal, atraumatic, no cyanosis or edema  Lab Results:  Results for orders placed or performed during the hospital encounter of 07/24/14 (from the past 48 hour(s))  CBC with Differential/Platelet     Status: Abnormal   Collection Time: 07/24/14  9:59 AM  Result Value Ref Range   WBC 9.2 4.0 - 10.5 K/uL   RBC 4.60 3.87 - 5.11 MIL/uL   Hemoglobin 12.9 12.0 - 15.0 g/dL   HCT 40.4 36.0 - 46.0 %   MCV 87.8 78.0 - 100.0 fL   MCH 28.0 26.0 - 34.0 pg   MCHC 31.9 30.0 - 36.0 g/dL   RDW 13.0 11.5 - 15.5 %   Platelets 167 150 - 400 K/uL   Neutrophils Relative % 80 (H) 43 - 77 %   Neutro Abs 7.4  1.7 - 7.7 K/uL   Lymphocytes Relative 15 12 - 46 %   Lymphs Abs 1.4 0.7 - 4.0 K/uL   Monocytes Relative 4 3 - 12 %   Monocytes Absolute 0.3 0.1 - 1.0 K/uL   Eosinophils Relative 1 0 - 5 %   Eosinophils Absolute 0.1 0.0 - 0.7 K/uL   Basophils Relative 0 0 - 1 %   Basophils Absolute 0.0 0.0 - 0.1 K/uL  Basic metabolic panel     Status: Abnormal   Collection Time: 07/24/14  9:59 AM  Result Value Ref Range   Sodium 140 135 - 145 mmol/L   Potassium 3.2 (L) 3.5 - 5.1 mmol/L   Chloride 100 96 - 112 mmol/L   CO2 32 19 - 32 mmol/L   Glucose, Bld 111 (H) 70 - 99 mg/dL   BUN 11 6 - 23 mg/dL   Creatinine, Ser <0.30 (L) 0.50 - 1.10 mg/dL   Calcium 8.6 8.4 - 10.5 mg/dL   GFR calc non Af Amer NOT CALCULATED >90 mL/min   GFR calc Af Amer NOT CALCULATED >90 mL/min    Comment: (NOTE) The eGFR has been calculated using the CKD EPI equation. This calculation has not been validated in all clinical situations. eGFR's persistently <90 mL/min signify possible Chronic Kidney Disease.    Anion gap 8 5 - 15  Blood culture (routine x 2)     Status: None (Preliminary result)   Collection Time: 07/24/14  9:59 AM  Result Value Ref Range   Specimen Description BLOOD    Special Requests Immunocompromised    Culture NO GROWTH <24 HRS    Report Status PENDING   Magnesium     Status: None   Collection Time: 07/24/14  9:59 AM  Result Value Ref Range   Magnesium 1.7 1.5 - 2.5 mg/dL  I-Stat CG4 Lactic Acid, ED     Status: None   Collection Time: 07/24/14 10:04 AM  Result Value Ref Range   Lactic Acid, Venous 0.83 0.5 - 2.0 mmol/L  Influenza panel by PCR (type A & B, H1N1)     Status: None   Collection Time: 07/24/14  6:15 PM  Result Value Ref Range   Influenza A By PCR NEGATIVE NEGATIVE   Influenza B By PCR NEGATIVE NEGATIVE   H1N1 flu by pcr NOT DETECTED NOT DETECTED    Comment:        The Xpert Flu assay (FDA approved for nasal aspirates or washes and nasopharyngeal swab specimens), is intended as  an aid in the diagnosis of influenza and should not be used as a sole basis for treatment.   Blood gas, arterial     Status: Abnormal   Collection Time: 07/25/14  3:35 AM  Result Value Ref Range   FIO2 0.44 %   Delivery systems NASAL CANNULA    pH, Arterial 7.103 (LL) 7.350 - 7.450    Comment: CRITICAL RESULT CALLED TO, READ BACK BY AND VERIFIED WITH: C.MARTIN,RN BY B.STOPHEL,RRT,RCP ON 07/25/14 CRITICAL RESULT CALLED TO, READ BACK BY AND VERIFIED WITH: C.MARTIN,RN BY B.STOPHEL,RRT,RCP ON 07/25/14 AT 03:42.    pCO2 arterial ABOVE REPORTABLE RANGE 35.0 - 45.0 mmHg    Comment: C.MARTIN,RN BY B.STOPHEL,RRT,RCP ON 07/25/14 AT 03:42.   pO2, Arterial 76.4 (L) 80.0 - 100.0 mmHg   O2 Saturation 91.5 %   Patient temperature 37.0    Collection site RIGHT RADIAL    Drawn by [phone]    Sample type ARTERIAL DRAW    Allens test (pass/fail) PASS PASS  Basic metabolic panel     Status: Abnormal   Collection Time: 07/25/14  5:20 AM  Result Value Ref Range   Sodium 140 135 - 145 mmol/L   Potassium 3.7 3.5 - 5.1 mmol/L   Chloride 105 96 - 112 mmol/L   CO2 29 19 - 32 mmol/L   Glucose, Bld 120 (H) 70 - 99 mg/dL   BUN 7 6 - 23 mg/dL   Creatinine, Ser <0.30 (L) 0.50 - 1.10 mg/dL   Calcium 7.6 (L) 8.4 - 10.5 mg/dL   GFR calc non Af Amer NOT CALCULATED >90 mL/min   GFR calc Af Amer NOT CALCULATED >90 mL/min    Comment: (NOTE) The eGFR has been calculated using the CKD EPI equation. This calculation has not been validated in all clinical situations. eGFR's persistently <90 mL/min signify possible Chronic Kidney Disease.    Anion gap 6 5 - 15  CBC     Status: Abnormal   Collection Time: 07/25/14  5:20 AM  Result Value Ref Range   WBC 6.2 4.0 - 10.5 K/uL   RBC 4.00 3.87 - 5.11 MIL/uL   Hemoglobin 11.2 (L) 12.0 - 15.0 g/dL   HCT 35.9 (L) 36.0 - 46.0 %   MCV 89.8 78.0 - 100.0 fL   MCH 28.0 26.0 - 34.0 pg  MCHC 31.2 30.0 - 36.0 g/dL   RDW 13.5 11.5 - 15.5 %   Platelets 113 (L) 150 - 400 K/uL     Comment: SPECIMEN CHECKED FOR CLOTS PLATELET COUNT CONFIRMED BY SMEAR   Lactic acid, plasma     Status: None   Collection Time: 07/25/14  5:20 AM  Result Value Ref Range   Lactic Acid, Venous 0.6 0.5 - 2.0 mmol/L  Draw ABG 1 hour after initiation of ventilator     Status: Abnormal   Collection Time: 07/25/14  7:23 AM  Result Value Ref Range   FIO2 0.45 %   O2 Content 45.0 L/min   Delivery systems VENTILATOR    Mode PRESSURE REGULATED VOLUME CONTROL    VT 450 mL   Rate 25 resp/min   Peep/cpap 5.0 cm H20   pH, Arterial 7.406 7.350 - 7.450   pCO2 arterial 47.7 (H) 35.0 - 45.0 mmHg   pO2, Arterial 60.4 (L) 80.0 - 100.0 mmHg   Bicarbonate 29.4 (H) 20.0 - 24.0 mEq/L   TCO2 26.4 0 - 100 mmol/L   Acid-Base Excess 4.9 (H) 0.0 - 2.0 mmol/L   O2 Saturation 93.4 %   Patient temperature 37.0    Collection site LEFT RADIAL    Drawn by [phone]    Sample type ARTERIAL    Allens test (pass/fail) PASS PASS    ABGS  Recent Labs  07/25/14 0723  PHART 7.406  PO2ART 60.4*  TCO2 26.4  HCO3 29.4*   CULTURES Recent Results (from the past 240 hour(s))  Blood culture (routine x 2)     Status: None (Preliminary result)   Collection Time: 07/24/14  9:59 AM  Result Value Ref Range Status   Specimen Description BLOOD  Final   Special Requests Immunocompromised  Final   Culture NO GROWTH <24 HRS  Final   Report Status PENDING  Incomplete   Studies/Results: Ct Angio Chest Pe W/cm &/or Wo Cm  07/24/2014   CLINICAL DATA:  Hypoxia.  EXAM: CT ANGIOGRAPHY CHEST WITH CONTRAST  TECHNIQUE: Multidetector CT imaging of the chest was performed using the standard protocol during bolus administration of intravenous contrast. Multiplanar CT image reconstructions and MIPs were obtained to evaluate the vascular anatomy.  CONTRAST:  15mL OMNIPAQUE IOHEXOL 350 MG/ML SOLN  COMPARISON:  CT scan of March 10, 2014.  FINDINGS: No pneumothorax is noted. No significant pleural effusion is noted. Consolidation is noted  in superior segment of left lower lobe consistent with pneumonia or subsegmental atelectasis. Material is noted within the lower lobe bronchus consistent with mucous plug or other inflammatory debris. Soft tissue debris is also noted in several lower lobe bronchi on the right side suggesting aspiration. There is no evidence of thoracic aortic dissection or aneurysm. There is no definite evidence of pulmonary embolus. No mediastinal mass or adenopathy is noted. Visualized portion of upper abdomen appears normal.  Review of the MIP images confirms the above findings.  IMPRESSION: No evidence of pulmonary embolus.  Consolidation of the superior segment of left lower lobe is noted consistent with pneumonia or subsegmental atelectasis. The bronchus in this area appears to be occluded suggesting mucous plug or aspirated debris.  Soft tissue debris is also noted within several lower lobe bronchi on the right side suggesting aspiration as well.   Electronically Signed   By: Sabino Dick M.D.   On: 07/24/2014 13:33   Portable Chest Xray  07/25/2014   CLINICAL DATA:  Endotracheal tube placement.  EXAM: PORTABLE CHEST -  1 VIEW  COMPARISON:  Radiograph earlier this day at 0338 hr  FINDINGS: Portable AP view at 0515 hr: Placement of endotracheal tube, tip 4.5 cm from the carina. The lungs remain hyperinflated. Consolidation in the medial left lower lobe is again seen. Mild increase in bilateral upper lobe perihilar markings, may reflect cephalization and developing vascular congestion. The heart size is normal. No large pleural effusion or evident pneumothorax.  IMPRESSION: 1. Endotracheal tube 4.5 cm from the carina. 2. Medial left lower lobe consolidation. 3. Increasing perihilar upper lobe markings, may reflect cephalization and developing vascular congestion.   Electronically Signed   By: Jeb Levering M.D.   On: 07/25/2014 05:32   Dg Chest Port 1 View  07/25/2014   CLINICAL DATA:  Shortness of breath and respiratory  distress.  EXAM: PORTABLE CHEST - 1 VIEW  COMPARISON:  Radiographs and CT obtained yesterday.  FINDINGS: The lungs are hyperinflated with underlying emphysema. The medial left lower lobe consolidation on prior CT is not well defined, however questioned increase in density from prior. No new consolidation in the right lung. The heart size is normal. There is no pulmonary edema. No large pleural effusion or pneumothorax.  IMPRESSION: Medial left lower lobe consolidation, likely mildly increased compared to prior exam. Underlying emphysema again seen.   Electronically Signed   By: Jeb Levering M.D.   On: 07/25/2014 04:04   Dg Chest Port 1 View  07/24/2014   CLINICAL DATA:  One day history of congestion  EXAM: PORTABLE CHEST - 1 VIEW  COMPARISON:  March 16, 2014  FINDINGS: There is evidence suggesting a degree of underlying emphysematous change. There is no edema or consolidation. Heart size and pulmonary vascularity are normal. No adenopathy. There is wire fixation in the lower cervical spine.  IMPRESSION: Underlying emphysematous change.  No edema or consolidation.   Electronically Signed   By: Lowella Grip M.D.   On: 07/24/2014 10:58    Medications:  Prior to Admission:  Prescriptions prior to admission  Medication Sig Dispense Refill Last Dose  . albuterol (PROVENTIL) (2.5 MG/3ML) 0.083% nebulizer solution Take 2.5 mg by nebulization 4 (four) times daily as needed for wheezing or shortness of breath.   07/24/2014 at Unknown time  . ALPRAZolam (XANAX XR) 0.5 MG 24 hr tablet Take 0.5 mg by mouth at bedtime as needed for sleep.    07/23/2014 at Unknown time  . baclofen (LIORESAL) 20 MG tablet Take 40 mg by mouth 4 (four) times daily.    07/24/2014 at Unknown time  . BELSOMRA 20 MG TABS Take 1 tablet by mouth daily.   07/24/2014 at Unknown time  . gabapentin (NEURONTIN) 600 MG tablet Take 600 mg by mouth 3 (three) times daily.   07/24/2014 at Unknown time  . levalbuterol (XOPENEX) 0.63 MG/3ML nebulizer  solution Take 0.63 mg by nebulization.   07/24/2014 at Unknown time  . Multiple Vitamin (MULTIVITAMIN WITH MINERALS) TABS tablet Take 1 tablet by mouth daily.   07/24/2014 at Unknown time  . NUCYNTA 50 MG TABS tablet Take 50 mg by mouth daily as needed for severe pain.    unknown  . temazepam (RESTORIL) 30 MG capsule Take 30 mg by mouth at bedtime as needed for sleep.    07/23/2014 at Unknown time  . amoxicillin-clavulanate (AUGMENTIN) 500-125 MG per tablet Take 1 tablet (500 mg total) by mouth 3 (three) times daily. (Patient not taking: Reported on 07/24/2014) 20 tablet 0 Completed Course at Unknown time   Scheduled: .  antiseptic oral rinse  7 mL Mouth Rinse QID  . azithromycin  500 mg Intravenous Q24H  . baclofen  40 mg Oral QID  . chlorhexidine  15 mL Mouth Rinse BID  . enoxaparin (LOVENOX) injection  40 mg Subcutaneous Q24H  . gabapentin  600 mg Oral TID  . levalbuterol  0.63 mg Nebulization Q6H  . pantoprazole (PROTONIX) IV  40 mg Intravenous Daily  . piperacillin-tazobactam (ZOSYN)  IV  3.375 g Intravenous Q8H  . sodium chloride  3 mL Intravenous Q12H  . [START ON 07/26/2014] vancomycin  500 mg Intravenous Q24H   Continuous: . sodium chloride 150 mL/hr at 07/25/14 0516   AJO:INOMVEHMCNOBS **OR** acetaminophen, ALPRAZolam, alum & mag hydroxide-simeth, fentaNYL, ondansetron **OR** ondansetron (ZOFRAN) IV, temazepam  Assesment: She was admitted with what was thought to be bronchitis and acute hypoxic respiratory failure. She initially did well then developed increasing problems with shortness of breath and lethargy and was found to have hypercapnic respiratory failure. She was intubated and started on mechanical ventilation. She is now shown to have pneumonia in the left lung. At baseline she has quadriplegia and marginal respiratory reserve. She has severe protein calorie malnutrition. She has a lot of problems with muscle spasm and insomnia. Principal Problem:   Acute respiratory failure Active  Problems:   Quadriplegia   Protein-calorie malnutrition, severe   Hypokalemia   Hypoxemia   SIRS (systemic inflammatory response syndrome)   Acute respiratory failure with hypoxia   Bronchitis   Cough    Plan: Continue with current antibiotics. Start tube feedings.    LOS: 1 day   Kaeya Schiffer L 07/25/2014, 8:11 AM

## 2014-07-25 NOTE — Progress Notes (Signed)
[facility]le[facility] ShellingFanta. Pt is to placed on bipap and moved to ICU, but there is no beds available.
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
## 2014-07-25 NOTE — Progress Notes (Addendum)
Pt placed on Bipap and husband is called. Pt is not left alone. Order given to Charge Nurse by Dr. Felecia ShellingFanta to give 1000 ml bolus. Bolus is started.

## 2014-07-25 NOTE — ED Provider Notes (Signed)
Call to icu to intubate pt.   She was given 20mg  etomidate and 100 mg succ.  Pt was intubate with a #7 ET   Tube without any difficulties   Benny LennertJoseph L Lamin Chandley, MD 07/25/14 [phone]

## 2014-07-25 NOTE — Progress Notes (Signed)
Dr.[name] called again. Waiting on response.

## 2014-07-26 ENCOUNTER — Encounter (HOSPITAL_COMMUNITY): Payer: Self-pay | Admitting: General Practice

## 2014-07-26 ENCOUNTER — Inpatient Hospital Stay (HOSPITAL_COMMUNITY): Payer: BLUE CROSS/BLUE SHIELD

## 2014-07-26 DIAGNOSIS — J40 Bronchitis, not specified as acute or chronic: Secondary | ICD-10-CM

## 2014-07-26 DIAGNOSIS — R05 Cough: Secondary | ICD-10-CM

## 2014-07-26 DIAGNOSIS — J9601 Acute respiratory failure with hypoxia: Secondary | ICD-10-CM

## 2014-07-26 DIAGNOSIS — J9602 Acute respiratory failure with hypercapnia: Secondary | ICD-10-CM

## 2014-07-26 LAB — BLOOD GAS, ARTERIAL
ACID-BASE EXCESS: 5.2 mmol/L — AB (ref 0.0–2.0)
BICARBONATE: 28.4 meq/L — AB (ref 20.0–24.0)
Drawn by: 22223
FIO2: 45 %
LHR: 25 {breaths}/min
O2 Saturation: 98.8 %
PATIENT TEMPERATURE: 37
PEEP: 5 cmH2O
PH ART: 7.506 — AB (ref 7.350–7.450)
TCO2: 25.4 mmol/L (ref 0–100)
VT: 450 mL
pCO2 arterial: 36.2 mmHg (ref 35.0–45.0)
pO2, Arterial: 132 mmHg — ABNORMAL HIGH (ref 80.0–100.0)

## 2014-07-26 LAB — COMPREHENSIVE METABOLIC PANEL
ALBUMIN: 2.6 g/dL — AB (ref 3.5–5.2)
ALT: 15 U/L (ref 0–35)
AST: 27 U/L (ref 0–37)
Alkaline Phosphatase: 82 U/L (ref 39–117)
Anion gap: 8 (ref 5–15)
BILIRUBIN TOTAL: 0.9 mg/dL (ref 0.3–1.2)
BUN: 11 mg/dL (ref 6–23)
CO2: 28 mmol/L (ref 19–32)
Calcium: 8 mg/dL — ABNORMAL LOW (ref 8.4–10.5)
Chloride: 105 mmol/L (ref 96–112)
Glucose, Bld: 112 mg/dL — ABNORMAL HIGH (ref 70–99)
POTASSIUM: 2.1 mmol/L — AB (ref 3.5–5.1)
SODIUM: 141 mmol/L (ref 135–145)
Total Protein: 5.5 g/dL — ABNORMAL LOW (ref 6.0–8.3)

## 2014-07-26 LAB — GLUCOSE, CAPILLARY
GLUCOSE-CAPILLARY: 106 mg/dL — AB (ref 70–99)
GLUCOSE-CAPILLARY: 107 mg/dL — AB (ref 70–99)
GLUCOSE-CAPILLARY: 139 mg/dL — AB (ref 70–99)
Glucose-Capillary: 112 mg/dL — ABNORMAL HIGH (ref 70–99)
Glucose-Capillary: 122 mg/dL — ABNORMAL HIGH (ref 70–99)
Glucose-Capillary: 107 mg/dL — ABNORMAL HIGH (ref 70–99)

## 2014-07-26 LAB — POCT I-STAT 3, ART BLOOD GAS (G3+)
ACID-BASE EXCESS: 3 mmol/L — AB (ref 0.0–2.0)
Bicarbonate: 27.9 mEq/L — ABNORMAL HIGH (ref 20.0–24.0)
O2 Saturation: 95 %
PO2 ART: 74 mmHg — AB (ref 80.0–100.0)
TCO2: 29 mmol/L (ref 0–100)
pCO2 arterial: 45 mmHg (ref 35.0–45.0)
pH, Arterial: 7.4 (ref 7.350–7.450)

## 2014-07-26 LAB — CBC
HCT: 33.1 % — ABNORMAL LOW (ref 36.0–46.0)
HEMOGLOBIN: 10.8 g/dL — AB (ref 12.0–15.0)
MCH: 27.9 pg (ref 26.0–34.0)
MCHC: 32.6 g/dL (ref 30.0–36.0)
MCV: 85.5 fL (ref 78.0–100.0)
PLATELETS: 116 10*3/uL — AB (ref 150–400)
RBC: 3.87 MIL/uL (ref 3.87–5.11)
RDW: 13.1 % (ref 11.5–15.5)
WBC: 5.9 10*3/uL (ref 4.0–10.5)

## 2014-07-26 LAB — LEGIONELLA ANTIGEN, URINE

## 2014-07-26 LAB — PHOSPHORUS: Phosphorus: 1.8 mg/dL — ABNORMAL LOW (ref 2.3–4.6)

## 2014-07-26 LAB — MAGNESIUM: MAGNESIUM: 1.8 mg/dL (ref 1.5–2.5)

## 2014-07-26 MED ORDER — CEFTRIAXONE SODIUM IN DEXTROSE 20 MG/ML IV SOLN
1.0000 g | INTRAVENOUS | Status: DC
  Administered 2014-07-26 – 2014-07-27 (×2): 1 g via INTRAVENOUS
  Filled 2014-07-26 (×3): qty 50

## 2014-07-26 MED ORDER — ACETYLCYSTEINE 20 % IN SOLN
4.0000 mL | Freq: Two times a day (BID) | RESPIRATORY_TRACT | Status: AC
  Administered 2014-07-27 – 2014-07-28 (×4): 4 mL via RESPIRATORY_TRACT
  Filled 2014-07-26 (×5): qty 4

## 2014-07-26 MED ORDER — FUROSEMIDE 10 MG/ML IJ SOLN
10.0000 mg | Freq: Two times a day (BID) | INTRAMUSCULAR | Status: DC
  Administered 2014-07-26 – 2014-07-27 (×2): 10 mg via INTRAVENOUS
  Filled 2014-07-26: qty 1
  Filled 2014-07-26: qty 2
  Filled 2014-07-26: qty 1
  Filled 2014-07-26: qty 2

## 2014-07-26 MED ORDER — MAGNESIUM SULFATE 4 GM/100ML IV SOLN
4.0000 g | Freq: Once | INTRAVENOUS | Status: AC
  Administered 2014-07-26: 4 g via INTRAVENOUS
  Filled 2014-07-26: qty 100

## 2014-07-26 MED ORDER — SODIUM PHOSPHATE 3 MMOLE/ML IV SOLN
15.0000 mmol | Freq: Once | INTRAVENOUS | Status: AC
  Administered 2014-07-26: 15 mmol via INTRAVENOUS
  Filled 2014-07-26: qty 5

## 2014-07-26 MED ORDER — BACLOFEN 10 MG PO TABS
40.0000 mg | ORAL_TABLET | Freq: Four times a day (QID) | ORAL | Status: DC
  Administered 2014-07-26 – 2014-08-07 (×37): 40 mg via ORAL
  Filled 2014-07-26 (×6): qty 4
  Filled 2014-07-26: qty 2
  Filled 2014-07-26: qty 4
  Filled 2014-07-26: qty 2
  Filled 2014-07-26 (×19): qty 4
  Filled 2014-07-26: qty 2
  Filled 2014-07-26 (×6): qty 4
  Filled 2014-07-26 (×2): qty 2
  Filled 2014-07-26 (×18): qty 4

## 2014-07-26 MED ORDER — POTASSIUM CHLORIDE 20 MEQ/15ML (10%) PO SOLN
40.0000 meq | ORAL | Status: AC
  Administered 2014-07-26 (×3): 40 meq
  Filled 2014-07-26 (×3): qty 30

## 2014-07-26 MED ORDER — VANCOMYCIN HCL 500 MG IV SOLR
500.0000 mg | Freq: Two times a day (BID) | INTRAVENOUS | Status: DC
  Administered 2014-07-26: 500 mg via INTRAVENOUS
  Filled 2014-07-26 (×3): qty 500

## 2014-07-26 MED ORDER — GABAPENTIN 300 MG PO CAPS
600.0000 mg | ORAL_CAPSULE | Freq: Three times a day (TID) | ORAL | Status: DC
  Administered 2014-07-26 – 2014-08-07 (×37): 600 mg via ORAL
  Filled 2014-07-26 (×39): qty 2

## 2014-07-26 NOTE — Progress Notes (Signed)
Subjective: She remains intubated and on the ventilator. She is awake. She says that the medication list that is in the computer is not totally correct. She is fairly markedly hypokalemic this morning.  Objective: Vital signs in last 24 hours: Temp:  [99.8 F (37.7 C)-102.4 F (39.1 C)] 100.2 F (37.9 C) (02/04 0400) Pulse Rate:  [72-113] 81 (02/04 0600) Resp:  [22-26] 25 (02/04 0600) BP: (87-135)/(55-97) 116/72 mmHg (02/04 0600) SpO2:  [93 %-99 %] 97 % (02/04 0600) FiO2 (%):  [45 %] 45 % (02/04 0328) Weight:  [41.2 kg (90 lb 13.3 oz)] 41.2 kg (90 lb 13.3 oz) (02/04 0500) Weight change: 2.644 kg (5 lb 13.3 oz) Last BM Date: 07/22/14  Intake/Output from previous day: 02/03 0701 - 02/04 0700 In: 3742.5 [I.V.:2982.5; NG/GT:560; IV Piggyback:200] Out: 1906 [Urine:1905; Stool:1]  PHYSICAL EXAM General appearance: alert, cooperative and Intubated and on the ventilator Resp: rhonchi bilaterally Cardio: regular rate and rhythm, S1, S2 normal, no murmur, click, rub or gallop GI: soft, non-tender; bowel sounds normal; no masses,  no organomegaly Extremities: extremities normal, atraumatic, no cyanosis or edema  Lab Results:  Results for orders placed or performed during the hospital encounter of 07/24/14 (from the past 48 hour(s))  CBC with Differential/Platelet     Status: Abnormal   Collection Time: 07/24/14  9:59 AM  Result Value Ref Range   WBC 9.2 4.0 - 10.5 K/uL   RBC 4.60 3.87 - 5.11 MIL/uL   Hemoglobin 12.9 12.0 - 15.0 g/dL   HCT 40.4 36.0 - 46.0 %   MCV 87.8 78.0 - 100.0 fL   MCH 28.0 26.0 - 34.0 pg   MCHC 31.9 30.0 - 36.0 g/dL   RDW 13.0 11.5 - 15.5 %   Platelets 167 150 - 400 K/uL   Neutrophils Relative % 80 (H) 43 - 77 %   Neutro Abs 7.4 1.7 - 7.7 K/uL   Lymphocytes Relative 15 12 - 46 %   Lymphs Abs 1.4 0.7 - 4.0 K/uL   Monocytes Relative 4 3 - 12 %   Monocytes Absolute 0.3 0.1 - 1.0 K/uL   Eosinophils Relative 1 0 - 5 %   Eosinophils Absolute 0.1 0.0 - 0.7 K/uL    Basophils Relative 0 0 - 1 %   Basophils Absolute 0.0 0.0 - 0.1 K/uL  Basic metabolic panel     Status: Abnormal   Collection Time: 07/24/14  9:59 AM  Result Value Ref Range   Sodium 140 135 - 145 mmol/L   Potassium 3.2 (L) 3.5 - 5.1 mmol/L   Chloride 100 96 - 112 mmol/L   CO2 32 19 - 32 mmol/L   Glucose, Bld 111 (H) 70 - 99 mg/dL   BUN 11 6 - 23 mg/dL   Creatinine, Ser <0.30 (L) 0.50 - 1.10 mg/dL   Calcium 8.6 8.4 - 10.5 mg/dL   GFR calc non Af Amer NOT CALCULATED >90 mL/min   GFR calc Af Amer NOT CALCULATED >90 mL/min    Comment: (NOTE) The eGFR has been calculated using the CKD EPI equation. This calculation has not been validated in all clinical situations. eGFR's persistently <90 mL/min signify possible Chronic Kidney Disease.    Anion gap 8 5 - 15  Blood culture (routine x 2)     Status: None (Preliminary result)   Collection Time: 07/24/14  9:59 AM  Result Value Ref Range   Specimen Description BLOOD RIGHT ARM DRAWN BY RN [id number]    Special Requests  BOTTLES DRAWN AEROBIC AND ANAEROBIC AEB=6CC ANA=4CC  IMMUNE:COMPROMISED   Culture NO GROWTH 1 DAY    Report Status PENDING   Magnesium     Status: None   Collection Time: 07/24/14  9:59 AM  Result Value Ref Range   Magnesium 1.7 1.5 - 2.5 mg/dL  I-Stat CG4 Lactic Acid, ED     Status: None   Collection Time: 07/24/14 10:04 AM  Result Value Ref Range   Lactic Acid, Venous 0.83 0.5 - 2.0 mmol/L  Influenza panel by PCR (type A & B, H1N1)     Status: None   Collection Time: 07/24/14  6:15 PM  Result Value Ref Range   Influenza A By PCR NEGATIVE NEGATIVE   Influenza B By PCR NEGATIVE NEGATIVE   H1N1 flu by pcr NOT DETECTED NOT DETECTED    Comment:        The Xpert Flu assay (FDA approved for nasal aspirates or washes and nasopharyngeal swab specimens), is intended as an aid in the diagnosis of influenza and should not be used as a sole basis for treatment.   Blood gas, arterial     Status: Abnormal   Collection  Time: 07/25/14  3:35 AM  Result Value Ref Range   FIO2 0.44 %   Delivery systems NASAL CANNULA    pH, Arterial 7.103 (LL) 7.350 - 7.450    Comment: CRITICAL RESULT CALLED TO, READ BACK BY AND VERIFIED WITH: C.MARTIN,RN BY B.STOPHEL,RRT,RCP ON 07/25/14 CRITICAL RESULT CALLED TO, READ BACK BY AND VERIFIED WITH: C.MARTIN,RN BY B.STOPHEL,RRT,RCP ON 07/25/14 AT 03:42.    pCO2 arterial ABOVE REPORTABLE RANGE 35.0 - 45.0 mmHg    Comment: C.MARTIN,RN BY B.STOPHEL,RRT,RCP ON 07/25/14 AT 03:42.   pO2, Arterial 76.4 (L) 80.0 - 100.0 mmHg   O2 Saturation 91.5 %   Patient temperature 37.0    Collection site RIGHT RADIAL    Drawn by [phone]    Sample type ARTERIAL DRAW    Allens test (pass/fail) PASS PASS  Basic metabolic panel     Status: Abnormal   Collection Time: 07/25/14  5:20 AM  Result Value Ref Range   Sodium 140 135 - 145 mmol/L   Potassium 3.7 3.5 - 5.1 mmol/L   Chloride 105 96 - 112 mmol/L   CO2 29 19 - 32 mmol/L   Glucose, Bld 120 (H) 70 - 99 mg/dL   BUN 7 6 - 23 mg/dL   Creatinine, Ser <0.30 (L) 0.50 - 1.10 mg/dL   Calcium 7.6 (L) 8.4 - 10.5 mg/dL   GFR calc non Af Amer NOT CALCULATED >90 mL/min   GFR calc Af Amer NOT CALCULATED >90 mL/min    Comment: (NOTE) The eGFR has been calculated using the CKD EPI equation. This calculation has not been validated in all clinical situations. eGFR's persistently <90 mL/min signify possible Chronic Kidney Disease.    Anion gap 6 5 - 15  CBC     Status: Abnormal   Collection Time: 07/25/14  5:20 AM  Result Value Ref Range   WBC 6.2 4.0 - 10.5 K/uL   RBC 4.00 3.87 - 5.11 MIL/uL   Hemoglobin 11.2 (L) 12.0 - 15.0 g/dL   HCT 35.9 (L) 36.0 - 46.0 %   MCV 89.8 78.0 - 100.0 fL   MCH 28.0 26.0 - 34.0 pg   MCHC 31.2 30.0 - 36.0 g/dL   RDW 13.5 11.5 - 15.5 %   Platelets 113 (L) 150 - 400 K/uL    Comment: SPECIMEN CHECKED  FOR CLOTS PLATELET COUNT CONFIRMED BY SMEAR   Lactic acid, plasma     Status: None   Collection Time: 07/25/14  5:20 AM   Result Value Ref Range   Lactic Acid, Venous 0.6 0.5 - 2.0 mmol/L  MRSA PCR Screening     Status: None   Collection Time: 07/25/14  5:37 AM  Result Value Ref Range   MRSA by PCR NEGATIVE NEGATIVE    Comment:        The GeneXpert MRSA Assay (FDA approved for NASAL specimens only), is one component of a comprehensive MRSA colonization surveillance program. It is not intended to diagnose MRSA infection nor to guide or monitor treatment for MRSA infections.   Draw ABG 1 hour after initiation of ventilator     Status: Abnormal   Collection Time: 07/25/14  7:23 AM  Result Value Ref Range   FIO2 0.45 %   O2 Content 45.0 L/min   Delivery systems VENTILATOR    Mode PRESSURE REGULATED VOLUME CONTROL    VT 450 mL   Rate 25 resp/min   Peep/cpap 5.0 cm H20   pH, Arterial 7.406 7.350 - 7.450   pCO2 arterial 47.7 (H) 35.0 - 45.0 mmHg   pO2, Arterial 60.4 (L) 80.0 - 100.0 mmHg   Bicarbonate 29.4 (H) 20.0 - 24.0 mEq/L   TCO2 26.4 0 - 100 mmol/L   Acid-Base Excess 4.9 (H) 0.0 - 2.0 mmol/L   O2 Saturation 93.4 %   Patient temperature 37.0    Collection site LEFT RADIAL    Drawn by [phone]    Sample type ARTERIAL    Allens test (pass/fail) PASS PASS  Glucose, capillary     Status: Abnormal   Collection Time: 07/25/14 10:43 AM  Result Value Ref Range   Glucose-Capillary 102 (H) 70 - 99 mg/dL  Glucose, capillary     Status: None   Collection Time: 07/25/14  2:56 PM  Result Value Ref Range   Glucose-Capillary 76 70 - 99 mg/dL  Urinalysis, Routine w reflex microscopic     Status: Abnormal   Collection Time: 07/25/14  3:08 PM  Result Value Ref Range   Color, Urine YELLOW YELLOW   APPearance CLEAR CLEAR   Specific Gravity, Urine 1.025 1.005 - 1.030   pH 5.5 5.0 - 8.0   Glucose, UA NEGATIVE NEGATIVE mg/dL   Hgb urine dipstick TRACE (A) NEGATIVE   Bilirubin Urine NEGATIVE NEGATIVE   Ketones, ur 15 (A) NEGATIVE mg/dL   Protein, ur NEGATIVE NEGATIVE mg/dL   Urobilinogen, UA 0.2 0.0 - 1.0  mg/dL   Nitrite NEGATIVE NEGATIVE   Leukocytes, UA MODERATE (A) NEGATIVE  Strep pneumoniae urinary antigen     Status: None   Collection Time: 07/25/14  3:08 PM  Result Value Ref Range   Strep Pneumo Urinary Antigen NEGATIVE NEGATIVE    Comment: PERFORMED AT [facility]        Infection due to S. pneumoniae cannot be absolutely ruled out since the antigen present may be below the detection limit of the test. Performed at [facility]   Urine microscopic-add on     Status: Abnormal   Collection Time: 07/25/14  3:08 PM  Result Value Ref Range   Squamous Epithelial / LPF FEW (A) RARE   WBC, UA 7-10 <3 WBC/hpf   RBC / HPF 0-2 <3 RBC/hpf   Bacteria, UA FEW (A) RARE  Glucose, capillary     Status: None   Collection Time: 07/25/14  4:52  PM  Result Value Ref Range   Glucose-Capillary 90 70 - 99 mg/dL  Glucose, capillary     Status: Abnormal   Collection Time: 07/25/14  8:17 PM  Result Value Ref Range   Glucose-Capillary 103 (H) 70 - 99 mg/dL  Glucose, capillary     Status: Abnormal   Collection Time: 07/26/14 12:06 AM  Result Value Ref Range   Glucose-Capillary 139 (H) 70 - 99 mg/dL  Glucose, capillary     Status: Abnormal   Collection Time: 07/26/14  3:38 AM  Result Value Ref Range   Glucose-Capillary 112 (H) 70 - 99 mg/dL  Blood gas, arterial     Status: Abnormal   Collection Time: 07/26/14  3:48 AM  Result Value Ref Range   FIO2 45.00 %   Delivery systems VENTILATOR    Mode PRESSURE REGULATED VOLUME CONTROL    VT 450 mL   Rate 25 resp/min   Peep/cpap 5.0 cm H20   pH, Arterial 7.506 (H) 7.350 - 7.450   pCO2 arterial 36.2 35.0 - 45.0 mmHg   pO2, Arterial 132.0 (H) 80.0 - 100.0 mmHg   Bicarbonate 28.4 (H) 20.0 - 24.0 mEq/L   TCO2 25.4 0 - 100 mmol/L   Acid-Base Excess 5.2 (H) 0.0 - 2.0 mmol/L   O2 Saturation 98.8 %   Patient temperature 37.0    Collection site LEFT RADIAL    Drawn by [phone]    Sample type ARTERIAL    Allens test (pass/fail) PASS PASS   Comprehensive metabolic panel     Status: Abnormal   Collection Time: 07/26/14  4:34 AM  Result Value Ref Range   Sodium 141 135 - 145 mmol/L   Potassium 2.1 (LL) 3.5 - 5.1 mmol/L    Comment: CRITICAL RESULT CALLED TO, READ BACK BY AND VERIFIED WITH: BREWER,D AT 5:50AM ON 07/26/14 BY FESTERMAN,C    Chloride 105 96 - 112 mmol/L   CO2 28 19 - 32 mmol/L   Glucose, Bld 112 (H) 70 - 99 mg/dL   BUN 11 6 - 23 mg/dL   Creatinine, Ser <0.30 (L) 0.50 - 1.10 mg/dL   Calcium 8.0 (L) 8.4 - 10.5 mg/dL   Total Protein 5.5 (L) 6.0 - 8.3 g/dL   Albumin 2.6 (L) 3.5 - 5.2 g/dL   AST 27 0 - 37 U/L   ALT 15 0 - 35 U/L   Alkaline Phosphatase 82 39 - 117 U/L   Total Bilirubin 0.9 0.3 - 1.2 mg/dL   GFR calc non Af Amer NOT CALCULATED >90 mL/min   GFR calc Af Amer NOT CALCULATED >90 mL/min    Comment: (NOTE) The eGFR has been calculated using the CKD EPI equation. This calculation has not been validated in all clinical situations. eGFR's persistently <90 mL/min signify possible Chronic Kidney Disease.    Anion gap 8 5 - 15  CBC     Status: Abnormal   Collection Time: 07/26/14  4:34 AM  Result Value Ref Range   WBC 5.9 4.0 - 10.5 K/uL   RBC 3.87 3.87 - 5.11 MIL/uL   Hemoglobin 10.8 (L) 12.0 - 15.0 g/dL   HCT 33.1 (L) 36.0 - 46.0 %   MCV 85.5 78.0 - 100.0 fL   MCH 27.9 26.0 - 34.0 pg   MCHC 32.6 30.0 - 36.0 g/dL   RDW 13.1 11.5 - 15.5 %   Platelets 116 (L) 150 - 400 K/uL    Comment: SPECIMEN CHECKED FOR CLOTS CONSISTENT WITH PREVIOUS RESULT  ABGS  Recent Labs  07/26/14 0348  PHART 7.506*  PO2ART 132.0*  TCO2 25.4  HCO3 28.4*   CULTURES Recent Results (from the past 240 hour(s))  Blood culture (routine x 2)     Status: None (Preliminary result)   Collection Time: 07/24/14  9:59 AM  Result Value Ref Range Status   Specimen Description BLOOD RIGHT ARM DRAWN BY RN [phone]  Final   Special Requests   Final    BOTTLES DRAWN AEROBIC AND ANAEROBIC AEB=6CC ANA=4CC  IMMUNE:COMPROMISED    Culture NO GROWTH 1 DAY  Final   Report Status PENDING  Incomplete  MRSA PCR Screening     Status: None   Collection Time: 07/25/14  5:37 AM  Result Value Ref Range Status   MRSA by PCR NEGATIVE NEGATIVE Final    Comment:        The GeneXpert MRSA Assay (FDA approved for NASAL specimens only), is one component of a comprehensive MRSA colonization surveillance program. It is not intended to diagnose MRSA infection nor to guide or monitor treatment for MRSA infections.    Studies/Results: Ct Angio Chest Pe W/cm &/or Wo Cm  07/24/2014   CLINICAL DATA:  Hypoxia.  EXAM: CT ANGIOGRAPHY CHEST WITH CONTRAST  TECHNIQUE: Multidetector CT imaging of the chest was performed using the standard protocol during bolus administration of intravenous contrast. Multiplanar CT image reconstructions and MIPs were obtained to evaluate the vascular anatomy.  CONTRAST:  44mL OMNIPAQUE IOHEXOL 350 MG/ML SOLN  COMPARISON:  CT scan of March 10, 2014.  FINDINGS: No pneumothorax is noted. No significant pleural effusion is noted. Consolidation is noted in superior segment of left lower lobe consistent with pneumonia or subsegmental atelectasis. Material is noted within the lower lobe bronchus consistent with mucous plug or other inflammatory debris. Soft tissue debris is also noted in several lower lobe bronchi on the right side suggesting aspiration. There is no evidence of thoracic aortic dissection or aneurysm. There is no definite evidence of pulmonary embolus. No mediastinal mass or adenopathy is noted. Visualized portion of upper abdomen appears normal.  Review of the MIP images confirms the above findings.  IMPRESSION: No evidence of pulmonary embolus.  Consolidation of the superior segment of left lower lobe is noted consistent with pneumonia or subsegmental atelectasis. The bronchus in this area appears to be occluded suggesting mucous plug or aspirated debris.  Soft tissue debris is also noted within several  lower lobe bronchi on the right side suggesting aspiration as well.   Electronically Signed   By: Sabino Dick M.D.   On: 07/24/2014 13:33   Dg Chest Port 1 View  07/25/2014   CLINICAL DATA:  Feeding tube /orogastric tube placement. Initial encounter.  EXAM: PORTABLE CHEST - 1 VIEW  COMPARISON:  Earlier the same date.  FINDINGS: 0950 hr. Endotracheal tube is unchanged in the midtrachea. An enteric tube has been placed, projecting below the diaphragm, tip not visualized. The heart size and mediastinal contours are stable. There is mildly improved aeration of the medial left lung base. The right lung is clear. There is no pleural effusion or pneumothorax.  IMPRESSION: Enteric tube projects below the diaphragm, tip not visualized. Improved left lower lobe airspace disease.   Electronically Signed   By: Camie Patience M.D.   On: 07/25/2014 10:16   Portable Chest Xray  07/25/2014   CLINICAL DATA:  Endotracheal tube placement.  EXAM: PORTABLE CHEST - 1 VIEW  COMPARISON:  Radiograph earlier this day at 0338 hr  FINDINGS: Portable AP view at 0515 hr: Placement of endotracheal tube, tip 4.5 cm from the carina. The lungs remain hyperinflated. Consolidation in the medial left lower lobe is again seen. Mild increase in bilateral upper lobe perihilar markings, may reflect cephalization and developing vascular congestion. The heart size is normal. No large pleural effusion or evident pneumothorax.  IMPRESSION: 1. Endotracheal tube 4.5 cm from the carina. 2. Medial left lower lobe consolidation. 3. Increasing perihilar upper lobe markings, may reflect cephalization and developing vascular congestion.   Electronically Signed   By: Rubye Oaks M.D.   On: 07/25/2014 05:32   Dg Chest Port 1 View  07/25/2014   CLINICAL DATA:  Shortness of breath and respiratory distress.  EXAM: PORTABLE CHEST - 1 VIEW  COMPARISON:  Radiographs and CT obtained yesterday.  FINDINGS: The lungs are hyperinflated with underlying emphysema. The  medial left lower lobe consolidation on prior CT is not well defined, however questioned increase in density from prior. No new consolidation in the right lung. The heart size is normal. There is no pulmonary edema. No large pleural effusion or pneumothorax.  IMPRESSION: Medial left lower lobe consolidation, likely mildly increased compared to prior exam. Underlying emphysema again seen.   Electronically Signed   By: Rubye Oaks M.D.   On: 07/25/2014 04:04   Dg Chest Port 1 View  07/24/2014   CLINICAL DATA:  One day history of congestion  EXAM: PORTABLE CHEST - 1 VIEW  COMPARISON:  March 16, 2014  FINDINGS: There is evidence suggesting a degree of underlying emphysematous change. There is no edema or consolidation. Heart size and pulmonary vascularity are normal. No adenopathy. There is wire fixation in the lower cervical spine.  IMPRESSION: Underlying emphysematous change.  No edema or consolidation.   Electronically Signed   By: Bretta Bang M.D.   On: 07/24/2014 10:58    Medications:  Prior to Admission:  Prescriptions prior to admission  Medication Sig Dispense Refill Last Dose  . albuterol (PROVENTIL) (2.5 MG/3ML) 0.083% nebulizer solution Take 2.5 mg by nebulization 4 (four) times daily as needed for wheezing or shortness of breath.   07/24/2014 at Unknown time  . ALPRAZolam (XANAX XR) 0.5 MG 24 hr tablet Take 0.5 mg by mouth at bedtime as needed for sleep.    07/23/2014 at Unknown time  . baclofen (LIORESAL) 20 MG tablet Take 40 mg by mouth 4 (four) times daily.    07/24/2014 at Unknown time  . BELSOMRA 20 MG TABS Take 1 tablet by mouth daily.   07/24/2014 at Unknown time  . gabapentin (NEURONTIN) 600 MG tablet Take 600 mg by mouth 3 (three) times daily.   07/24/2014 at Unknown time  . levalbuterol (XOPENEX) 0.63 MG/3ML nebulizer solution Take 0.63 mg by nebulization.   07/24/2014 at Unknown time  . Multiple Vitamin (MULTIVITAMIN WITH MINERALS) TABS tablet Take 1 tablet by mouth daily.    07/24/2014 at Unknown time  . NUCYNTA 50 MG TABS tablet Take 50 mg by mouth daily as needed for severe pain.    unknown  . temazepam (RESTORIL) 30 MG capsule Take 30 mg by mouth at bedtime as needed for sleep.    07/23/2014 at Unknown time  . amoxicillin-clavulanate (AUGMENTIN) 500-125 MG per tablet Take 1 tablet (500 mg total) by mouth 3 (three) times daily. (Patient not taking: Reported on 07/24/2014) 20 tablet 0 Completed Course at Unknown time   Scheduled: . antiseptic oral rinse  7 mL Mouth Rinse QID  . baclofen  40 mg Oral QID  . chlorhexidine  15 mL Mouth Rinse BID  . enoxaparin (LOVENOX) injection  30 mg Subcutaneous Q24H  . feeding supplement (VITAL HIGH PROTEIN)  1,000 mL Per Tube Q24H  . gabapentin  600 mg Oral TID  . insulin aspart  0-9 Units Subcutaneous Q4H  . levalbuterol  0.63 mg Nebulization Q6H  . pantoprazole (PROTONIX) IV  40 mg Intravenous Daily  . piperacillin-tazobactam (ZOSYN)  IV  3.375 g Intravenous Q8H  . potassium chloride  40 mEq Per Tube Q3H  . sodium chloride  3 mL Intravenous Q12H  . vancomycin  500 mg Intravenous Q24H   Continuous: . sodium chloride 150 mL/hr at 07/26/14 0600   PTE:[id number] **OR** acetaminophen, ALPRAZolam, alum & mag hydroxide-simeth, fentaNYL, ondansetron **OR** ondansetron (ZOFRAN) IV, temazepam  Assesment: She has acute hypoxic and hypercapnic respiratory failure. She has required intubation and mechanical ventilation. She initially was thought to have bronchitis but it looks more like community-acquired pneumonia on follow-up chest x-ray. She is improving. Her oxygenation is better. She still has some secretions. I'm going to attempt weaning today.  She has community-acquired pneumonia on antibiotics and will continue that.  She had systemic inflammatory response syndrome and that has improved  She has hypokalemia and her potassium is being replaced  She has quadriplegia unchanged her respiratory reserve is very limited at  baseline  She has trouble with muscle spasms and with severe insomnia and that is at baseline  She has severe protein calorie malnutrition and  is having nutritional support now Principal Problem:   Acute respiratory failure Active Problems:   Quadriplegia   Protein-calorie malnutrition, severe   Hypokalemia   Hypoxemia   SIRS (systemic inflammatory response syndrome)   Acute respiratory failure with hypoxia   Bronchitis   Cough    Plan: Attempt weaning today. If she is not able to go off the ventilator today she may require transfer to [facility] because we don't have pulmonary available as I will be out of town for 3 days    LOS: 2 days   Valdez Brannan L 07/26/2014, 7:47 AM

## 2014-07-26 NOTE — Progress Notes (Deleted)
CRITICAL VALUE ALERT  Critical value received:  Lactic Acid 2.1  Date of notification:  07-26-14  Time of notification:  2220   Critical value read back:Yes  Nurse who received alert:  Ashley JacobsKasey Moore  MD notified (1st page):  Jodean Limaalph Netty  Time of first page:  2221  No new orders given at this time

## 2014-07-26 NOTE — Progress Notes (Signed)
ANTIBIOTIC CONSULT NOTE - follow up  Pharmacy Consult for vancomycin & Zosyn  Indication: pneumonia  No Known Allergies  Patient Measurements: Height:  (162.6 cm) Weight: 90 lb 13.3 oz (41.2 kg) IBW/kg (Calculated) : 54.7 Actual body weight << than IBW  Vital Signs: Temp: 100.2 F (37.9 C) (02/04 0400) Temp Source: Axillary (02/04 0400) BP: 116/72 mmHg (02/04 0600) Pulse Rate: 81 (02/04 0600) Intake/Output from previous day: 02/03 0701 - 02/04 0700 In: 3742.5 [I.V.:2982.5; NG/GT:560; IV Piggyback:200] Out: 1906 [Urine:1905; Stool:1] Intake/Output from this shift:    Labs:  Recent Labs  07/24/14 0959 07/25/14 0520 07/26/14 0434  WBC 9.2 6.2 5.9  HGB 12.9 11.2* 10.8*  PLT 167 113* 116*  CREATININE <0.30* <0.30* <0.30*   CrCl cannot be calculated (Patient has no serum creatinine result on file.).  Due to long hx of quadriplegia, loss of muscle tissue and resulting decreased SCr, CrCl cannot be calculated.  I/O's not accurate.  Must assume [facility] possible CrCl 40-22ml/min for age, decreased body mass, and medical hx.  Microbiology: Recent Results (from the past 720 hour(s))  Blood culture (routine x 2)     Status: None (Preliminary result)   Collection Time: 07/24/14  9:59 AM  Result Value Ref Range Status   Specimen Description BLOOD RIGHT ARM DRAWN BY RN [phone]  Final   Special Requests   Final    BOTTLES DRAWN AEROBIC AND ANAEROBIC AEB=6CC ANA=4CC  IMMUNE:COMPROMISED   Culture NO GROWTH 1 DAY  Final   Report Status PENDING  Incomplete  MRSA PCR Screening     Status: None   Collection Time: 07/25/14  5:37 AM  Result Value Ref Range Status   MRSA by PCR NEGATIVE NEGATIVE Final    Comment:        The GeneXpert MRSA Assay (FDA approved for NASAL specimens only), is one component of [facility] comprehensive MRSA colonization surveillance program. It is not intended to diagnose MRSA infection nor to guide or monitor treatment for MRSA infections.    Medical  History: Past Medical History  Diagnosis Date  . Quadriplegia 04/03/2013  . Spinal cord injury, C5-C7 04/03/2013  . Anxiety   . Insomnia   . Acute respiratory failure with hypoxia     10/15  . CAP (community acquired pneumonia)   . Pressure ulcer     sacryn 10/15   Medications:  Scheduled:  . antiseptic oral rinse  7 mL Mouth Rinse QID  . baclofen  40 mg Oral QID  . chlorhexidine  15 mL Mouth Rinse BID  . enoxaparin (LOVENOX) injection  30 mg Subcutaneous Q24H  . feeding supplement (VITAL HIGH PROTEIN)  1,000 mL Per Tube Q24H  . gabapentin  600 mg Oral TID  . insulin aspart  0-9 Units Subcutaneous Q4H  . levalbuterol  0.63 mg Nebulization Q6H  . pantoprazole (PROTONIX) IV  40 mg Intravenous Daily  . piperacillin-tazobactam (ZOSYN)  IV  3.375 g Intravenous Q8H  . potassium chloride  40 mEq Per Tube Q3H  . sodium chloride  3 mL Intravenous Q12H  . vancomycin  500 mg Intravenous Q12H   Infusions:  . sodium chloride 150 mL/hr at 07/26/14 0600   PRN: acetaminophen **OR** acetaminophen, ALPRAZolam, alum & mag hydroxide-simeth, fentaNYL, ondansetron **OR** ondansetron (ZOFRAN) IV, temazepam   Assessment & therapy goals: Tm 102.4 today.  Will utilize standard Zosyn regimen for CrCl>25ml/min.  Desire vancomycin trough serum level 15-59mcg/ml.  Will elect to be more aggressive with Vancomycin given fever. Pt remains  intubated. Difficult to accurately assess renal fxn.    Plan:  1.  Zosyn 3.375gm IV q8h (4hr infusions) 2.  Vancomycin 500mg  IV q12h, adjust based on trough level if continues 3.  Monitor for indices of infection and renal function 4.  Will need to measure actual serum trough vancomycin level when clinically appropriate  [name] Andersen, [name] Andersen 07/26/2014,8:01 AM

## 2014-07-26 NOTE — Progress Notes (Signed)
Pt [facility]. Belongings sent with pt's spouse.

## 2014-07-26 NOTE — Progress Notes (Signed)
eLink Physician-Brief Progress Note Patient Name: [name] Andersen DOB: 01/06/1951 MRN: [id number]   Date of Service  07/26/2014  HPI/Events of Note  BMP Latest Ref Rng 07/26/2014 07/25/2014 07/24/2014  Glucose 70 - 99 mg/dL 725(D112(H) 664(Q120(H) 034(V111(H)  BUN 6 - 23 mg/dL 11 7 11   Creatinine 0.50 - 1.10 mg/dL <4.25(Z<0.30(L) <5.63(O<0.30(L) <7.56(E<0.30(L)  Sodium 135 - 145 mmol/L 141 140 140  Potassium 3.5 - 5.1 mmol/L 2.1(LL) 3.7 3.2(L)  Chloride 96 - 112 mmol/L 105 105 100  CO2 19 - 32 mmol/L 28 29 32  Calcium 8.4 - 10.5 mg/dL 8.0(L) 7.6(L) 8.6      eICU Interventions  Will order KCL per tube, and f/u BMET and Mg in PM.     Intervention Category Major Interventions: Other:  Chanta Bauers 07/26/2014, 6:10 AM
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
## 2014-07-27 ENCOUNTER — Encounter (HOSPITAL_COMMUNITY): Payer: Self-pay | Admitting: Emergency Medicine

## 2014-07-27 ENCOUNTER — Inpatient Hospital Stay (HOSPITAL_COMMUNITY): Payer: BLUE CROSS/BLUE SHIELD

## 2014-07-27 DIAGNOSIS — N179 Acute kidney failure, unspecified: Secondary | ICD-10-CM

## 2014-07-27 DIAGNOSIS — G8250 Quadriplegia, unspecified: Secondary | ICD-10-CM

## 2014-07-27 DIAGNOSIS — R0600 Dyspnea, unspecified: Secondary | ICD-10-CM

## 2014-07-27 LAB — POTASSIUM: POTASSIUM: 5.3 mmol/L — AB (ref 3.5–5.1)

## 2014-07-27 LAB — CBC WITH DIFFERENTIAL/PLATELET
BASOS PCT: 0 % (ref 0–1)
Basophils Absolute: 0 10*3/uL (ref 0.0–0.1)
EOS PCT: 0 % (ref 0–5)
Eosinophils Absolute: 0 10*3/uL (ref 0.0–0.7)
HCT: 33.9 % — ABNORMAL LOW (ref 36.0–46.0)
HEMOGLOBIN: 11.5 g/dL — AB (ref 12.0–15.0)
LYMPHS PCT: 8 % — AB (ref 12–46)
Lymphs Abs: 0.5 10*3/uL — ABNORMAL LOW (ref 0.7–4.0)
MCH: 28.4 pg (ref 26.0–34.0)
MCHC: 33.9 g/dL (ref 30.0–36.0)
MCV: 83.7 fL (ref 78.0–100.0)
Monocytes Absolute: 0.2 10*3/uL (ref 0.1–1.0)
Monocytes Relative: 4 % (ref 3–12)
NEUTROS ABS: 5 10*3/uL (ref 1.7–7.7)
NEUTROS PCT: 87 % — AB (ref 43–77)
PLATELETS: 113 10*3/uL — AB (ref 150–400)
RBC: 4.05 MIL/uL (ref 3.87–5.11)
RDW: 13.4 % (ref 11.5–15.5)
WBC: 5.7 10*3/uL (ref 4.0–10.5)

## 2014-07-27 LAB — GLUCOSE, CAPILLARY
GLUCOSE-CAPILLARY: 122 mg/dL — AB (ref 70–99)
GLUCOSE-CAPILLARY: 123 mg/dL — AB (ref 70–99)
GLUCOSE-CAPILLARY: 87 mg/dL (ref 70–99)
Glucose-Capillary: 140 mg/dL — ABNORMAL HIGH (ref 70–99)
Glucose-Capillary: 135 mg/dL — ABNORMAL HIGH (ref 70–99)
Glucose-Capillary: 118 mg/dL — ABNORMAL HIGH (ref 70–99)

## 2014-07-27 LAB — BLOOD GAS, ARTERIAL
ACID-BASE EXCESS: 6.4 mmol/L — AB (ref 0.0–2.0)
BICARBONATE: 30.7 meq/L — AB (ref 20.0–24.0)
Drawn by: 31101
FIO2: 0.4 %
MECHVT: 450 mL
O2 SAT: 98.8 %
PEEP/CPAP: 5 cmH2O
PH ART: 7.431 (ref 7.350–7.450)
Patient temperature: 98.6
RATE: 14 resp/min
TCO2: 32.1 mmol/L (ref 0–100)
pCO2 arterial: 46.9 mmHg — ABNORMAL HIGH (ref 35.0–45.0)
pO2, Arterial: 116 mmHg — ABNORMAL HIGH (ref 80.0–100.0)

## 2014-07-27 LAB — BASIC METABOLIC PANEL
ANION GAP: 8 (ref 5–15)
BUN: 7 mg/dL (ref 6–23)
CHLORIDE: 96 mmol/L (ref 96–112)
CO2: 33 mmol/L — ABNORMAL HIGH (ref 19–32)
Calcium: 8.4 mg/dL (ref 8.4–10.5)
Glucose, Bld: 118 mg/dL — ABNORMAL HIGH (ref 70–99)
POTASSIUM: 4.2 mmol/L (ref 3.5–5.1)
Sodium: 137 mmol/L (ref 135–145)

## 2014-07-27 LAB — COMPREHENSIVE METABOLIC PANEL
ALT: 16 U/L (ref 0–35)
AST: 24 U/L (ref 0–37)
Albumin: 2.5 g/dL — ABNORMAL LOW (ref 3.5–5.2)
Alkaline Phosphatase: 87 U/L (ref 39–117)
Anion gap: 7 (ref 5–15)
BILIRUBIN TOTAL: 0.7 mg/dL (ref 0.3–1.2)
BUN: <5 mg/dL — ABNORMAL LOW (ref 6–23)
CHLORIDE: 97 mmol/L (ref 96–112)
CO2: 33 mmol/L — ABNORMAL HIGH (ref 19–32)
Calcium: 8.1 mg/dL — ABNORMAL LOW (ref 8.4–10.5)
GLUCOSE: 134 mg/dL — AB (ref 70–99)
Potassium: 2.8 mmol/L — ABNORMAL LOW (ref 3.5–5.1)
Sodium: 137 mmol/L (ref 135–145)
Total Protein: 5.5 g/dL — ABNORMAL LOW (ref 6.0–8.3)

## 2014-07-27 MED ORDER — POTASSIUM CHLORIDE 20 MEQ/15ML (10%) PO SOLN
40.0000 meq | ORAL | Status: AC
  Administered 2014-07-27 (×2): 40 meq
  Filled 2014-07-27 (×2): qty 30

## 2014-07-27 NOTE — Progress Notes (Signed)
  Echocardiogram 2D Echocardiogram has been performe   Arvil ChacoFoster, Jahsir Rama 07/27/2014, 1:52 PM

## 2014-07-27 NOTE — Consult Note (Addendum)
WOC wound consult note Reason for Consult: Consult requested for sacrum.  [facility] refer to progress notes on 04/26/14.  Wounds to sacrum have been present for extended period of time.  Husband cares for the wounds when at home but he is not in the room at this time to discuss the plan of care. Pt is very emaciated with protruding sacral bone.  Multiple systemic factors can impair healing, including incontinence of stool periodically which is difficult to keep wounds from becoming soiled due to the close location to rectum. Wound type:  Sacrum stage 3 wound over bone location, 1X.3X.2cm, 100% red and moist, no odor, small amt yellow drainage.  Yellow dry callous surrounding wound edges. Upper sacrum with stage 2 wound; appears to be previous deep tissue injury which has evolved to stage 2 at this time.  1X2X1cm, 100% red dry wound bed with minimal amt yellow drainage, no odor. Pressure Ulcer POA: Yes Dressing procedure/placement/frequency: Foam dressing to protect and promote healing.  Pt is on a Sport low airloss bed to reduce pressure to sites.  Air mattress replacement for bed can be ordered if she transfers out of ICU. Please re-consult if further assistance is needed.  Thank-you,  Cammie Mcgeeawn Lunabella Badgett MSN, RN, CWOCN, [facility]AP, CNS [phone]
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
## 2014-07-27 NOTE — Progress Notes (Signed)
[place] ICU Electrolyte Replacement Protocol  Patient Name: SHAWNE BULOW DOB: 1951/05/31 MRN: [id number]  Date of Service  07/27/2014   HPI/Events of Note    Recent Labs Lab 07/24/14 0959 07/25/14 0520 07/26/14 0434 07/26/14 1854 07/27/14 0232  NA 140 140 141  --  137  K 3.2* 3.7 2.1*  --  2.8*  CL 100 105 105  --  97  CO2 32 29 28  --  33*  GLUCOSE 111* 120* 112*  --  134*  BUN $Re'11 7 11  'lGK$ --  <5*  CREATININE <0.30* <0.30* <0.30*  --  <0.30*  CALCIUM 8.6 7.6* 8.0*  --  8.1*  MG 1.7  --   --  1.8  --   PHOS  --   --   --  1.8*  --     CrCl cannot be calculated (Patient has no serum creatinine result on file.).  Intake/Output      02/04 0701 - 02/05 0700   I.V. (mL/kg) 1710 (42.5)   NG/GT 800   IV Piggyback 555   Total Intake(mL/kg) 3065 (76.2)   Urine (mL/kg/hr) 2675 (2.8)   Total Output 2675   Net +390        - I/O DETAILED x24h    Total I/O In: 593 [I.V.:78; NG/GT:160; IV Piggyback:355] Out: 2100 [Urine:2100] - I/O THIS SHIFT    ASSESSMENT   eICURN Interventions  ICU Electrolyte Replacement Protocol criteria met. Labs Replaced per protocol. MD notified   ASSESSMENT: MAJOR ELECTROLYTE    Lorene Dy 07/27/2014, 6:17 AM
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
## 2014-07-28 ENCOUNTER — Inpatient Hospital Stay (HOSPITAL_COMMUNITY): Payer: BLUE CROSS/BLUE SHIELD

## 2014-07-28 LAB — BLOOD GAS, ARTERIAL
Acid-Base Excess: 10.5 mmol/L — ABNORMAL HIGH (ref 0.0–2.0)
BICARBONATE: 34.9 meq/L — AB (ref 20.0–24.0)
DRAWN BY: 24513
FIO2: 0.4 %
LHR: 14 {breaths}/min
MECHVT: 450 mL
O2 SAT: 93.8 %
PATIENT TEMPERATURE: 100.9
PEEP: 5 cmH2O
PH ART: 7.433 (ref 7.350–7.450)
PO2 ART: 70.7 mmHg — AB (ref 80.0–100.0)
TCO2: 36.5 mmol/L (ref 0–100)
pCO2 arterial: 54 mmHg — ABNORMAL HIGH (ref 35.0–45.0)

## 2014-07-28 LAB — PROCALCITONIN: PROCALCITONIN: 1.51 ng/mL

## 2014-07-28 LAB — CULTURE, RESPIRATORY: Culture: NO GROWTH

## 2014-07-28 LAB — GLUCOSE, CAPILLARY
GLUCOSE-CAPILLARY: 105 mg/dL — AB (ref 70–99)
GLUCOSE-CAPILLARY: 107 mg/dL — AB (ref 70–99)
GLUCOSE-CAPILLARY: 119 mg/dL — AB (ref 70–99)
GLUCOSE-CAPILLARY: 157 mg/dL — AB (ref 70–99)
Glucose-Capillary: 96 mg/dL (ref 70–99)
Glucose-Capillary: 112 mg/dL — ABNORMAL HIGH (ref 70–99)

## 2014-07-28 LAB — BASIC METABOLIC PANEL
Anion gap: 7 (ref 5–15)
BUN: 7 mg/dL (ref 6–23)
CO2: 34 mmol/L — ABNORMAL HIGH (ref 19–32)
Calcium: 8.1 mg/dL — ABNORMAL LOW (ref 8.4–10.5)
Chloride: 97 mmol/L (ref 96–112)
GLUCOSE: 112 mg/dL — AB (ref 70–99)
Potassium: 3.6 mmol/L (ref 3.5–5.1)
SODIUM: 138 mmol/L (ref 135–145)

## 2014-07-28 LAB — CULTURE, RESPIRATORY W GRAM STAIN

## 2014-07-28 MED ORDER — PIPERACILLIN-TAZOBACTAM 3.375 G IVPB
3.3750 g | Freq: Three times a day (TID) | INTRAVENOUS | Status: AC
  Administered 2014-07-28 – 2014-07-30 (×8): 3.375 g via INTRAVENOUS
  Filled 2014-07-28 (×8): qty 50

## 2014-07-28 MED ORDER — POTASSIUM CHLORIDE 20 MEQ/15ML (10%) PO SOLN
40.0000 meq | Freq: Once | ORAL | Status: AC
  Administered 2014-07-28: 40 meq
  Filled 2014-07-28: qty 30

## 2014-07-28 NOTE — Progress Notes (Signed)
ANTIBIOTIC CONSULT NOTE - INITIAL  Pharmacy Consult for zosyn Indication: rule out pneumonia  No Known Allergies  Patient Measurements: Height: 5\' 4"  (162.6 cm) Weight: 85 lb 15.7 oz (39 kg) IBW/kg (Calculated) : 54.7  Vital Signs: Temp: 99.2 F (37.3 C) (02/06 0744) Temp Source: Oral (02/06 0744) BP: 91/68 mmHg (02/06 0950) Pulse Rate: 100 (02/06 0950) Intake/Output from previous day: 02/05 0701 - 02/06 0700 In: 1739 [I.V.:139; NG/GT:1100; IV Piggyback:50] Out: 1935 [Urine:1935] Intake/Output from this shift: Total I/O In: 50 [I.V.:10; NG/GT:40] Out: 45 [Urine:45]  Labs:  Recent Labs  07/26/14 0434 07/27/14 0232 07/27/14 1939 07/28/14 0243  WBC 5.9 5.7  --   --   HGB 10.8* 11.5*  --   --   PLT 116* 113*  --   --   CREATININE <0.30* <0.30* <0.30* <0.30*   CrCl cannot be calculated (Patient has no serum creatinine result on file.). No results for input(s): VANCOTROUGH, VANCOPEAK, VANCORANDOM, GENTTROUGH, GENTPEAK, GENTRANDOM, TOBRATROUGH, TOBRAPEAK, TOBRARND, AMIKACINPEAK, AMIKACINTROU, AMIKACIN in the last 72 hours.   Microbiology: Recent Results (from the past 720 hour(s))  Blood culture (routine x 2)     Status: None (Preliminary result)   Collection Time: 07/24/14  9:59 AM  Result Value Ref Range Status   Specimen Description BLOOD RIGHT ARM DRAWN BY RN [phone]  Final   Special Requests   Final    BOTTLES DRAWN AEROBIC AND ANAEROBIC AEB=6CC ANA=4CC  IMMUNE:COMPROMISED   Culture NO GROWTH 4 DAYS  Final   Report Status PENDING  Incomplete  MRSA PCR Screening     Status: None   Collection Time: 07/25/14  5:37 AM  Result Value Ref Range Status   MRSA by PCR NEGATIVE NEGATIVE Final    Comment:        The GeneXpert MRSA Assay (FDA approved for NASAL specimens only), is one component of a comprehensive MRSA colonization surveillance program. It is not intended to diagnose MRSA infection nor to guide or monitor treatment for MRSA infections.   Culture,  respiratory (NON-Expectorated)     Status: None (Preliminary result)   Collection Time: 07/25/14 11:30 AM  Result Value Ref Range Status   Specimen Description TRACHEAL ASPIRATE  Final   Special Requests NONE  Final   Gram Stain   Final    MODERATE WBC PRESENT,BOTH PMN AND MONONUCLEAR NO SQUAMOUS EPITHELIAL CELLS SEEN NO ORGANISMS SEEN Performed at [facility]    Culture   Final    NO GROWTH 1 DAY Performed at [facility]    Report Status PENDING  Incomplete  Culture, blood (routine x 2)     Status: None (Preliminary result)   Collection Time: 07/26/14  7:07 PM  Result Value Ref Range Status   Specimen Description BLOOD LEFT HAND  Final   Special Requests BOTTLES DRAWN AEROBIC AND ANAEROBIC 5CC  Final   Culture   Final           BLOOD CULTURE RECEIVED NO GROWTH TO DATE CULTURE WILL BE HELD FOR 5 DAYS BEFORE ISSUING A FINAL NEGATIVE REPORT Performed at [facility]    Report Status PENDING  Incomplete  Culture, blood (routine x 2)     Status: None (Preliminary result)   Collection Time: 07/26/14  7:22 PM  Result Value Ref Range Status   Specimen Description BLOOD LEFT HAND  Final   Special Requests   Final    BOTTLES DRAWN AEROBIC AND ANAEROBIC 5CC AER,4CC ANA   Culture  Final           BLOOD CULTURE RECEIVED NO GROWTH TO DATE CULTURE WILL BE HELD FOR 5 DAYS BEFORE ISSUING A FINAL NEGATIVE REPORT Performed at [facility]    Report Status PENDING  Incomplete   Medications:  Anti-infectives    Start     Dose/Rate Route Frequency Ordered Stop   07/28/14 1000  piperacillin-tazobactam (ZOSYN) IVPB 3.375 g     3.375 g12.5 mL/hr over 240 Minutes Intravenous Every 8 hours 07/28/14 0924     07/26/14 1900  cefTRIAXone (ROCEPHIN) 1 g in dextrose 5 % 50 mL IVPB - Premix  Status:  Discontinued     1 g100 mL/hr over 30 Minutes Intravenous Every 24 hours 07/26/14 1745 07/28/14 0922   07/26/14 0800  vancomycin (VANCOCIN) 500 mg in sodium chloride 0.9 %  100 mL IVPB  Status:  Discontinued     500 mg100 mL/hr over 60 Minutes Intravenous Every 24 hours 07/25/14 0520 07/26/14 0759   07/26/14 0800  vancomycin (VANCOCIN) 500 mg in sodium chloride 0.9 % 100 mL IVPB  Status:  Discontinued     500 mg100 mL/hr over 60 Minutes Intravenous Every 12 hours 07/26/14 0759 07/26/14 1745   07/25/14 1200  cefTRIAXone (ROCEPHIN) 1 g in dextrose 5 % 50 mL IVPB - Premix  Status:  Discontinued     1 g100 mL/hr over 30 Minutes Intravenous Every 24 hours 07/24/14 1630 07/25/14 0505   07/25/14 1100  azithromycin (ZITHROMAX) 500 mg in dextrose 5 % 250 mL IVPB  Status:  Discontinued     500 mg250 mL/hr over 60 Minutes Intravenous Every 24 hours 07/24/14 1603 07/25/14 0820   07/25/14 0630  vancomycin (VANCOCIN) IVPB 750 mg/150 ml premix     750 mg150 mL/hr over 60 Minutes Intravenous  Once 07/25/14 0520 07/25/14 0707   07/25/14 0600  piperacillin-tazobactam (ZOSYN) IVPB 3.375 g  Status:  Discontinued     3.375 g12.5 mL/hr over 240 Minutes Intravenous Every 8 hours 07/25/14 0519 07/26/14 1745   07/24/14 1045  cefTRIAXone (ROCEPHIN) 1 g in dextrose 5 % 50 mL IVPB     1 g100 mL/hr over 30 Minutes Intravenous  Once 07/24/14 1031 07/24/14 1243   07/24/14 1045  azithromycin (ZITHROMAX) 500 mg in dextrose 5 % 250 mL IVPB     500 mg250 mL/hr over 60 Minutes Intravenous  Once 07/24/14 1031 07/24/14 1141     Assessment: 63 yof presented to the hospital with SOB eventually requiring intubation. Started on broad-spectrum antibiotics then narrowed to ceftriaxone. Pt has continued fevers so broadening back up to zosyn today. Tmax is 100.8 and WBC is WNL as of 2/5. Scr is falsely low due to history of quadriplegia.   Vanc 2/3>>2/4 Zosyn 2/3 >>2/4; 2/6 CTX 2/2 >>2/2; 2/4 >>2/6 Azith 2/2 >>2/2  2/2 Blood >>ngtd 2/3 Trach asp >>NGTD 2/4 Blood >>ngtd  Goal of Therapy:  Eradication of infection  Plan:  1. Zosyn 3.375gm IV Q8H (4 hr inf) 2. F/u renal fxn, C&S, clinical  status  [name] Andersen, [name] Andersen 07/28/2014,11:04 AM
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
## 2014-07-29 ENCOUNTER — Inpatient Hospital Stay (HOSPITAL_COMMUNITY): Payer: BLUE CROSS/BLUE SHIELD

## 2014-07-29 LAB — CULTURE, BLOOD (ROUTINE X 2): Culture: NO GROWTH

## 2014-07-29 LAB — BLOOD GAS, ARTERIAL
Acid-Base Excess: 8.7 mmol/L — ABNORMAL HIGH (ref 0.0–2.0)
Bicarbonate: 33.1 mEq/L — ABNORMAL HIGH (ref 20.0–24.0)
DRAWN BY: 347621
FIO2: 0.4 %
MECHVT: 450 mL
O2 Saturation: 90.5 %
PCO2 ART: 47.7 mmHg — AB (ref 35.0–45.0)
PEEP: 5 cmH2O
PH ART: 7.452 — AB (ref 7.350–7.450)
Patient temperature: 97.5
RATE: 14 resp/min
TCO2: 34.6 mmol/L (ref 0–100)
pO2, Arterial: 55.9 mmHg — ABNORMAL LOW (ref 80.0–100.0)

## 2014-07-29 LAB — CLOSTRIDIUM DIFFICILE BY PCR: Toxigenic C. Difficile by PCR: NEGATIVE

## 2014-07-29 LAB — CBC WITH DIFFERENTIAL/PLATELET
BASOS PCT: 0 % (ref 0–1)
Basophils Absolute: 0 10*3/uL (ref 0.0–0.1)
EOS PCT: 0 % (ref 0–5)
Eosinophils Absolute: 0 10*3/uL (ref 0.0–0.7)
HEMATOCRIT: 34.1 % — AB (ref 36.0–46.0)
HEMOGLOBIN: 11 g/dL — AB (ref 12.0–15.0)
Lymphocytes Relative: 14 % (ref 12–46)
Lymphs Abs: 0.6 10*3/uL — ABNORMAL LOW (ref 0.7–4.0)
MCH: 27.6 pg (ref 26.0–34.0)
MCHC: 32.3 g/dL (ref 30.0–36.0)
MCV: 85.7 fL (ref 78.0–100.0)
MONO ABS: 0.3 10*3/uL (ref 0.1–1.0)
MONOS PCT: 8 % (ref 3–12)
Neutro Abs: 3.1 10*3/uL (ref 1.7–7.7)
Neutrophils Relative %: 78 % — ABNORMAL HIGH (ref 43–77)
Platelets: 134 10*3/uL — ABNORMAL LOW (ref 150–400)
RBC: 3.98 MIL/uL (ref 3.87–5.11)
RDW: 13.7 % (ref 11.5–15.5)
WBC: 4 10*3/uL (ref 4.0–10.5)

## 2014-07-29 LAB — GLUCOSE, CAPILLARY
GLUCOSE-CAPILLARY: 108 mg/dL — AB (ref 70–99)
GLUCOSE-CAPILLARY: 118 mg/dL — AB (ref 70–99)
Glucose-Capillary: 99 mg/dL (ref 70–99)
Glucose-Capillary: 122 mg/dL — ABNORMAL HIGH (ref 70–99)
Glucose-Capillary: 117 mg/dL — ABNORMAL HIGH (ref 70–99)

## 2014-07-29 LAB — BASIC METABOLIC PANEL
ANION GAP: 5 (ref 5–15)
BUN: 11 mg/dL (ref 6–23)
CALCIUM: 8.4 mg/dL (ref 8.4–10.5)
CO2: 38 mmol/L — ABNORMAL HIGH (ref 19–32)
Chloride: 99 mmol/L (ref 96–112)
Creatinine, Ser: <0.3 mg/dL — ABNORMAL LOW (ref 0.50–1.10)
Glucose, Bld: 123 mg/dL — ABNORMAL HIGH (ref 70–99)
POTASSIUM: 3.1 mmol/L — AB (ref 3.5–5.1)
SODIUM: 142 mmol/L (ref 135–145)

## 2014-07-29 LAB — PROCALCITONIN: Procalcitonin: 0.78 ng/mL

## 2014-07-29 LAB — MAGNESIUM: Magnesium: 1.8 mg/dL (ref 1.5–2.5)

## 2014-07-29 LAB — PHOSPHORUS: Phosphorus: 3.6 mg/dL (ref 2.3–4.6)

## 2014-07-29 MED ORDER — LEVALBUTEROL HCL 0.63 MG/3ML IN NEBU
0.6300 mg | INHALATION_SOLUTION | Freq: Four times a day (QID) | RESPIRATORY_TRACT | Status: DC | PRN
  Administered 2014-08-04 – 2014-08-06 (×2): 0.63 mg via RESPIRATORY_TRACT
  Filled 2014-07-29: qty 3

## 2014-07-29 MED ORDER — POTASSIUM CHLORIDE 20 MEQ/15ML (10%) PO SOLN
40.0000 meq | ORAL | Status: AC
  Administered 2014-07-29 (×2): 40 meq
  Filled 2014-07-29 (×2): qty 30

## 2014-07-29 MED ORDER — MAGNESIUM SULFATE 2 GM/50ML IV SOLN
2.0000 g | Freq: Once | INTRAVENOUS | Status: AC
  Administered 2014-07-29: 2 g via INTRAVENOUS
  Filled 2014-07-29: qty 50

## 2014-07-29 NOTE — Progress Notes (Signed)
eLink Physician-Brief Progress Note Patient Name: [name] GensJane M Uno DOB: 12/27/1950 MRN: [id number]   Date of Service  07/29/2014  HPI/Events of Note  Hypokalemia  eICU Interventions  Potassium replaced     Intervention Category Intermediate Interventions: Electrolyte abnormality - evaluation and management  DETERDING,ELIZABETH 07/29/2014, 6:13 AM
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
## 2014-07-30 ENCOUNTER — Inpatient Hospital Stay (HOSPITAL_COMMUNITY): Payer: BLUE CROSS/BLUE SHIELD

## 2014-07-30 LAB — MAGNESIUM: MAGNESIUM: 2.2 mg/dL (ref 1.5–2.5)

## 2014-07-30 LAB — CBC WITH DIFFERENTIAL/PLATELET
Basophils Absolute: 0 10*3/uL (ref 0.0–0.1)
Basophils Relative: 0 % (ref 0–1)
Eosinophils Absolute: 0.1 10*3/uL (ref 0.0–0.7)
Eosinophils Relative: 2 % (ref 0–5)
HCT: 34.7 % — ABNORMAL LOW (ref 36.0–46.0)
Hemoglobin: 11 g/dL — ABNORMAL LOW (ref 12.0–15.0)
Lymphocytes Relative: 15 % (ref 12–46)
Lymphs Abs: 0.6 10*3/uL — ABNORMAL LOW (ref 0.7–4.0)
MCH: 28.1 pg (ref 26.0–34.0)
MCHC: 31.7 g/dL (ref 30.0–36.0)
MCV: 88.5 fL (ref 78.0–100.0)
Monocytes Absolute: 0.4 10*3/uL (ref 0.1–1.0)
Monocytes Relative: 10 % (ref 3–12)
NEUTROS PCT: 73 % (ref 43–77)
Neutro Abs: 2.8 10*3/uL (ref 1.7–7.7)
Platelets: 148 10*3/uL — ABNORMAL LOW (ref 150–400)
RBC: 3.92 MIL/uL (ref 3.87–5.11)
RDW: 13.7 % (ref 11.5–15.5)
WBC: 3.8 10*3/uL — ABNORMAL LOW (ref 4.0–10.5)

## 2014-07-30 LAB — BLOOD GAS, ARTERIAL
Acid-Base Excess: 8.3 mmol/L — ABNORMAL HIGH (ref 0.0–2.0)
BICARBONATE: 33.7 meq/L — AB (ref 20.0–24.0)
Drawn by: 24513
FIO2: 40 %
MECHVT: 450 mL
O2 SAT: 95.3 %
PCO2 ART: 60.5 mmHg — AB (ref 35.0–45.0)
PEEP: 5 cmH2O
Patient temperature: 98.6
RATE: 14 resp/min
TCO2: 35.6 mmol/L (ref 0–100)
pH, Arterial: 7.365 (ref 7.350–7.450)
pO2, Arterial: 87.3 mmHg (ref 80.0–100.0)

## 2014-07-30 LAB — BASIC METABOLIC PANEL
Anion gap: 8 (ref 5–15)
BUN: 16 mg/dL (ref 6–23)
CALCIUM: 8.5 mg/dL (ref 8.4–10.5)
CHLORIDE: 99 mmol/L (ref 96–112)
CO2: 31 mmol/L (ref 19–32)
Creatinine, Ser: <0.3 mg/dL — ABNORMAL LOW (ref 0.50–1.10)
Glucose, Bld: 113 mg/dL — ABNORMAL HIGH (ref 70–99)
POTASSIUM: 3.4 mmol/L — AB (ref 3.5–5.1)
Sodium: 138 mmol/L (ref 135–145)

## 2014-07-30 LAB — GLUCOSE, CAPILLARY
GLUCOSE-CAPILLARY: 110 mg/dL — AB (ref 70–99)
GLUCOSE-CAPILLARY: 125 mg/dL — AB (ref 70–99)

## 2014-07-30 LAB — PHOSPHORUS: PHOSPHORUS: 3.4 mg/dL (ref 2.3–4.6)

## 2014-07-30 LAB — PROCALCITONIN: Procalcitonin: 0.44 ng/mL

## 2014-07-30 MED ORDER — FAMOTIDINE IN NACL 20-0.9 MG/50ML-% IV SOLN
20.0000 mg | INTRAVENOUS | Status: DC
  Filled 2014-07-30: qty 50

## 2014-07-30 MED ORDER — TEMAZEPAM 15 MG PO CAPS
30.0000 mg | ORAL_CAPSULE | Freq: Every evening | ORAL | Status: DC | PRN
  Administered 2014-07-30 – 2014-08-06 (×8): 30 mg via ORAL
  Filled 2014-07-30 (×8): qty 2

## 2014-07-30 MED ORDER — POTASSIUM CHLORIDE 20 MEQ/15ML (10%) PO SOLN
40.0000 meq | Freq: Once | ORAL | Status: AC
  Administered 2014-07-30: 40 meq
  Filled 2014-07-30 (×2): qty 30

## 2014-07-30 MED ORDER — FAMOTIDINE IN NACL 20-0.9 MG/50ML-% IV SOLN
20.0000 mg | INTRAVENOUS | Status: DC
  Administered 2014-07-31: 20 mg via INTRAVENOUS
  Filled 2014-07-30: qty 50

## 2014-07-30 MED ORDER — POTASSIUM CHLORIDE CRYS ER 20 MEQ PO TBCR: 40.0000 meq | EXTENDED_RELEASE_TABLET | Freq: Once | ORAL | Status: DC

## 2014-07-30 MED ORDER — ALPRAZOLAM ER 0.5 MG PO TB24
0.5000 mg | ORAL_TABLET | Freq: Every evening | ORAL | Status: DC | PRN
  Administered 2014-07-30 – 2014-08-03 (×2): 0.5 mg via ORAL
  Filled 2014-07-30 (×3): qty 1

## 2014-07-30 MED ORDER — CHLORHEXIDINE GLUCONATE 0.12 % MT SOLN
15.0000 mL | Freq: Two times a day (BID) | OROMUCOSAL | Status: DC
  Administered 2014-07-31 – 2014-08-05 (×5): 15 mL via OROMUCOSAL
  Filled 2014-07-30 (×14): qty 15

## 2014-07-30 MED ORDER — CETYLPYRIDINIUM CHLORIDE 0.05 % MT LIQD
7.0000 mL | Freq: Two times a day (BID) | OROMUCOSAL | Status: DC
  Administered 2014-07-30 – 2014-08-01 (×6): 7 mL via OROMUCOSAL

## 2014-07-30 NOTE — Procedures (Signed)
CPT not performed on pt due to pain state by pain in "tail bone".

## 2014-07-30 NOTE — Progress Notes (Signed)
CRITICAL VALUE ALERT  Critical value received:  co2 60  Date of notification:  07/30/14  Time of notification:  0340  Critical value read back: yes  Nurse who received alert:  Dalaina Tates rn  Responding MD: elink  Time MD responded:  [phone]

## 2014-07-30 NOTE — Progress Notes (Signed)
Refusing CPT due to pain.
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
## 2014-07-30 NOTE — Procedures (Signed)
Extubation Procedure Note  Patient Details:   Name: Noe GensJane M Cranmer DOB: 08/30/1950 MRN: [id number]   Airway Documentation:     Evaluation  O2 sats: stable throughout Complications: No apparent complications Patient did tolerate procedure well. Bilateral Breath Sounds: Clear, Diminished Suctioning: Oral, Airway Yes   Pt tolerated wean, extubated to [facility]. No stridor or dyspnea noted after extubation. Pt resting comfortably at this time. RT will continue to monitor.   Armando GangMike, Lindzey Zent C 07/30/2014, 3:05 PM

## 2014-07-31 ENCOUNTER — Encounter: Payer: Self-pay | Admitting: Women's Health

## 2014-07-31 ENCOUNTER — Ambulatory Visit: Payer: Self-pay | Admitting: Women's Health

## 2014-07-31 DIAGNOSIS — Z66 Do not resuscitate: Secondary | ICD-10-CM | POA: Diagnosis not present

## 2014-07-31 LAB — CBC WITH DIFFERENTIAL/PLATELET
BASOS ABS: 0 10*3/uL (ref 0.0–0.1)
Basophils Relative: 0 % (ref 0–1)
EOS PCT: 2 % (ref 0–5)
Eosinophils Absolute: 0.1 10*3/uL (ref 0.0–0.7)
HEMATOCRIT: 35.6 % — AB (ref 36.0–46.0)
HEMOGLOBIN: 11.2 g/dL — AB (ref 12.0–15.0)
Lymphocytes Relative: 20 % (ref 12–46)
Lymphs Abs: 0.9 10*3/uL (ref 0.7–4.0)
MCH: 27.7 pg (ref 26.0–34.0)
MCHC: 31.5 g/dL (ref 30.0–36.0)
MCV: 87.9 fL (ref 78.0–100.0)
MONO ABS: 0.4 10*3/uL (ref 0.1–1.0)
MONOS PCT: 8 % (ref 3–12)
NEUTROS ABS: 3.1 10*3/uL (ref 1.7–7.7)
Neutrophils Relative %: 70 % (ref 43–77)
Platelets: 181 10*3/uL (ref 150–400)
RBC: 4.05 MIL/uL (ref 3.87–5.11)
RDW: 13.5 % (ref 11.5–15.5)
WBC: 4.5 10*3/uL (ref 4.0–10.5)

## 2014-07-31 LAB — BASIC METABOLIC PANEL
ANION GAP: 13 (ref 5–15)
BUN: 17 mg/dL (ref 6–23)
CALCIUM: 8.8 mg/dL (ref 8.4–10.5)
CHLORIDE: 98 mmol/L (ref 96–112)
CO2: 28 mmol/L (ref 19–32)
GLUCOSE: 92 mg/dL (ref 70–99)
Potassium: 3.8 mmol/L (ref 3.5–5.1)
Sodium: 139 mmol/L (ref 135–145)

## 2014-07-31 LAB — GLUCOSE, CAPILLARY
GLUCOSE-CAPILLARY: 110 mg/dL — AB (ref 70–99)
Glucose-Capillary: 99 mg/dL (ref 70–99)
Glucose-Capillary: 91 mg/dL (ref 70–99)
Glucose-Capillary: 95 mg/dL (ref 70–99)
Glucose-Capillary: 76 mg/dL (ref 70–99)
Glucose-Capillary: 143 mg/dL — ABNORMAL HIGH (ref 70–99)
Glucose-Capillary: 59 mg/dL — ABNORMAL LOW (ref 70–99)
Glucose-Capillary: 90 mg/dL (ref 70–99)

## 2014-07-31 LAB — PHOSPHORUS: PHOSPHORUS: 3.5 mg/dL (ref 2.3–4.6)

## 2014-07-31 LAB — MAGNESIUM: MAGNESIUM: 1.9 mg/dL (ref 1.5–2.5)

## 2014-07-31 MED ORDER — SUVOREXANT 20 MG PO TABS
0.5000 | ORAL_TABLET | Freq: Every day | ORAL | Status: DC
  Administered 2014-07-31 – 2014-08-06 (×7): 0.5 via ORAL
  Filled 2014-07-31: qty 0.5

## 2014-07-31 MED ORDER — ENSURE COMPLETE PO LIQD
237.0000 mL | Freq: Two times a day (BID) | ORAL | Status: DC
  Administered 2014-08-01 – 2014-08-07 (×5): 237 mL via ORAL

## 2014-07-31 MED ORDER — DEXTROSE 50 % IV SOLN
25.0000 mL | Freq: Once | INTRAVENOUS | Status: AC
  Administered 2014-07-31: 25 mL via INTRAVENOUS

## 2014-07-31 MED ORDER — MAGNESIUM SULFATE IN D5W 10-5 MG/ML-% IV SOLN
1.0000 g | Freq: Once | INTRAVENOUS | Status: AC
  Administered 2014-07-31: 1 g via INTRAVENOUS
  Filled 2014-07-31 (×2): qty 100

## 2014-07-31 MED ORDER — DEXTROSE 50 % IV SOLN
INTRAVENOUS | Status: AC
  Administered 2014-07-31: 01:00:00
  Filled 2014-07-31: qty 50

## 2014-07-31 MED ORDER — DEXTROSE-NACL 5-0.45 % IV SOLN
INTRAVENOUS | Status: DC
  Administered 2014-07-31: 10 mL/h via INTRAVENOUS
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
## 2014-07-31 NOTE — Progress Notes (Signed)
Notified pt CBG 59. Hypogylcemia protocol initiated.. 4 oz OJ given. CBG rechecked [name] Andersen, [name] Andersen

## 2014-07-31 NOTE — Progress Notes (Signed)
SLP Cancellation Note  Patient Details Name: [name] Andersen MRN: [id number] DOB: 10/02/1950   Cancelled evaluation:      Per RN and pt, pt is swallowing without difficulty. No SLP swallow eval warranted.  Will sign off     Blenda MountsCouture, Helana Macbride Laurice 07/31/2014, 3:00 PM
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
## 2014-07-31 NOTE — Progress Notes (Signed)
Patient refused CPT due to pain.  Said she would do tomorrow when she is able to sit in chair.

## 2014-07-31 NOTE — Progress Notes (Addendum)
Paged Dr. Vassie LollAlva with Critical care who gave verbal order for patient's belsomra 20mg  PO. Patient states she only takes half a tablet. MD aware.   Lauren in pharmacy notified.

## 2014-07-31 NOTE — Progress Notes (Signed)
Pt. Refused 00:00 CPT.

## 2014-07-31 NOTE — Plan of Care (Signed)
Problem: ICU Phase Progression Outcomes Goal: Voiding-avoid urinary catheter unless indicated Outcome: Not Applicable Date Met:  78/00/44 In & out cath at home, quad

## 2014-07-31 NOTE — Progress Notes (Signed)
Patient's belsomra 20mg  PO taken to pharmacy. Asher MuirJamie, pharm stated medication would be loaded into pyxis.

## 2014-08-01 DIAGNOSIS — E876 Hypokalemia: Secondary | ICD-10-CM

## 2014-08-01 DIAGNOSIS — E43 Unspecified severe protein-calorie malnutrition: Secondary | ICD-10-CM

## 2014-08-01 DIAGNOSIS — A419 Sepsis, unspecified organism: Secondary | ICD-10-CM

## 2014-08-01 LAB — CBC WITH DIFFERENTIAL/PLATELET
BASOS ABS: 0 10*3/uL (ref 0.0–0.1)
BASOS PCT: 0 % (ref 0–1)
EOS PCT: 5 % (ref 0–5)
Eosinophils Absolute: 0.2 10*3/uL (ref 0.0–0.7)
HCT: 35.4 % — ABNORMAL LOW (ref 36.0–46.0)
Hemoglobin: 11.2 g/dL — ABNORMAL LOW (ref 12.0–15.0)
Lymphocytes Relative: 24 % (ref 12–46)
Lymphs Abs: 0.8 10*3/uL (ref 0.7–4.0)
MCH: 28.1 pg (ref 26.0–34.0)
MCHC: 31.6 g/dL (ref 30.0–36.0)
MCV: 88.9 fL (ref 78.0–100.0)
MONOS PCT: 10 % (ref 3–12)
Monocytes Absolute: 0.3 10*3/uL (ref 0.1–1.0)
NEUTROS ABS: 2 10*3/uL (ref 1.7–7.7)
NEUTROS PCT: 61 % (ref 43–77)
PLATELETS: 200 10*3/uL (ref 150–400)
RBC: 3.98 MIL/uL (ref 3.87–5.11)
RDW: 13.2 % (ref 11.5–15.5)
WBC: 3.3 10*3/uL — ABNORMAL LOW (ref 4.0–10.5)

## 2014-08-01 LAB — BASIC METABOLIC PANEL
ANION GAP: 10 (ref 5–15)
BUN: 6 mg/dL (ref 6–23)
CHLORIDE: 98 mmol/L (ref 96–112)
CO2: 33 mmol/L — ABNORMAL HIGH (ref 19–32)
Calcium: 8.6 mg/dL (ref 8.4–10.5)
Creatinine, Ser: <0.3 mg/dL — ABNORMAL LOW (ref 0.50–1.10)
Glucose, Bld: 86 mg/dL (ref 70–99)
POTASSIUM: 3.7 mmol/L (ref 3.5–5.1)
SODIUM: 141 mmol/L (ref 135–145)

## 2014-08-01 LAB — GLUCOSE, CAPILLARY
GLUCOSE-CAPILLARY: 101 mg/dL — AB (ref 70–99)
GLUCOSE-CAPILLARY: 160 mg/dL — AB (ref 70–99)
Glucose-Capillary: 153 mg/dL — ABNORMAL HIGH (ref 70–99)
Glucose-Capillary: 165 mg/dL — ABNORMAL HIGH (ref 70–99)
Glucose-Capillary: 168 mg/dL — ABNORMAL HIGH (ref 70–99)
Glucose-Capillary: 53 mg/dL — ABNORMAL LOW (ref 70–99)
Glucose-Capillary: 69 mg/dL — ABNORMAL LOW (ref 70–99)
Glucose-Capillary: 72 mg/dL (ref 70–99)
Glucose-Capillary: 80 mg/dL (ref 70–99)
Glucose-Capillary: 88 mg/dL (ref 70–99)
Glucose-Capillary: 92 mg/dL (ref 70–99)
Glucose-Capillary: 94 mg/dL (ref 70–99)

## 2014-08-01 LAB — PHOSPHORUS: PHOSPHORUS: 3.7 mg/dL (ref 2.3–4.6)

## 2014-08-01 LAB — MAGNESIUM: Magnesium: 1.9 mg/dL (ref 1.5–2.5)

## 2014-08-01 MED ORDER — LOPERAMIDE HCL 2 MG PO CAPS
2.0000 mg | ORAL_CAPSULE | Freq: Three times a day (TID) | ORAL | Status: DC | PRN
  Administered 2014-08-02: 2 mg via ORAL
  Filled 2014-08-01: qty 1

## 2014-08-01 NOTE — Progress Notes (Signed)
[facility] PROGRESS NOTE  [name] Andersen JYN:[id number] DOB: 1950/07/15 DOA: 07/24/2014 PCP: Fredirick Maudlin, MD  Assessment/Plan:  Acute hypoxemic and hypercapnic respiratory failure - Thought to be due to acute viral syndrome prior to admission. - Status post extubation on 07/30/2014 - Continue pulmonary toilet. - Patient likely had mucous plug which caused her to be hypoxic improved with cough and some suctioning, patient declined deep suctioning. - Patient currently is DO NOT RESUSCITATE and DO NOT INTUBATE.  Left lower lobe pneumonia - Management as indicated above. - Completed a course of antibiotics.  SIRS - Due to above.  Resolved.  Hypokalemia - Replace as needed.  Severe protein calorie malnutrition - Complicated by chronic medical conditions. - Encourage supplemental nutrition.  Anemia - Likely due to chronic disease.  Quadriplegia - Chronic and stable. - Patient became that she is independent at home and has a caretaker at home.  Code Status: DO NOT RESUSCITATE/DO NOT INTUBATE, would like pressors if needed Family Communication: Daughter at bedside. Disposition Plan: Pending   Consultants:  None.  Procedures:  Extubation 07/30/2014  Antibiotics: Vanc 2/3>>2/4 Zosyn 2/3>>2/4 Azithromycin 2/2>>2/2 Ceftriaxone 2/2>>2/2 Ceftriaxone 2/4>>2/6 Zosyn 2/7 >>07/30/14   HPI/Subjective: Patient was hypoxic requiring face tent.  She indicates that it is not uncommon for her to have secretions in the morning and frequent cough.  Objective: Filed Vitals:   07/31/14 1516 07/31/14 2045 08/01/14 0400 08/01/14 0948  BP: 104/60 97/58 95/48    Pulse: 69 66 71   Temp: 98.3 F (36.8 C) 98.2 F (36.8 C) 97.8 F (36.6 C)   TempSrc: Oral Oral    Resp: Height:      Weight:      SpO2: 97% 95% 95% 96%    Intake/Output Summary (Last 24 hours) at 08/01/14 1026 Last data filed at 08/01/14 0537  Gross per 24 hour  Intake    880 ml  Output   2010  ml  Net  -1130 ml   Filed Weights   07/29/14 0500 07/30/14 0500 07/31/14 0500  Weight: 41.9 kg (92 lb 6 oz) 43.6 kg (96 lb 1.9 oz) 43.7 kg (96 lb 5.5 oz)    Exam:  Physical Exam: General: Awake, Oriented, No acute distress. HEENT: EOMI. Neck: Supple CV: S1 and S2 Lungs: Coarse breath sounds bilaterally, no crackles appreciated Abdomen: Soft, Nontender, Nondistended, +bowel sounds. Ext: Good pulses. Trace edema.   Data Reviewed: Basic Metabolic Panel:  Recent Labs Lab 07/26/14 1854  07/28/14 0243 07/29/14 0244 07/30/14 0507 07/31/14 0320 08/01/14 0738  NA  --   < > 138 142 138 139 141  K  --   < > 3.6 3.1* 3.4* 3.8 3.7  CL  --   < > 97 99 99 98 98  CO2  --   < > 34* 38* 31 28 33*  GLUCOSE  --   < > 112* 123* 113* 92 86  BUN  --   < > CREATININE  --   < > <0.30* <0.30* <0.30* <0.30* <0.30*  CALCIUM  --   < > 8.1* 8.4 8.5 8.8 8.6  MG 1.8  --   --  1.8 2.2 1.9 1.9  PHOS 1.8*  --   --  3.6 3.4 3.5 3.7  < > = values in this interval not displayed. Liver Function Tests:  Recent Labs Lab 07/26/14 0434 07/27/14 0232  AST 27 24  ALT 15 16  ALKPHOS 82 87  BILITOT 0.9 0.7  PROT 5.5* 5.5*  ALBUMIN 2.6* 2.5*   No results for input(s): LIPASE, AMYLASE in the last 168 hours. No results for input(s): AMMONIA in the last 168 hours. CBC:  Recent Labs Lab 07/27/14 0232 07/29/14 0244 07/30/14 0507 07/31/14 0320 08/01/14 0738  WBC 5.7 4.0 3.8* 4.5 3.3*  NEUTROABS 5.0 3.1 2.8 3.1 2.0  HGB 11.5* 11.0* 11.0* 11.2* 11.2*  HCT 33.9* 34.1* 34.7* 35.6* 35.4*  MCV 83.7 85.7 88.5 87.9 88.9  PLT 113* 134* 148* 181 200   Cardiac Enzymes: No results for input(s): CKTOTAL, CKMB, CKMBINDEX, TROPONINI in the last 168 hours. BNP (last 3 results) No results for input(s): BNP in the last 8760 hours.  ProBNP (last 3 results)  Recent Labs  03/10/14 1325  PROBNP 114.4    CBG:  Recent Labs Lab 07/31/14 1712 07/31/14 2043 08/01/14 0010 08/01/14 0458  08/01/14 0740  GLUCAP 90 110* 88 80 92    Recent Results (from the past 240 hour(s))  Blood culture (routine x 2)     Status: None   Collection Time: 07/24/14  9:59 AM  Result Value Ref Range Status   Specimen Description BLOOD RIGHT ARM DRAWN BY RN [id number]  Final   Special Requests   Final    BOTTLES DRAWN AEROBIC AND ANAEROBIC AEB=6CC ANA=4CC  IMMUNE:COMPROMISED   Culture NO GROWTH 5 DAYS  Final   Report Status 07/29/2014 FINAL  Final  MRSA PCR Screening     Status: None   Collection Time: 07/25/14  5:37 AM  Result Value Ref Range Status   MRSA by PCR NEGATIVE NEGATIVE Final    Comment:        The GeneXpert MRSA Assay (FDA approved for NASAL specimens only), is one component of a comprehensive MRSA colonization surveillance program. It is not intended to diagnose MRSA infection nor to guide or monitor treatment for MRSA infections.   Culture, respiratory (NON-Expectorated)     Status: None   Collection Time: 07/25/14 11:30 AM  Result Value Ref Range Status   Specimen Description TRACHEAL ASPIRATE  Final   Special Requests NONE  Final   Gram Stain   Final    MODERATE WBC PRESENT,BOTH PMN AND MONONUCLEAR NO SQUAMOUS EPITHELIAL CELLS SEEN NO ORGANISMS SEEN [facility]formed [facility]    Culture   Final    NO GROWTH 2 DAYS Performed at [facility]    Report Status 07/28/2014 FINAL  Final  Culture, blood (routine x 2)     Status: None (Preliminary result)   Collection Time: 07/26/14  7:07 PM  Result Value Ref Range Status   Specimen Description BLOOD LEFT HAND  Final   Special Requests BOTTLES DRAWN AEROBIC AND ANAEROBIC 5CC  Final   Culture   Final           BLOOD CULTURE RECEIVED NO GROWTH TO DATE CULTURE WILL BE HELD FOR 5 DAYS BEFORE ISSUING A FINAL NEGATIVE REPORT Performed at [facility]    Report Status PENDING  Incomplete  Culture, blood (routine x 2)     Status: None (Preliminary result)   Collection Time: 07/26/14  7:22 PM  Result  Value Ref Range Status   Specimen Description BLOOD LEFT HAND  Final   Special Requests   Final    BOTTLES DRAWN AEROBIC AND ANAEROBIC 5CC AER,4CC ANA   Culture   Final           BLOOD CULTURE RECEIVED NO GROWTH TO DATE  CULTURE WILL BE HELD FOR 5 DAYS BEFORE ISSUING A FINAL NEGATIVE REPORT Performed at [facility]    Report Status PENDING  Incomplete  Clostridium Difficile by PCR     Status: None   Collection Time: 07/29/14  1:12 PM  Result Value Ref Range Status   C difficile by pcr NEGATIVE NEGATIVE Final     Studies: No results found.  Scheduled Meds: . antiseptic oral rinse  7 mL Mouth Rinse q12n4p  . baclofen  40 mg Oral QID  . chlorhexidine  15 mL Mouth Rinse BID  . enoxaparin (LOVENOX) injection  30 mg Subcutaneous Q24H  . feeding supplement (ENSURE COMPLETE)  237 mL Oral BID BM  . gabapentin  600 mg Oral TID  . insulin aspart  0-9 Units Subcutaneous Q4H  . sodium chloride  3 mL Intravenous Q12H  . Suvorexant  0.5 tablet Oral QHS   Continuous Infusions: . dextrose 5 % and 0.45% NaCl 10 mL/hr (07/31/14 3664)    Principal Problem:   Acute respiratory failure Active Problems:   Quadriplegia   Protein-calorie malnutrition, severe   Hypokalemia   Hypoxemia   SIRS (systemic inflammatory response syndrome)   Acute respiratory failure with hypoxia   Bronchitis   Cough   DNR (do not resuscitate)    Carmine Carrozza A, MD  [facility] Pager [phone]. If 7PM-7AM, please contact night-coverage at [email or web address], password [facility] 08/01/2014, 10:26 AM  LOS: 8 days

## 2014-08-01 NOTE — Progress Notes (Signed)
Pt placed on 55% venti mask for desats post quad coughing and CPT. RN aware, family at bedside.

## 2014-08-01 NOTE — Progress Notes (Signed)
Pt did not feel up to doing CPT, stated she was nauseous from her Ensure. Weaned venti mask, will assess at next scheduled time.

## 2014-08-01 NOTE — Progress Notes (Signed)
Pt stated that she didn't want to do CPT this evening and didn't want to be woke up for CPT throughout the night. RT advised patient if she changed her mind or needed respiratory to call.

## 2014-08-02 DIAGNOSIS — J9600 Acute respiratory failure, unspecified whether with hypoxia or hypercapnia: Secondary | ICD-10-CM

## 2014-08-02 LAB — GLUCOSE, CAPILLARY
GLUCOSE-CAPILLARY: 136 mg/dL — AB (ref 70–99)
Glucose-Capillary: 105 mg/dL — ABNORMAL HIGH (ref 70–99)
Glucose-Capillary: 197 mg/dL — ABNORMAL HIGH (ref 70–99)
Glucose-Capillary: 95 mg/dL (ref 70–99)

## 2014-08-02 LAB — CULTURE, BLOOD (ROUTINE X 2)
CULTURE: NO GROWTH
CULTURE: NO GROWTH

## 2014-08-02 LAB — BASIC METABOLIC PANEL
Anion gap: 11 (ref 5–15)
BUN: <5 mg/dL — ABNORMAL LOW (ref 6–23)
CALCIUM: 8.7 mg/dL (ref 8.4–10.5)
CHLORIDE: 97 mmol/L (ref 96–112)
CO2: 33 mmol/L — ABNORMAL HIGH (ref 19–32)
Creatinine, Ser: <0.3 mg/dL — ABNORMAL LOW (ref 0.50–1.10)
Glucose, Bld: 123 mg/dL — ABNORMAL HIGH (ref 70–99)
Potassium: 3.5 mmol/L (ref 3.5–5.1)
Sodium: 141 mmol/L (ref 135–145)

## 2014-08-02 LAB — CBC
HEMATOCRIT: 41.1 % (ref 36.0–46.0)
Hemoglobin: 13.2 g/dL (ref 12.0–15.0)
MCH: 28 pg (ref 26.0–34.0)
MCHC: 32.1 g/dL (ref 30.0–36.0)
MCV: 87.3 fL (ref 78.0–100.0)
Platelets: 268 10*3/uL (ref 150–400)
RBC: 4.71 MIL/uL (ref 3.87–5.11)
RDW: 13 % (ref 11.5–15.5)
WBC: 6 10*3/uL (ref 4.0–10.5)

## 2014-08-02 MED ORDER — SACCHAROMYCES BOULARDII 250 MG PO CAPS
250.0000 mg | ORAL_CAPSULE | Freq: Two times a day (BID) | ORAL | Status: DC
  Administered 2014-08-02 – 2014-08-07 (×11): 250 mg via ORAL
  Filled 2014-08-02 (×12): qty 1

## 2014-08-02 MED ORDER — SODIUM CHLORIDE 0.9 % IV BOLUS (SEPSIS)
500.0000 mL | Freq: Once | INTRAVENOUS | Status: AC
  Administered 2014-08-02: 500 mL via INTRAVENOUS

## 2014-08-02 MED ORDER — SODIUM CHLORIDE 0.9 % IV SOLN
INTRAVENOUS | Status: DC
  Administered 2014-08-02: 17:00:00 via INTRAVENOUS

## 2014-08-02 NOTE — Progress Notes (Signed)
Pt refused CPT. Pt not feeling well and had a bad time night. Will check back at next rounds.

## 2014-08-02 NOTE — Progress Notes (Addendum)
[facility] PROGRESS NOTE  [name] Andersen RUE:[id number] DOB: 10-03-1950 DOA: 07/24/2014 PCP: Fredirick Maudlin, MD  Assessment/Plan:  Acute hypoxemic and hypercapnic respiratory failure - Thought to be due to acute viral syndrome prior to admission. - Status post extubation on 07/30/2014 - Continue pulmonary toilet. - Patient likely had mucous plug on 08/01/2014 which caused her to be hypoxic improved with cough and some suctioning, patient declined deep suctioning and chest physiotherapy. - Patient currently is DO NOT RESUSCITATE and DO NOT INTUBATE.  Diarrhea - Check stool for C. difficile and stool culture. - C. difficile on 07/29/2014 was negative.  Left lower lobe pneumonia - Management as indicated above. - Completed a course of antibiotics.  SIRS - Due to above.  Resolved.  Hypokalemia - Replace as needed.  Severe protein calorie malnutrition - Complicated by chronic medical conditions. - Encourage supplemental nutrition.  Anemia - Likely due to chronic disease.  Quadriplegia - Chronic and stable. - Patient became that she is independent at home and has a caretaker at home.  Generalized weakness - Request physical therapy evaluation.  Addendum: Patient during the day had episode of hypotension, was not symptomatic.  Reviewing patient's chart, patient has had low blood pressures at baseline.  Patient given a bolus of normal saline and started on maintenance at 75 cc per hour.  Follow patient's mentation and blood pressure carefully.  Code Status: DO NOT RESUSCITATE/DO NOT INTUBATE, would like pressors if needed Family Communication: Daughter and husband at bedside. Disposition Plan: Pending   Consultants:  None.  Procedures:  Extubation 07/30/2014  Antibiotics: Vanc 2/3>>2/4 Zosyn 2/3>>2/4 Azithromycin 2/2>>2/2 Ceftriaxone 2/2>>2/2 Ceftriaxone 2/4>>2/6 Zosyn 2/7 >>07/30/14   HPI/Subjective: Patient during the night has had multiple episodes of  diarrhea.  She is feeling weak and tired this morning and feels like she has no energy.  Objective: Filed Vitals:   08/01/14 1352 08/01/14 2057 08/01/14 2058 08/02/14 0901  BP: 86/54 193/112 149/98   Pulse: 78 76    Temp: 98.3 F (36.8 C) 98.4 F (36.9 C)    TempSrc: Oral Oral    Resp: 14     Height:      Weight:      SpO2: 95% 100% 99% 91%    Intake/Output Summary (Last 24 hours) at 08/02/14 1220 Last data filed at 08/02/14 1020  Gross per 24 hour  Intake 582.83 ml  Output   2790 ml  Net -2207.17 ml   Filed Weights   07/29/14 0500 07/30/14 0500 07/31/14 0500  Weight: 41.9 kg (92 lb 6 oz) 43.6 kg (96 lb 1.9 oz) 43.7 kg (96 lb 5.5 oz)    Exam:  Physical Exam: General: Awake but feeling weak and tired, Oriented, No acute distress. HEENT: EOMI. Neck: Supple CV: S1 and S2 Lungs: Coarse breath sounds bilaterally, no crackles appreciated Abdomen: Soft, Nontender, Nondistended, +bowel sounds. Ext: Good pulses. Trace edema.   Data Reviewed: Basic Metabolic Panel:  Recent Labs Lab 07/26/14 1854  07/29/14 0244 07/30/14 0507 07/31/14 0320 08/01/14 0738 08/02/14 0829  NA  --   < > 142 138 139 141 141  K  --   < > 3.1* 3.4* 3.8 3.7 3.5  CL  --   < > 99 99 98 98 97  CO2  --   < > 38* 31 28 33* 33*  GLUCOSE  --   < > 123* 113* 92 86 123*  BUN  --   < > <5*  CREATININE  --   < > <  0.30* <0.30* <0.30* <0.30* <0.30*  CALCIUM  --   < > 8.4 8.5 8.8 8.6 8.7  MG 1.8  --  1.8 2.2 1.9 1.9  --   PHOS 1.8*  --  3.6 3.4 3.5 3.7  --   < > = values in this interval not displayed. Liver Function Tests:  Recent Labs Lab 07/27/14 0232  AST 24  ALT 16  ALKPHOS 87  BILITOT 0.7  PROT 5.5*  ALBUMIN 2.5*   No results for input(s): LIPASE, AMYLASE in the last 168 hours. No results for input(s): AMMONIA in the last 168 hours. CBC:  Recent Labs Lab 07/27/14 0232 07/29/14 0244 07/30/14 0507 07/31/14 0320 08/01/14 0738 08/02/14 0829  WBC 5.7 4.0 3.8* 4.5 3.3*  6.0  NEUTROABS 5.0 3.1 2.8 3.1 2.0  --   HGB 11.5* 11.0* 11.0* 11.2* 11.2* 13.2  HCT 33.9* 34.1* 34.7* 35.6* 35.4* 41.1  MCV 83.7 85.7 88.5 87.9 88.9 87.3  PLT 113* 134* 148* 181 200 268   Cardiac Enzymes: No results for input(s): CKTOTAL, CKMB, CKMBINDEX, TROPONINI in the last 168 hours. BNP (last 3 results) No results for input(s): BNP in the last 8760 hours.  ProBNP (last 3 results)  Recent Labs  03/10/14 1325  PROBNP 114.4    CBG:  Recent Labs Lab 08/01/14 2052 08/02/14 0009 08/02/14 0415 08/02/14 0849 08/02/14 1148  GLUCAP 94 95 105* 136* 197*    Recent Results (from the past 240 hour(s))  Blood culture (routine x 2)     Status: None   Collection Time: 07/24/14  9:59 AM  Result Value Ref Range Status   Specimen Description BLOOD RIGHT ARM DRAWN BY RN [phone]  Final   Special Requests   Final    BOTTLES DRAWN AEROBIC AND ANAEROBIC AEB=6CC ANA=4CC  IMMUNE:COMPROMISED   Culture NO GROWTH 5 DAYS  Final   Report Status 07/29/2014 FINAL  Final  MRSA PCR Screening     Status: None   Collection Time: 07/25/14  5:37 AM  Result Value Ref Range Status   MRSA by PCR NEGATIVE NEGATIVE Final    Comment:        The GeneXpert MRSA Assay (FDA approved for NASAL specimens only), is one component of a comprehensive MRSA colonization surveillance program. It is not intended to diagnose MRSA infection nor to guide or monitor treatment for MRSA infections.   Culture, respiratory (NON-Expectorated)     Status: None   Collection Time: 07/25/14 11:30 AM  Result Value Ref Range Status   Specimen Description TRACHEAL ASPIRATE  Final   Special Requests NONE  Final   Gram Stain   Final    MODERATE WBC PRESENT,BOTH PMN AND MONONUCLEAR NO SQUAMOUS EPITHELIAL CELLS SEEN NO ORGANISMS SEEN Performed at [facility]    Culture   Final    NO GROWTH 2 DAYS Performed at [facility]    Report Status 07/28/2014 FINAL  Final  Culture, blood (routine x 2)     Status:  None   Collection Time: 07/26/14  7:07 PM  Result Value Ref Range Status   Specimen Description BLOOD LEFT HAND  Final   Special Requests BOTTLES DRAWN AEROBIC AND ANAEROBIC 5CC  Final   Culture   Final    NO GROWTH 5 DAYS Performed at [facility]    Report Status 08/02/2014 FINAL  Final  Culture, blood (routine x 2)     Status: None   Collection Time: 07/26/14  7:22 PM  Result  Value Ref Range Status   Specimen Description BLOOD LEFT HAND  Final   Special Requests   Final    BOTTLES DRAWN AEROBIC AND ANAEROBIC 5CC AER,4CC ANA   Culture   Final    NO GROWTH 5 DAYS Performed at [facility]    Report Status 08/02/2014 FINAL  Final  Clostridium Difficile by PCR     Status: None   Collection Time: 07/29/14  1:12 PM  Result Value Ref Range Status   C difficile by pcr NEGATIVE NEGATIVE Final     Studies: No results found.  Scheduled Meds: . antiseptic oral rinse  7 mL Mouth Rinse q12n4p  . baclofen  40 mg Oral QID  . chlorhexidine  15 mL Mouth Rinse BID  . enoxaparin (LOVENOX) injection  30 mg Subcutaneous Q24H  . feeding supplement (ENSURE COMPLETE)  237 mL Oral BID BM  . gabapentin  600 mg Oral TID  . saccharomyces boulardii  250 mg Oral BID  . sodium chloride  3 mL Intravenous Q12H  . Suvorexant  0.5 tablet Oral QHS   Continuous Infusions: . dextrose 5 % and 0.45% NaCl 10 mL/hr (07/31/14 16100838)    Principal Problem:   Acute respiratory failure Active Problems:   Quadriplegia   Protein-calorie malnutrition, severe   Hypokalemia   Hypoxemia   SIRS (systemic inflammatory response syndrome)   Acute respiratory failure with hypoxia   Bronchitis   Cough   DNR (do not resuscitate)    Madiha Bambrick A, MD  [facility] Pager [phone]. If 7PM-7AM, please contact night-coverage at [email or web address], password [email or web address] 08/02/2014, 12:20 PM  LOS: 9 days

## 2014-08-02 NOTE — Progress Notes (Signed)
PT Cancellation Note  Patient Details Name: [name] GensJane M Perkey MRN: [id number] DOB: 11/04/1950   Cancelled Treatment:    Reason Eval/Treat Not Completed: Patient not medically ready Pt hypotensive this PM. Not safe to participate in therapy. Pt to be given IV bolus. Will follow up next available time as appropriate.   Alvie HeidelbergFolan, Veniamin Kincaid A 08/02/2014, 4:06 PM  Alvie HeidelbergShauna Folan, PT, DPT [phone]

## 2014-08-02 NOTE — Progress Notes (Signed)
Patient with increase in loose stools this shift, C-diff negative on 2/7, MD notified and stool culture as well as immodium ordered, will continue to monitor.   Brent BullaJilian Emonte Dieujuste RN

## 2014-08-02 NOTE — Progress Notes (Addendum)
BP 68/50 manual check, right radial pulse +1 HR 72. Patient denies any symptoms and states "my blood pressure normally runs 70s over something". Dr. Betti Cruzeddy notified. Bolus ordered.   500 cc bolus given. 5:04 PM BP 78/50 pt still denies symptoms and stating this is normal for her. MD to start her on fluids and check VS Q4.

## 2014-08-03 LAB — BASIC METABOLIC PANEL
ANION GAP: 7 (ref 5–15)
BUN: 6 mg/dL (ref 6–23)
CO2: 34 mmol/L — ABNORMAL HIGH (ref 19–32)
Calcium: 8.1 mg/dL — ABNORMAL LOW (ref 8.4–10.5)
Chloride: 100 mmol/L (ref 96–112)
Creatinine, Ser: <0.3 mg/dL — ABNORMAL LOW (ref 0.50–1.10)
Glucose, Bld: 97 mg/dL (ref 70–99)
POTASSIUM: 3.4 mmol/L — AB (ref 3.5–5.1)
SODIUM: 141 mmol/L (ref 135–145)

## 2014-08-03 LAB — CBC
HCT: 34.9 % — ABNORMAL LOW (ref 36.0–46.0)
Hemoglobin: 10.9 g/dL — ABNORMAL LOW (ref 12.0–15.0)
MCH: 27.8 pg (ref 26.0–34.0)
MCHC: 31.2 g/dL (ref 30.0–36.0)
MCV: 89 fL (ref 78.0–100.0)
PLATELETS: 240 10*3/uL (ref 150–400)
RBC: 3.92 MIL/uL (ref 3.87–5.11)
RDW: 13.6 % (ref 11.5–15.5)
WBC: 5.2 10*3/uL (ref 4.0–10.5)

## 2014-08-03 MED ORDER — POTASSIUM CHLORIDE CRYS ER 20 MEQ PO TBCR
40.0000 meq | EXTENDED_RELEASE_TABLET | Freq: Once | ORAL | Status: AC
  Administered 2014-08-03: 40 meq via ORAL
  Filled 2014-08-03: qty 2

## 2014-08-03 NOTE — Progress Notes (Signed)
PT Cancellation Note  Patient Details Name: [name] GensJane M Andersen MRN: [id number] DOB: 08/06/1950   Cancelled Treatment:    Reason Eval/Treat Not Completed: PT screened, no needs identified, will sign off. Spoke with pt and family. Pt has 24/7 (A). Reports she is transferred by total (A) to wheelchair daily. No acute PT needs. Pt has modified bed, wheelchair, hoyer lift. Will sign off at this time as pt does not wish to have any acute PT or home PT.    Donnamarie [place]agWest, Samani Deal [place] , [place]  [phone]  08/03/2014, 3:56 PM

## 2014-08-03 NOTE — Progress Notes (Signed)
Patient ID: [name] Andersen Kincade, female   DOB: 09/21/1950, 64 y.o.   MRN: [id number]  [facility] PROGRESS NOTE  [name] Andersen Seipp WUJ:[id number] DOB: 05/03/1951 DOA: 07/24/2014 PCP: Fredirick MaudlinHAWKINS,EDWARD L, MD   Brief narrative:    Patient presented to [facility] ED with a recent history of worsening shortness of breath with associated non-productive cough and wheezing. No fevers but had some chills. In the ED, initial x-ray significant for COPD and no noticeable infiltrate but patient tachycardic, hypoxemic and hypotensive. She was admitted to the ICU for acute respiratory failure and started on antibiotics, including azithromycin and ceftriaxone, and eventually switched to vancomycin and zosyn. She was intubated for respiratory failure and blood pressure responded to multiple fluid boluses.  Assessment/Plan:    Acute hypoxemic and hypercapnic respiratory failure - Thought to be due to acute viral syndrome prior to admission. - Status post extubation on 07/30/2014 - Continue pulmonary toilet. - Patient likely had mucous plug on 08/01/2014 which caused her to be hypoxic improved with cough and some suctioning, patient declined deep suctioning and chest physiotherapy. - Patient currently is DO NOT RESUSCITATE and DO NOT INTUBATE. - stable respiratory status this AM, continues to require oxygen via [place]  Diarrhea - stool culture pending - diarrhea resolved  - C. difficile on 07/29/2014 was negative.  Left lower lobe pneumonia - Completed a course of antibiotics.  SIRS - Due to above. Resolved. - BP stable   Hypokalemia - continue to supplement and repeat BMP in AM  Severe protein calorie malnutrition - Complicated by chronic medical conditions. - Encourage supplemental nutrition.  Anemia - Likely due to chronic disease. - no signs of active bleeding - repeat CBC in AM  Quadriplegia - Chronic and stable. - Patient became that she is independent at home and has a caretaker at  home.  Generalized weakness - declining PT evaluation   DVT prophylaxis - Lovenox SQ   Code Status: DO NOT RESUSCITATE/DO NOT INTUBATE, OK with pressors if needed Family Communication: Daughter and husband at bedside. Disposition Plan: Home in AM  IV access:  Peripheral IV  Procedures and diagnostic studies:    Ct Angio Chest Pe W/cm 07/24/2014   No evidence of pulmonary embolus.  Consolidation of the superior segment of left lower lobe is noted consistent with pneumonia or subsegmental atelectasis. The bronchus in this area appears to be occluded suggesting mucous plug or aspirated debris.  Several lower lobe bronchi on the right side suggesting aspiration.  CXR  07/30/2014   Lines and tubes in good anatomic position. Persistent right lower lobe infiltrate consistent with pneumonia.  CXR  07/29/2014   Stable positioning of support apparatus.  No pneumothorax. 2. Interval development of ill-defined heterogeneous airspace opacities within the right mid/lower lung worrisome for infection and/or aspiration.  CXR 07/28/2014  Stable support devices and COPD.     CXR 07/27/2014  Stable lines and tubes. 2. Stable pulmonary hyperinflation. No acute cardiopulmonary abnormality.     CXR  07/26/2014  COPD changes with bronchitic changes and suspect LEFT lower lobe pneumonia, little changed.     CXR  07/26/2014  COPD with diffusely increased interstitial markings which appears stable. Increasing confluent alveolar densities in the lower lobes are consistent with pneumonia, possibly secondary to aspiration.   CXR  07/25/2014   Enteric tube projects below the diaphragm, tip not visualized. Improved left lower lobe airspace disease.     CXR  07/25/2014  Endotracheal tube 4.5 cm from the carina. 2. Medial left  lower lobe consolidation. Increasing perihilar upper lobe markings, may reflect cephalization and developing vascular congestion.     CXR  07/25/2014  Medial left lower lobe consolidation, likely mildly  increased compared to prior exam. Underlying emphysema  CXR 07/24/2014  Underlying emphysematous change.  No edema or consolidation.      Medical Consultants:  PCCM while requiring intubation   Other Consultants:  None   IAnti-Infectives:   Vanc 2/3>>2/4 Zosyn 2/3>>2/4 Azithromycin 2/2>>2/2 Ceftriaxone 2/2>>2/2 Ceftriaxone 2/4>>2/6 Zosyn 2/7 >>07/30/14   Debbora Presto, MD  [facility] Pager [phone]  If 7PM-7AM, please contact night-coverage [email or web address] Password [facility] 08/03/2014, 2:49 PM   LOS: 10 days   HPI/Subjective: No events overnight.   Objective: Filed Vitals:   08/03/14 4540 08/03/14 0815 08/03/14 1343 08/03/14 1350  BP: 84/46 81/49 102/64   Pulse: 70 77 93   Temp: 98.2 F (36.8 C) 97.8 F (36.6 C) 97.6 F (36.4 C)   TempSrc: Oral Oral    Resp: 18 18    Height:      Weight:      SpO2: 95% 89% 89% 93%    Intake/Output Summary (Last 24 hours) at 08/03/14 1449 Last data filed at 08/03/14 1419  Gross per 24 hour  Intake 2420.5 ml  Output   2950 ml  Net -529.5 ml    Exam:   General:  Pt is alert, follows commands appropriately, not in acute distress, quadriplegic   Cardiovascular: Regular rate and rhythm,  no rubs, no gallops  Respiratory: Clear to auscultation bilaterally, no wheezing, diminished breath sounds at bases, rales minimal bilaterally   Abdomen: Soft, non tender, non distended, bowel sounds present, no guarding  Extremities: pulses DP and PT palpable bilaterally  Data Reviewed: Basic Metabolic Panel:  Recent Labs Lab 07/29/14 0244 07/30/14 0507 07/31/14 0320 08/01/14 0738 08/02/14 0829 08/03/14 0553  NA 142 138 139 141 141 141  K 3.1* 3.4* 3.8 3.7 3.5 3.4*  CL 99 99 98 98 97 100  CO2 38* 31 28 33* 33* 34*  GLUCOSE 123* 113* 92 86 123* 97  BUN <5* 6  CREATININE <0.30* <0.30* <0.30* <0.30* <0.30* <0.30*  CALCIUM 8.4 8.5 8.8 8.6 8.7 8.1*  MG 1.8 2.2 1.9 1.9  --   --   PHOS 3.6 3.4 3.5 3.7  --   --     CBC:  Recent Labs Lab 07/29/14 0244 07/30/14 0507 07/31/14 0320 08/01/14 0738 08/02/14 0829 08/03/14 0553  WBC 4.0 3.8* 4.5 3.3* 6.0 5.2  NEUTROABS 3.1 2.8 3.1 2.0  --   --   HGB 11.0* 11.0* 11.2* 11.2* 13.2 10.9*  HCT 34.1* 34.7* 35.6* 35.4* 41.1 34.9*  MCV 85.7 88.5 87.9 88.9 87.3 89.0  PLT 134* 148* 181 200 268 240   CBG:  Recent Labs Lab 08/01/14 2052 08/02/14 0009 08/02/14 0415 08/02/14 0849 08/02/14 1148  GLUCAP 94 95 105* 136* 197*    Recent Results (from the past 240 hour(s))  MRSA PCR Screening     Status: None   Collection Time: 07/25/14  5:37 AM  Result Value Ref Range Status   MRSA by PCR NEGATIVE NEGATIVE Final  Culture, respiratory (NON-Expectorated)     Status: None   Collection Time: 07/25/14 11:30 AM  Result Value Ref Range Status   Specimen Description TRACHEAL ASPIRATE  Final   Special Requests NONE  Final   Gram Stain   Final    MODERATE WBC PRESENT,BOTH PMN AND MONONUCLEAR NO SQUAMOUS EPITHELIAL CELLS  SEEN NO ORGANISMS SEEN Performed at [facility]    Culture   Final    NO GROWTH 2 DAYS Performed at [facility]    Report Status 07/28/2014 FINAL  Final  Culture, blood (routine x 2)     Status: None   Collection Time: 07/26/14  7:07 PM  Result Value Ref Range Status   Specimen Description BLOOD LEFT HAND  Final   Special Requests BOTTLES DRAWN AEROBIC AND ANAEROBIC 5CC  Final   Culture   Final    NO GROWTH 5 DAYS Performed at [facility]    Report Status 08/02/2014 FINAL  Final  Culture, blood (routine x 2)     Status: None   Collection Time: 07/26/14  7:22 PM  Result Value Ref Range Status   Specimen Description BLOOD LEFT HAND  Final   Special Requests   Final    BOTTLES DRAWN AEROBIC AND ANAEROBIC 5CC AER,4CC ANA   Culture   Final    NO GROWTH 5 DAYS Performed at [facility]    Report Status 08/02/2014 FINAL  Final  Clostridium Difficile by PCR     Status: None   Collection Time:  07/29/14  1:12 PM  Result Value Ref Range Status   C difficile by pcr NEGATIVE NEGATIVE Final  Stool culture     Status: None (Preliminary result)   Collection Time: 08/02/14  1:00 AM  Result Value Ref Range Status   Specimen Description STOOL  Final   Special Requests NONE  Final   Culture   Final    Culture reincubated for better growth Performed at [facility]    Report Status PENDING  Incomplete     Scheduled Meds: . baclofen  40 mg Oral QID  . enoxaparin  injection  30 mg Subcutaneous Q24H  . gabapentin  600 mg Oral TID  . saccharomyces   250 mg Oral BID  . Suvorexant  0.5 tablet Oral QHS   Continuous Infusions: . sodium chloride 75 mL/hr at 08/02/14 1710

## 2014-08-03 NOTE — Progress Notes (Signed)
Patient refused CPT.

## 2014-08-03 NOTE — Progress Notes (Signed)
PT refused CPT. 

## 2014-08-04 ENCOUNTER — Inpatient Hospital Stay (HOSPITAL_COMMUNITY): Payer: BLUE CROSS/BLUE SHIELD

## 2014-08-04 DIAGNOSIS — J9811 Atelectasis: Secondary | ICD-10-CM | POA: Insufficient documentation

## 2014-08-04 LAB — CBC
HCT: 35.3 % — ABNORMAL LOW (ref 36.0–46.0)
HEMOGLOBIN: 11.2 g/dL — AB (ref 12.0–15.0)
MCH: 27.9 pg (ref 26.0–34.0)
MCHC: 31.7 g/dL (ref 30.0–36.0)
MCV: 87.8 fL (ref 78.0–100.0)
PLATELETS: 257 10*3/uL (ref 150–400)
RBC: 4.02 MIL/uL (ref 3.87–5.11)
RDW: 13.3 % (ref 11.5–15.5)
WBC: 9.1 10*3/uL (ref 4.0–10.5)

## 2014-08-04 LAB — BASIC METABOLIC PANEL
Anion gap: 6 (ref 5–15)
BUN: <5 mg/dL — ABNORMAL LOW (ref 6–23)
CALCIUM: 8.3 mg/dL — AB (ref 8.4–10.5)
CHLORIDE: 101 mmol/L (ref 96–112)
CO2: 33 mmol/L — ABNORMAL HIGH (ref 19–32)
Creatinine, Ser: <0.3 mg/dL — ABNORMAL LOW (ref 0.50–1.10)
GLUCOSE: 94 mg/dL (ref 70–99)
POTASSIUM: 4 mmol/L (ref 3.5–5.1)
Sodium: 140 mmol/L (ref 135–145)

## 2014-08-04 MED ORDER — GUAIFENESIN ER 600 MG PO TB12
1200.0000 mg | ORAL_TABLET | Freq: Two times a day (BID) | ORAL | Status: DC
  Administered 2014-08-04 – 2014-08-07 (×6): 1200 mg via ORAL
  Filled 2014-08-04 (×7): qty 2

## 2014-08-04 MED ORDER — ACETYLCYSTEINE 20 % IN SOLN
3.0000 mL | Freq: Three times a day (TID) | RESPIRATORY_TRACT | Status: AC
  Administered 2014-08-04: 3 mL via RESPIRATORY_TRACT
  Administered 2014-08-04 – 2014-08-05 (×3): via RESPIRATORY_TRACT
  Administered 2014-08-05: 3 mL via RESPIRATORY_TRACT
  Administered 2014-08-06: 4 mL via RESPIRATORY_TRACT
  Filled 2014-08-04 (×8): qty 4

## 2014-08-04 MED ORDER — SACCHAROMYCES BOULARDII 250 MG PO CAPS: 250.0000 mg | ORAL_CAPSULE | Freq: Two times a day (BID) | ORAL | Status: AC

## 2014-08-04 MED ORDER — ALPRAZOLAM ER 0.5 MG PO TB24: 0.5000 mg | ORAL_TABLET | Freq: Every evening | ORAL | Status: AC | PRN

## 2014-08-04 MED ORDER — LEVALBUTEROL HCL 0.63 MG/3ML IN NEBU: 0.6300 mg | INHALATION_SOLUTION | Freq: Four times a day (QID) | RESPIRATORY_TRACT | Status: DC | PRN

## 2014-08-04 MED ORDER — ALBUTEROL SULFATE (2.5 MG/3ML) 0.083% IN NEBU
2.5000 mg | INHALATION_SOLUTION | Freq: Four times a day (QID) | RESPIRATORY_TRACT | Status: DC
  Administered 2014-08-04 – 2014-08-07 (×10): 2.5 mg via RESPIRATORY_TRACT
  Filled 2014-08-04 (×10): qty 3

## 2014-08-04 MED ORDER — SUVOREXANT 20 MG PO TABS: 0.5000 | ORAL_TABLET | Freq: Every day | ORAL | Status: AC

## 2014-08-04 NOTE — Progress Notes (Signed)
Pt refused first treatment of cpt this morning.  Sats have been down so her husband and I turned her and sats inproved and cpt done with the bed for 20 minutes.

## 2014-08-04 NOTE — Progress Notes (Signed)
Placed patient on CPAP, 5cmH20 with 15L02 bleed in.  Patient is tolerating well at this time.Tried patient on pressure 7cmH20 but she did not tolerate it.

## 2014-08-04 NOTE — Discharge Instructions (Signed)
Acute Respiratory Failure °Respiratory failure is when your lungs are not working well and your breathing (respiratory) system fails. When respiratory failure occurs, it is difficult for your lungs to get enough oxygen, get rid of carbon dioxide, or both. Respiratory failure can be life threatening.  °Respiratory failure can be acute or chronic. Acute respiratory failure is sudden, severe, and requires emergency medical treatment. Chronic respiratory failure is less severe, happens over time, and requires ongoing treatment.  °WHAT ARE THE CAUSES OF ACUTE RESPIRATORY FAILURE?  °Any problem affecting the heart or lungs can cause acute respiratory failure. Some of these causes include the following: °· Chronic bronchitis and emphysema (COPD).   °· Blood clot going to a lung (pulmonary embolism).   °· Having water in the lungs caused by heart failure, lung injury, or infection (pulmonary edema).   °· Collapsed lung (pneumothorax).   °· Pneumonia.   °· Pulmonary fibrosis.   °· Obesity.   °· Asthma.   °· Heart failure.   °· Any type of trauma to the chest that can make breathing difficult.   °· Nerve or muscle diseases making chest movements difficult. °WHAT SYMPTOMS SHOULD YOU WATCH FOR?  °If you have any of these signs or symptoms, you should seek immediate medical care:  °· You have shortness of breath (dyspnea) with or without activity.   °· You have rapid, fast breathing (tachypnea).   °· You are wheezing. °· You are unable to say more than a few words without having to catch your breath. °· You find it very difficult to function normally. °· You have a fast heart rate.   °· You have a bluish color to your finger or toe nail beds.   °· You have confusion or drowsiness or both.   °HOW WILL MY ACUTE RESPIRATORY FAILURE BE TREATED?  °Treatment of acute respiratory failure depends on the cause of the respiratory failure. Usually, you will stay in the intensive care unit so your breathing can be watched closely. Treatment  can include the following: °· Oxygen. Oxygen can be delivered through the following: °¨ Nasal cannula. This is small tubing that goes in your nose to give you oxygen. °¨ Face mask. A face mask covers your nose and mouth to give you oxygen. °· Medicine. Different medicines can be given to help with breathing. These can include: °¨ Nebulizers. Nebulizers deliver medicines to open the air passages (bronchodilators). These medicines help to open or relax the airways in the lungs so you can breathe better. They can also help loosen mucus from your lungs. °¨ Diuretics. Diuretic medicines can help you breathe better by getting rid of extra water in your body. °¨ Steroids. Steroid medicines can help decrease swelling (inflammation) in your lungs. °¨ Antibiotics. °· Chest tube. If you have a collapsed lung (pneumothorax), a chest tube is placed to help reinflate the lung. °· Non-invasive positive pressure ventilation (NPPV). This is a tight-fitting mask that goes over your nose and mouth. The mask has tubing that is attached to a machine. The machine blows air into the tubing, which helps to keep the tiny air sacs (alveoli) in your lungs open. This machine allows you to breathe on your own. °· Ventilator. A ventilator is a breathing machine. When on a ventilator, a breathing tube is put into the lungs. A ventilator is used when you can no longer breathe well enough on your own. You may have low oxygen levels or high carbon dioxide (CO2) levels in your blood. When you are on a ventilator, sedation and pain medicines are given to make you sleep   so your lungs can heal. °Document Released: 06/13/2013 Document Revised: 10/23/2013 Document Reviewed: 06/13/2013 °ExitCare® Patient Information ©2015 [employer]. This information is not intended to replace advice given to you by your health care provider. Make sure you discuss any questions you have with your health care provider. ° °

## 2014-08-04 NOTE — Progress Notes (Signed)
Patient ID: [name] Andersen, female   DOB: 11-25-1950, 64 y.o.   MRN: [id number]  [facility] PROGRESS NOTE  [name] Andersen HQI:[id number] DOB: 1951-01-06 DOA: 07/24/2014 PCP: Fredirick Maudlin, MD   Brief narrative:    Patient with Quadriplegia (1986); Spinal cord injury, C5-C7 (1986), presented to [facility] ED with a recent history of worsening shortness of breath with associated non-productive cough and wheezing. No fevers but had some chills. In the ED, initial x-ray significant for COPD and no noticeable infiltrate but patient tachycardic, hypoxemic and hypotensive. She was admitted to the ICU for acute respiratory failure and started on antibiotics, including azithromycin and ceftriaxone, and eventually switched to vancomycin and zosyn. She was intubated for respiratory failure and blood pressure responded to multiple fluid boluses.   SIGNIFICANT EVENTS: 07/24/14: patient admitted to [facility] 07/25/14: CXR: ? LLL consolidation, pt intubated  07/26/14: patient transferred to [facility] 31M ICU 07/29/14: ncreased diarrhea, neg c diff . failed SBT , low sats, increased wob, decreased secretions, refused nebs, thinks they are making her dizzy  07/30/14 : extubated based on medical criteria. Do not reintubate and no CPR per her wish. Full medical care otherwise 08/04/14: worsening respiratory status with hypoxia down to 80's on 4 L [place], requiring NRB, CXR with LL collapse and ? Mucous plug   Assessment/Plan:    Acute hypoxemic and hypercapnic respiratory failure - Thought to be due to acute viral syndrome prior to admission. - Status post extubation on 07/30/2014 (intubated 07/25/2014) - Patient likely had mucous plug on 08/01/2014 which caused her to be hypoxic improved with cough and some suctioning, patient declined deep suctioning and chest physiotherapy. - Patient currently is DO NOT RESUSCITATE and DO NOT INTUBATE. - morning of 2/13 with new and worsening hypoxia requiring NRB mask, CXR with new left  lung collapse and ? worsening infiltrate  - discussed with PCCM, recommend bronchodilators scheduled and as needed, mucinex 1200 mg BID, flutter valve  Diarrhea - stool culture still pending - diarrhea resolved  - C. difficile on 07/29/2014 was negative.  Left lower lobe pneumonia - Completed a course of antibiotics but now as of 2/13 with worsening respiratory status and CXR with left lung collapse, ? Infiltrate vs mucous plug  - please see PCCM rec's above   SIRS - Due to above. Resolved. - BP remains stable   Hypokalemia - supplemented and WNL this AM  Severe protein calorie malnutrition - Complicated by chronic medical conditions. - Encourage supplemental nutrition. - pt tolerating diet well, no N/V   Anemia - Likely due to chronic disease. - no signs of active bleeding, Hg remain stable  - repeat CBC in AM  Quadriplegia  - Chronic and stable. - Patient has a caretaker at home.  Generalized weakness - declining PT evaluation while inpatient   DVT prophylaxis - Lovenox SQ   Code Status: DO NOT RESUSCITATE/DO NOT INTUBATE, OK with pressors if needed Family Communication: Daughter and husband at bedside. Disposition Plan: Hold off on d/c plan as new worsening dyspnea, PCCM will see again in consultation   IV access:  Peripheral IV  Procedures and diagnostic studies:     Ct Angio Chest Pe W/cm 07/24/2014 No evidence of pulmonary embolus. Consolidation of the superior segment of left lower lobe is noted consistent with pneumonia or subsegmental atelectasis. The bronchus in this area appears to be occluded suggesting mucous plug or aspirated debris. Several lower lobe bronchi on the right side suggesting aspiration.  CXR 07/30/2014 Lines  and tubes in good anatomic position. Persistent right lower lobe infiltrate consistent with pneumonia.  CXR 07/29/2014 Stable positioning of support apparatus. No pneumothorax. 2. Interval development of ill-defined  heterogeneous airspace opacities within the right mid/lower lung worrisome for infection and/or aspiration.  CXR 07/28/2014 Stable support devices and COPD.   CXR 07/27/2014 Stable lines and tubes. 2. Stable pulmonary hyperinflation. No acute cardiopulmonary abnormality.   CXR 07/26/2014 COPD changes with bronchitic changes and suspect LEFT lower lobe pneumonia, little changed.   CXR 07/26/2014 COPD with diffusely increased interstitial markings which appears stable. Increasing confluent alveolar densities in the lower lobes are consistent with pneumonia, possibly secondary to aspiration.   CXR 07/25/2014 Enteric tube projects below the diaphragm, tip not visualized. Improved left lower lobe airspace disease.   CXR 07/25/2014 Endotracheal tube 4.5 cm from the carina. 2. Medial left lower lobe consolidation. Increasing perihilar upper lobe markings, may reflect cephalization and developing vascular congestion.   CXR 07/25/2014 Medial left lower lobe consolidation, likely mildly increased compared to prior exam. Underlying emphysema  CXR 07/24/2014 Underlying emphysematous change. No edema or consolidation.   Medical Consultants:  PCCM   Other Consultants:  None   IAnti-Infectives:   Vanc 2/3>>2/4 Zosyn 2/3>>2/4 Azithromycin 2/2>>2/2 Ceftriaxone 2/2>>2/2 Ceftriaxone 2/4>>2/6 Zosyn 2/7 >>07/30/14   Debbora Presto, MD  [facility] Pager [phone]  If 7PM-7AM, please contact night-coverage [email or web address] Password [facility] 08/04/2014, 3:17 PM   LOS: 11 days   HPI/Subjective: No events overnight.   Objective: Filed Vitals:   08/04/14 0636 08/04/14 0643 08/04/14 1125 08/04/14 1410  BP:    100/54  Pulse: 97 92  94  Temp:    98.2 F (36.8 C)  TempSrc:    Oral  Resp: Height:      Weight:      SpO2: 93% 95% 90% 100%    Intake/Output Summary (Last 24 hours) at 08/04/14 1517 Last data filed at 08/04/14 1141  Gross per 24 hour  Intake      0 ml  Output    2750 ml  Net  -2750 ml    Exam:   General:  Pt is alert, follows commands appropriately, not in acute distress  Cardiovascular: Regular rate and rhythm, no rubs, no gallops  Respiratory: RR in mid 20's, poor breath sounds on the left sife  Abdomen: Soft, non tender, non distended, bowel sounds present, no guarding  Extremities: No edema, pulses DP and PT palpable bilaterally  Neuro: paraplegic   Data Reviewed: Basic Metabolic Panel:  Recent Labs Lab 07/29/14 0244 07/30/14 0507 07/31/14 0320 08/01/14 0738 08/02/14 0829 08/03/14 0553 08/04/14 0510  NA 142 138 139 141 141 141 140  K 3.1* 3.4* 3.8 3.7 3.5 3.4* 4.0  CL 99 99 98 98 97 100 101  CO2 38* 31 28 33* 33* 34* 33*  GLUCOSE 123* 113* 92 86 123* 97 94  BUN <5* 6 <5*  CREATININE <0.30* <0.30* <0.30* <0.30* <0.30* <0.30* <0.30*  CALCIUM 8.4 8.5 8.8 8.6 8.7 8.1* 8.3*  MG 1.8 2.2 1.9 1.9  --   --   --   PHOS 3.6 3.4 3.5 3.7  --   --   --    CBC:  Recent Labs Lab 07/29/14 0244 07/30/14 0507 07/31/14 0320 08/01/14 0738 08/02/14 0829 08/03/14 0553 08/04/14 0510  WBC 4.0 3.8* 4.5 3.3* 6.0 5.2 9.1  NEUTROABS 3.1 2.8 3.1 2.0  --   --   --   HGB  11.0* 11.0* 11.2* 11.2* 13.2 10.9* 11.2*  HCT 34.1* 34.7* 35.6* 35.4* 41.1 34.9* 35.3*  MCV 85.7 88.5 87.9 88.9 87.3 89.0 87.8  PLT 134* 148* 181 200 268 240 257   CBG:  Recent Labs Lab 08/01/14 2052 08/02/14 0009 08/02/14 0415 08/02/14 0849 08/02/14 1148  GLUCAP 94 95 105* 136* 197*    Recent Results (from the past 240 hour(s))  Culture, blood (routine x 2)     Status: None   Collection Time: 07/26/14  7:07 PM  Result Value Ref Range Status   Specimen Description BLOOD LEFT HAND  Final   Special Requests BOTTLES DRAWN AEROBIC AND ANAEROBIC 5CC  Final   Culture   Final    NO GROWTH 5 DAYS [facility]formed at [facility]    Report Status 08/02/2014 FINAL  Final  Culture, blood (routine x 2)     Status: None   Collection Time: 07/26/14  7:22  PM  Result Value Ref Range Status   Specimen Description BLOOD LEFT HAND  Final   Special Requests   Final    BOTTLES DRAWN AEROBIC AND ANAEROBIC 5CC AER,4CC ANA   Culture   Final    NO GROWTH 5 DAYS [facility]formed at [facility]    Report Status 08/02/2014 FINAL  Final  Clostridium Difficile by PCR     Status: None   Collection Time: 07/29/14  1:12 PM  Result Value Ref Range Status   C difficile by pcr NEGATIVE NEGATIVE Final  Stool culture     Status: None (Preliminary result)   Collection Time: 08/02/14  1:00 AM  Result Value Ref Range Status   Specimen Description STOOL  Final   Special Requests NONE  Final   Culture   Final    Culture reincubated for better growth Performed at [facility]    Report Status PENDING  Incomplete     Scheduled Meds: . baclofen  40 mg Oral QID  . enoxaparin  injection  30 mg Subcutaneous Q24H  . gabapentin  600 mg Oral TID  . saccharomyces boulardii  250 mg Oral BID  . Suvorexant  0.5 tablet Oral QHS   Continuous Infusions:
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
## 2014-08-04 NOTE — Progress Notes (Signed)
Patient complaining of bladder distension and stating "i think my catheter is clogged and at home I change it out". Bladder scan results with catheter in. Attempted to flush per care order instruction. MD notified and order to place new catheter.

## 2014-08-05 ENCOUNTER — Inpatient Hospital Stay (HOSPITAL_COMMUNITY): Payer: BLUE CROSS/BLUE SHIELD

## 2014-08-05 DIAGNOSIS — D649 Anemia, unspecified: Secondary | ICD-10-CM

## 2014-08-05 DIAGNOSIS — R197 Diarrhea, unspecified: Secondary | ICD-10-CM
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
## 2014-08-05 NOTE — Plan of Care (Signed)
Problem: ICU Phase Progression Outcomes Goal: Initial discharge plan identified To return home with family
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
## 2014-08-05 NOTE — Progress Notes (Signed)
Placed pt. Back on cpap for an hour.

## 2014-08-05 NOTE — Progress Notes (Signed)
Took patient off CPAP and placed on 55%VM.  Sat 93%, HR 74.

## 2014-08-06 LAB — STOOL CULTURE

## 2014-08-06 NOTE — Progress Notes (Signed)
Transitioned patient to [facility] SATS 96%

## 2014-08-06 NOTE — Progress Notes (Signed)
[facility] PROGRESS NOTE  SHAMIRACLE GORDEN NWG:[id number] DOB: [birth year] DOA: 07/24/2014 PCP: Fredirick Maudlin, MD   Brief narrative:   [name] is a 64yo woman with quadriplegia since a spinal cord injury in 1986 who presented to [facility] with worsening SOB, non productive cough and wheezing with chills.  She was admitted to the ICU for acute respiratory failure and started on Abx, but quickly transitioned off as she was felt to have viral syndrome.  She was intubated and successfully extubated on 07/30/14.  She has requested to be DNR now, but pressors are okay.  She had worsening respiratory status on 08/04/14 which was thought to be due to mucous plugging.  She was placed on 48 hours of mucomyst nebs (completed 24 hours so far) and pulmonary vest with good improvement.    Patient started requiring additional oxygen support up to 60% Venturi facemask. Felt to be an additional mucous plug. By midafternoon, able to be weaned down to 6 L.  SIGNIFICANT EVENTS: 07/24/14: patient admitted to [facility] 07/25/14: CXR: ? LLL consolidation, pt intubated  07/26/14: patient transferred to [facility] 19M ICU 07/29/14: ncreased diarrhea, neg c diff . failed SBT , low sats, increased wob, decreased secretions, refused nebs, thinks they are making her dizzy  07/30/14 : extubated based on medical criteria. Do not reintubate and no CPR per her wish. Full medical care otherwise 08/04/14: worsening respiratory status with hypoxia down to 80's on 4 L [place], requiring NRB, CXR with LL collapse and ? Mucous plug  Assessment/Plan:     Acute hypoxic and hypercarbic respiratory failure with mucous plugging - Patient is doing better from her viral syndrome - She is on respiratory vest therapy and completed mucomyst nebsfor mucous plugging  - Breathing treatments  Diarrhea - Resolved - Cdiff and stool cultures negative  Hypokalemia - Resolved, recheck in the morning  Quadriplegia with Protein-calorie malnutrition, severe - S/P  injury in 1986, she declines PT evaluation - She is cared for at home and will return there on discharge - Xanax for anxiety - Ensure 2 times daily, ensure PO intake - Baclofen as per home meds  Anemia, normocytic - H/H stable yesterday.       Code Status: DNR, pressors OK.  Family Communication:  Husband that side Disposition Plan: Home likely tomorrow  IV access:  Peripheral IV  Procedures and diagnostic studies:    Ct Angio Chest Pe W/cm &/or Wo Cm  07/24/2014   CLINICAL DATA:  Hypoxia.  EXAM: CT ANGIOGRAPHY CHEST WITH CONTRAST  TECHNIQUE: Multidetector CT imaging of the chest was performed using the standard protocol during bolus administration of intravenous contrast. Multiplanar CT image reconstructions and MIPs were obtained to evaluate the vascular anatomy.  CONTRAST:  80mL OMNIPAQUE IOHEXOL 350 MG/ML SOLN  COMPARISON:  CT scan of March 10, 2014.  FINDINGS: No pneumothorax is noted. No significant pleural effusion is noted. Consolidation is noted in superior segment of left lower lobe consistent with pneumonia or subsegmental atelectasis. Material is noted within the lower lobe bronchus consistent with mucous plug or other inflammatory debris. Soft tissue debris is also noted in several lower lobe bronchi on the right side suggesting aspiration. There is no evidence of thoracic aortic dissection or aneurysm. There is no definite evidence of pulmonary embolus. No mediastinal mass or adenopathy is noted. Visualized portion of upper abdomen appears normal.  Review of the MIP images confirms the above findings.  IMPRESSION: No evidence of pulmonary embolus.  Consolidation of the superior  segment of left lower lobe is noted consistent with pneumonia or subsegmental atelectasis. The bronchus in this area appears to be occluded suggesting mucous plug or aspirated debris.  Soft tissue debris is also noted within several lower lobe bronchi on the right side suggesting aspiration as well.    Electronically Signed   By: Roque Lias M.D.   On: 07/24/2014 13:33   Dg Chest Portable 1 View  08/05/2014   CLINICAL DATA:  Pulmonary atelectasis  EXAM: PORTABLE CHEST - 1 VIEW  COMPARISON:  08/04/2014  FINDINGS: Left retrocardiac consolidation with air bronchograms. Improved aeration of the lingula and left upper lobe since previous exam. Coarse central interstitial markings in the right lung as before. No pneumothorax. No effusion. Cervical fixation hardware partially seen.  IMPRESSION: 1. Significant improvement in the left lung atelectasis with residual left retrocardiac consolidation and air bronchograms.   Electronically Signed   By: Corlis Leak M.D.   On: 08/05/2014 09:03   Dg Chest Port 1 View  08/04/2014   CLINICAL DATA:  Dyspnea  EXAM: PORTABLE CHEST - 1 VIEW  COMPARISON:  07/30/2014  FINDINGS: New dense opacification of the left mid and lower chest with volume loss and air bronchograms. There is also left medial apex opacity, suspect additional disease in the upper lobe. The could be superimposed pleural effusion. The right lung is relatively well aerated, with chronic scarring noted at the apex. No cardiomegaly when accounting for shift in left-sided opacity.  IMPRESSION: New extensive collapse of the left lung with superimposed infection not excluded. Consider mucous plug or interval aspiration.   Electronically Signed   By: Marnee Spring M.D.   On: 08/04/2014 11:00   Dg Chest Port 1 View  07/30/2014   CLINICAL DATA:  Pneumonia .  EXAM: PORTABLE CHEST - 1 VIEW  COMPARISON:  07/29/2014 .  FINDINGS: Endotracheal tube and NG tube in good anatomic position. Heart size normal. Right lower lobe infiltrate persists. No pleural effusion or pneumothorax.  IMPRESSION: 1. Lines and tubes in good anatomic position. 2. Persistent right lower lobe infiltrate consistent with pneumonia.   Electronically Signed   By: Maisie Fus  Register   On: 07/30/2014 07:18   Dg Chest Port 1 View  07/29/2014   CLINICAL DATA:   Acute respiratory failure. Community acquired pneumonia.  EXAM: PORTABLE CHEST - 1 VIEW  COMPARISON:  07/28/2014; 07/27/2014; 07/26/2014  FINDINGS: Grossly unchanged cardiac silhouette and mediastinal contours. Stable positioning of support apparatus. Interval development of ill-defined heterogeneous airspace opacities within the medial aspect of the right mid / lower lung. No pleural effusion or pneumothorax. No evidence of edema. No acute osseus abnormalities.  IMPRESSION: 1.  Stable positioning of support apparatus.  No pneumothorax. 2. Interval development of ill-defined heterogeneous airspace opacities within the right mid/lower lung worrisome for infection and/or aspiration. Continued attention on follow-up is recommended.   Electronically Signed   By: Simonne Come M.D.   On: 07/29/2014 09:06   Dg Chest Port 1 View  07/28/2014   CLINICAL DATA:  Subsequent evaluation of acute respiratory failure with hypoxia and pneumonia, history of quadriplegia and spinal cord injury  EXAM: PORTABLE CHEST - 1 VIEW  COMPARISON:  07/27/2014  FINDINGS: Unchanged endotracheal tube. Stable orogastric tube. Heart size and vascular pattern normal. Hyperinflation suggests COPD. Stable bilateral bronchitic change and bronchiectasis. No focal parenchymal opacities.  IMPRESSION: Stable support devices and COPD.   Electronically Signed   By: Esperanza Heir M.D.   On: 07/28/2014 09:00   Dg  Chest Port 1 View  07/27/2014   CLINICAL DATA:  64 year old female with pneumonia, acute respiratory failure. Initial encounter.  EXAM: PORTABLE CHEST - 1 VIEW  COMPARISON:  07/26/2014 and earlier.  FINDINGS: Portable AP semi upright view at 0431 hrs. Large lung volumes again noted. Endotracheal tube is stable tip just below the clavicles. Enteric tube courses to the left upper quadrant, ste stable side hole at the level of the gastric fundus. Normal cardiac size and mediastinal contours. No pneumothorax, pulmonary edema or pleural effusion. No  consolidation identified. Streaky perihilar opacity is stable and appears to be chronic.  IMPRESSION: 1.  Stable lines and tubes. 2. Stable pulmonary hyperinflation. No acute cardiopulmonary abnormality.   Electronically Signed   By: Augusto GambleLee  Hall M.D.   On: 07/27/2014 07:06   Dg Chest Port 1 View  07/26/2014   CLINICAL DATA:  Pneumonia, cough, congestion, quadriplegia  EXAM: PORTABLE CHEST - 1 VIEW  COMPARISON:  Portable exam 1809 hr compared to 07/26/2014 at 0535 hr  FINDINGS: Tip of endotracheal tube projects 6.9 cm above carina.  Nasogastric tube extends into stomach.  Normal heart size and mediastinal contours.  Emphysematous changes with increased interstitial markings particularly in the perihilar regions and RIGHT upper lobe.  Persistent LEFT lower lobe opacity suspicious for pneumonia.  No pleural effusion or pneumothorax.  IMPRESSION: COPD changes with bronchitic changes and suspect LEFT lower lobe pneumonia, little changed.   Electronically Signed   By: Ulyses SouthwardMark  Boles M.D.   On: 07/26/2014 19:34   Dg Chest Port 1 View  07/26/2014   CLINICAL DATA:  Respiratory failure, ventilated patient.  EXAM: PORTABLE CHEST - 1 VIEW  COMPARISON:  Portable chest x-ray of July 25, 2014  FINDINGS: The lungs are well-expanded. The interstitial markings remain increased bilaterally. Patchy near confluent densities in the lower lobes are more conspicuous today. The heart and pulmonary vascularity are unremarkable. The mediastinum is normal in width. The endotracheal tube tip lies approximately 4.6 cm above the crotch of the carina. The esophagogastric tube tip projects below the inferior margin of the image.  IMPRESSION: COPD with diffusely increased interstitial markings which appears stable. Increasing confluent alveolar densities in the lower lobes are consistent with pneumonia, possibly secondary to aspiration. The support tubesare in appropriate position.   Electronically Signed   By: David  Swazil[place]Jordan   On: 07/26/2014 07:54    Dg Chest Port 1 View  07/25/2014   CLINICAL DATA:  Feeding tube /orogastric tube placement. Initial encounter.  EXAM: PORTABLE CHEST - 1 VIEW  COMPARISON:  Earlier the same date.  FINDINGS: 0950 hr. Endotracheal tube is unchanged in the midtrachea. An enteric tube has been placed, projecting below the diaphragm, tip not visualized. The heart size and mediastinal contours are stable. There is mildly improved aeration of the medial left lung base. The right lung is clear. There is no pleural effusion or pneumothorax.  IMPRESSION: Enteric tube projects below the diaphragm, tip not visualized. Improved left lower lobe airspace disease.   Electronically Signed   By: Roxy HorsemanBill  Veazey M.D.   On: 07/25/2014 10:16   Portable Chest Xray  07/25/2014   CLINICAL DATA:  Endotracheal tube placement.  EXAM: PORTABLE CHEST - 1 VIEW  COMPARISON:  Radiograph earlier this day at 0338 hr  FINDINGS: Portable AP view at 0515 hr: Placement of endotracheal tube, tip 4.5 cm from the carina. The lungs remain hyperinflated. Consolidation in the medial left lower lobe is again seen. Mild increase in bilateral upper lobe perihilar  markings, may reflect cephalization and developing vascular congestion. The heart size is normal. No large pleural effusion or evident pneumothorax.  IMPRESSION: 1. Endotracheal tube 4.5 cm from the carina. 2. Medial left lower lobe consolidation. 3. Increasing perihilar upper lobe markings, may reflect cephalization and developing vascular congestion.   Electronically Signed   By: Rubye Oaks M.D.   On: 07/25/2014 05:32   Dg Chest Port 1 View  07/25/2014   CLINICAL DATA:  Shortness of breath and respiratory distress.  EXAM: PORTABLE CHEST - 1 VIEW  COMPARISON:  Radiographs and CT obtained yesterday.  FINDINGS: The lungs are hyperinflated with underlying emphysema. The medial left lower lobe consolidation on prior CT is not well defined, however questioned increase in density from prior. No new consolidation  in the right lung. The heart size is normal. There is no pulmonary edema. No large pleural effusion or pneumothorax.  IMPRESSION: Medial left lower lobe consolidation, likely mildly increased compared to prior exam. Underlying emphysema again seen.   Electronically Signed   By: Rubye Oaks M.D.   On: 07/25/2014 04:04   Dg Chest Port 1 View  07/24/2014   CLINICAL DATA:  One day history of congestion  EXAM: PORTABLE CHEST - 1 VIEW  COMPARISON:  March 16, 2014  FINDINGS: There is evidence suggesting a degree of underlying emphysematous change. There is no edema or consolidation. Heart size and pulmonary vascularity are normal. No adenopathy. There is wire fixation in the lower cervical spine.  IMPRESSION: Underlying emphysematous change.  No edema or consolidation.   Electronically Signed   By: Bretta Bang M.D.   On: 07/24/2014 10:58    Medical Consultants:  Pulmonary  Other Consultants:  RT PT  Anti-Infectives:   Vanc 2/3 >> 2/4 Zosyn 2/3 >> 2/4 Azithromycin 2/2 >> 2/2 Ceftriaxone 2/2 >> 2/2 Ceftriaxone 2/4 >> 2/6 Zosyn 2/7 >> 07/30/14   Hollice Espy, MD  [facility] Pager [phone]  If 7PM-7AM, please contact night-coverage [email or web address] Password [facility] 08/06/2014, 3:18 PM   LOS: 13 days   HPI/Subjective: No events overnight.   Objective: Filed Vitals:   08/06/14 1200 08/06/14 1320 08/06/14 1414 08/06/14 1440  BP:  91/49    Pulse:  99    Temp:  98.6 F (37 C)    TempSrc:  Oral    Resp:  18    Height:      Weight:      SpO2: 97% 95% 95% 96%    Intake/Output Summary (Last 24 hours) at 08/06/14 1518 Last data filed at 08/06/14 1413  Gross per 24 hour  Intake    443 ml  Output   2150 ml  Net  -1707 ml    Exam:   General:  Venturi mask on. Patient no acute distress  Cardiovascular: Regular rate and normal rhythm, S1, S2  Respiratory: Clear to auscultation bilaterally, no wheezing  Abdomen: Soft, non tender, non distended, hypoactive bowel  sounds  Extremities: No clubbing cyanosis or edema  Data Reviewed: Basic Metabolic Panel:  Recent Labs Lab 07/31/14 0320 08/01/14 0738 08/02/14 0829 08/03/14 0553 08/04/14 0510  NA 139 141 141 141 140  K 3.8 3.7 3.5 3.4* 4.0  CL 98 98 97 100 101  CO2 28 33* 33* 34* 33*  GLUCOSE 92 86 123* 97 94  BUN 17 6 <5* 6 <5*  CREATININE <0.30* <0.30* <0.30* <0.30* <0.30*  CALCIUM 8.8 8.6 8.7 8.1* 8.3*  MG 1.9 1.9  --   --   --  PHOS 3.5 3.7  --   --   --    CBC:  Recent Labs Lab 07/31/14 0320 08/01/14 0738 08/02/14 0829 08/03/14 0553 08/04/14 0510  WBC 4.5 3.3* 6.0 5.2 9.1  NEUTROABS 3.1 2.0  --   --   --   HGB 11.2* 11.2* 13.2 10.9* 11.2*  HCT 35.6* 35.4* 41.1 34.9* 35.3*  MCV 87.9 88.9 87.3 89.0 87.8  PLT 181 200 268 240 257   CBG:  Recent Labs Lab 08/01/14 2052 08/02/14 0009 08/02/14 0415 08/02/14 0849 08/02/14 1148  GLUCAP 94 95 105* 136* 197*    Recent Results (from the past 240 hour(s))  Clostridium Difficile by PCR     Status: None   Collection Time: 07/29/14  1:12 PM  Result Value Ref Range Status   C difficile by pcr NEGATIVE NEGATIVE Final  Stool culture     Status: None   Collection Time: 08/02/14  1:00 AM  Result Value Ref Range Status   Specimen Description STOOL  Final   Special Requests NONE  Final   Culture   Final    ABUNDANT YEAST NO SALMONELLA, SHIGELLA, CAMPYLOBACTER, YERSINIA, OR E.COLI 0157:[id number] ISOLATED Note: REDUCED NORMAL FLORA PRESENT Performed at [employer] [facility]    Report Status 08/06/2014 FINAL  Final     Scheduled Meds: . albuterol  2.5 mg Nebulization Q6H  . baclofen  40 mg Oral QID  . enoxaparin (LOVENOX) injection  30 mg Subcutaneous Q24H  . feeding supplement (ENSURE COMPLETE)  237 mL Oral BID BM  . gabapentin  600 mg Oral TID  . guaiFENesin  1,200 mg Oral BID  . saccharomyces boulardii  250 mg Oral BID  . sodium chloride  3 mL Intravenous Q12H  . Suvorexant  0.5 tablet Oral QHS   Continuous Infusions:      Time spent: 15 minutes

## 2014-08-07 LAB — CREATININE, SERUM: Creatinine, Ser: <0.3 mg/dL — ABNORMAL LOW (ref 0.50–1.10)

## 2014-08-07 MED ORDER — ALPRAZOLAM ER 0.5 MG PO TB24
0.5000 mg | ORAL_TABLET | Freq: Every evening | ORAL | Status: DC | PRN
  Administered 2014-08-07: 0.5 mg via ORAL

## 2014-08-07 MED ORDER — ALPRAZOLAM 0.5 MG PO TABS: 0.5000 mg | ORAL_TABLET | Freq: Every evening | ORAL | Status: DC | PRN

## 2014-08-07 NOTE — Discharge Summary (Signed)
Discharge Summary  [name] Andersen ZOX:[id number] DOB: [birth year]  PCP: Fredirick Maudlin, MD  Admit date: 07/24/2014 Discharge date: 08/07/2014  Time spent: 25 minutes  Recommendations for Outpatient Follow-up:  1. Patient being discharged with home health  Discharge Diagnoses:  Active Hospital Problems   Diagnosis Date Noted  . Acute respiratory failure 04/03/2013  . Pulmonary atelectasis   . DNR (do not resuscitate) 07/31/2014  . Hypoxemia 07/24/2014  . SIRS (systemic inflammatory response syndrome) 07/24/2014  . Acute respiratory failure with hypoxia 07/24/2014  . Bronchitis   . Cough   . Hypokalemia 03/10/2014  . Quadriplegia 04/03/2013  . Protein-calorie malnutrition, severe 04/03/2013    Resolved Hospital Problems   Diagnosis Date Noted Date Resolved  No resolved problems to display.    Discharge Condition: Improved, being discharged home  Diet recommendation: Regular diet with ensure supplementation twice a day  Filed Weights   07/29/14 0500 07/30/14 0500 07/31/14 0500  Weight: 41.9 kg (92 lb 6 oz) 43.6 kg (96 lb 1.9 oz) 43.7 kg (96 lb 5.5 oz)    History of present illness:  [name] is a 63yo woman with quadriplegia since a spinal cord injury in 1986 who presented to [facility] with worsening SOB, non productive cough and wheezing with chills. She was admitted to the ICU for acute respiratory failure and started on Abx, but quickly transitioned off as she was felt to have viral syndrome.  Hospital Course:  She was intubated and successfully extubated on 07/30/14. She has requested to be DNR following only amenable to pressors. She had worsening respiratory status on 08/04/14 which was thought to be due to mucous plugging. She was placed on 48 hours of mucomyst nebs (completed 24 hours so far) and pulmonary vest with good improvement.   Patient started requiring additional oxygen support up to 60% Venturi facemask. Felt to be an additional mucous plug. By midafternoon,  able to be weaned down to 6 L. Diarrhea resolved, C. difficile and 6 stool cultures negative. Patient will continue her home medications.  Procedures:  None  Consultations:  Pulmonary  Discharge Exam: BP 90/56 mmHg  Pulse 85  Temp(Src) 98.2 F (36.8 C) (Oral)  Resp 18  Ht 5\' 4"  (1.626 m)  Wt 43.7 kg (96 lb 5.5 oz)  BMI 16.53 kg/m2  SpO2 96%  General: Alert and oriented, no acute distress Cardiovascular: Regular rate and rhythm, S1-S2 Respiratory: Decreased breath sounds throughout  Discharge Instructions You were cared for by a hospitalist during your hospital stay. If you have any questions about your discharge medications or the care you received while you were in the hospital after you are discharged, you can call the unit and asked to speak with the hospitalist on call if the hospitalist that took care of you is not available. Once you are discharged, your primary care physician will handle any further medical issues. Please note that NO REFILLS for any discharge medications will be authorized once you are discharged, as it is imperative that you return to your primary care physician (or establish a relationship with a primary care physician if you do not have one) for your aftercare needs so that they can reassess your need for medications and monitor your lab values.  Discharge Instructions    Diet - low sodium heart healthy    Complete by:  As directed      Diet - low sodium heart healthy    Complete by:  As directed      Increase activity  slowly    Complete by:  As directed      Increase activity slowly    Complete by:  As directed             Medication List    STOP taking these medications        amoxicillin-clavulanate 500-125 MG per tablet  Commonly known as:  AUGMENTIN      TAKE these medications        albuterol (2.5 MG/3ML) 0.083% nebulizer solution  Commonly known as:  PROVENTIL  Take 2.5 mg by nebulization 4 (four) times daily as needed for  wheezing or shortness of breath.     ALPRAZolam 0.5 MG 24 hr tablet  Commonly known as:  XANAX XR  Take 1 tablet (0.5 mg total) by mouth at bedtime as needed for sleep.     baclofen 20 MG tablet  Commonly known as:  LIORESAL  Take 40 mg by mouth 4 (four) times daily.     BELSOMRA 20 MG Tabs  Generic drug:  Suvorexant  Take 0.5 tablets by mouth daily.     Suvorexant 20 MG Tabs  Commonly known as:  BELSOMRA  Take 0.5 tablets by mouth at bedtime.     gabapentin 600 MG tablet  Commonly known as:  NEURONTIN  Take 600 mg by mouth 3 (three) times daily.     levalbuterol 0.63 MG/3ML nebulizer solution  Commonly known as:  XOPENEX  Take 3 mLs (0.63 mg total) by nebulization every 6 (six) hours as needed for wheezing or shortness of breath.     multivitamin with minerals Tabs tablet  Take 1 tablet by mouth daily.     NUCYNTA 50 MG Tabs tablet  Generic drug:  tapentadol  Take 50 mg by mouth daily as needed for severe pain.     saccharomyces boulardii 250 MG capsule  Commonly known as:  FLORASTOR  Take 1 capsule (250 mg total) by mouth 2 (two) times daily.     temazepam 30 MG capsule  Commonly known as:  RESTORIL  Take 30 mg by mouth at bedtime as needed for sleep.       No Known Allergies     Follow-up Information    Follow up with Fredirick MaudlinHAWKINS,EDWARD L, MD.   Specialty:  Pulmonary Disease   Contact information:   [address] [place] [id number] [phone]       Follow up with Debbora PrestoMAGICK-MYERS, ISKRA, MD.   Specialty:  Internal Medicine   Why:  As needed call my cell phone [phone]   Contact information:   [address] [place] [address] [phone]        The results of significant diagnostics from this hospitalization (including imaging, microbiology, ancillary and laboratory) are listed below for reference.    Significant Diagnostic Studies: Ct Angio Chest Pe W/cm &/or Wo Cm  07/24/2014   CLINICAL DATA:  Hypoxia.   EXAM: CT ANGIOGRAPHY CHEST WITH CONTRAST  TECHNIQUE: Multidetector CT imaging of the chest was performed using the standard protocol during bolus administration of intravenous contrast. Multiplanar CT image reconstructions and MIPs were obtained to evaluate the vascular anatomy.  CONTRAST:  80mL OMNIPAQUE IOHEXOL 350 MG/ML SOLN  COMPARISON:  CT scan of March 10, 2014.  FINDINGS: No pneumothorax is noted. No significant pleural effusion is noted. Consolidation is noted in superior segment of left lower lobe consistent with pneumonia or subsegmental atelectasis. Material is noted within the lower lobe bronchus consistent with  mucous plug or other inflammatory debris. Soft tissue debris is also noted in several lower lobe bronchi on the right side suggesting aspiration. There is no evidence of thoracic aortic dissection or aneurysm. There is no definite evidence of pulmonary embolus. No mediastinal mass or adenopathy is noted. Visualized portion of upper abdomen appears normal.  Review of the MIP images confirms the above findings.  IMPRESSION: No evidence of pulmonary embolus.  Consolidation of the superior segment of left lower lobe is noted consistent with pneumonia or subsegmental atelectasis. The bronchus in this area appears to be occluded suggesting mucous plug or aspirated debris.  Soft tissue debris is also noted within several lower lobe bronchi on the right side suggesting aspiration as well.   Electronically Signed   By: Roque Lias M.D.   On: 07/24/2014 13:33   Dg Chest Portable 1 View  08/05/2014   CLINICAL DATA:  Pulmonary atelectasis  EXAM: PORTABLE CHEST - 1 VIEW  COMPARISON:  08/04/2014  FINDINGS: Left retrocardiac consolidation with air bronchograms. Improved aeration of the lingula and left upper lobe since previous exam. Coarse central interstitial markings in the right lung as before. No pneumothorax. No effusion. Cervical fixation hardware partially seen.  IMPRESSION: 1. Significant  improvement in the left lung atelectasis with residual left retrocardiac consolidation and air bronchograms.   Electronically Signed   By: Corlis Leak M.D.   On: 08/05/2014 09:03   Dg Chest Port 1 View  08/04/2014   CLINICAL DATA:  Dyspnea  EXAM: PORTABLE CHEST - 1 VIEW  COMPARISON:  07/30/2014  FINDINGS: New dense opacification of the left mid and lower chest with volume loss and air bronchograms. There is also left medial apex opacity, suspect additional disease in the upper lobe. The could be superimposed pleural effusion. The right lung is relatively well aerated, with chronic scarring noted at the apex. No cardiomegaly when accounting for shift in left-sided opacity.  IMPRESSION: New extensive collapse of the left lung with superimposed infection not excluded. Consider mucous plug or interval aspiration.   Electronically Signed   By: Marnee Spring M.D.   On: 08/04/2014 11:00   Dg Chest Port 1 View  07/30/2014   CLINICAL DATA:  Pneumonia .  EXAM: PORTABLE CHEST - 1 VIEW  COMPARISON:  07/29/2014 .  FINDINGS: Endotracheal tube and NG tube in good anatomic position. Heart size normal. Right lower lobe infiltrate persists. No pleural effusion or pneumothorax.  IMPRESSION: 1. Lines and tubes in good anatomic position. 2. Persistent right lower lobe infiltrate consistent with pneumonia.   Electronically Signed   By: Maisie Fus  Register   On: 07/30/2014 07:18   Dg Chest Port 1 View  07/29/2014   CLINICAL DATA:  Acute respiratory failure. Community acquired pneumonia.  EXAM: PORTABLE CHEST - 1 VIEW  COMPARISON:  07/28/2014; 07/27/2014; 07/26/2014  FINDINGS: Grossly unchanged cardiac silhouette and mediastinal contours. Stable positioning of support apparatus. Interval development of ill-defined heterogeneous airspace opacities within the medial aspect of the right mid / lower lung. No pleural effusion or pneumothorax. No evidence of edema. No acute osseus abnormalities.  IMPRESSION: 1.  Stable positioning of  support apparatus.  No pneumothorax. 2. Interval development of ill-defined heterogeneous airspace opacities within the right mid/lower lung worrisome for infection and/or aspiration. Continued attention on follow-up is recommended.   Electronically Signed   By: Simonne Come M.D.   On: 07/29/2014 09:06   Dg Chest Port 1 View  07/28/2014   CLINICAL DATA:  Subsequent evaluation of acute  respiratory failure with hypoxia and pneumonia, history of quadriplegia and spinal cord injury  EXAM: PORTABLE CHEST - 1 VIEW  COMPARISON:  07/27/2014  FINDINGS: Unchanged endotracheal tube. Stable orogastric tube. Heart size and vascular pattern normal. Hyperinflation suggests COPD. Stable bilateral bronchitic change and bronchiectasis. No focal parenchymal opacities.  IMPRESSION: Stable support devices and COPD.   Electronically Signed   By: Esperanza Heir M.D.   On: 07/28/2014 09:00   Dg Chest Port 1 View  07/27/2014   CLINICAL DATA:  64 year old female with pneumonia, acute respiratory failure. Initial encounter.  EXAM: PORTABLE CHEST - 1 VIEW  COMPARISON:  07/26/2014 and earlier.  FINDINGS: Portable AP semi upright view at 0431 hrs. Large lung volumes again noted. Endotracheal tube is stable tip just below the clavicles. Enteric tube courses to the left upper quadrant, ste stable side hole at the level of the gastric fundus. Normal cardiac size and mediastinal contours. No pneumothorax, pulmonary edema or pleural effusion. No consolidation identified. Streaky perihilar opacity is stable and appears to be chronic.  IMPRESSION: 1.  Stable lines and tubes. 2. Stable pulmonary hyperinflation. No acute cardiopulmonary abnormality.   Electronically Signed   By: Augusto Gamble M.D.   On: 07/27/2014 07:06   Dg Chest Port 1 View  07/26/2014   CLINICAL DATA:  Pneumonia, cough, congestion, quadriplegia  EXAM: PORTABLE CHEST - 1 VIEW  COMPARISON:  Portable exam 1809 hr compared to 07/26/2014 at 0535 hr  FINDINGS: Tip of endotracheal tube  projects 6.9 cm above carina.  Nasogastric tube extends into stomach.  Normal heart size and mediastinal contours.  Emphysematous changes with increased interstitial markings particularly in the perihilar regions and RIGHT upper lobe.  Persistent LEFT lower lobe opacity suspicious for pneumonia.  No pleural effusion or pneumothorax.  IMPRESSION: COPD changes with bronchitic changes and suspect LEFT lower lobe pneumonia, little changed.   Electronically Signed   By: Ulyses Southward M.D.   On: 07/26/2014 19:34   Dg Chest Port 1 View  07/26/2014   CLINICAL DATA:  Respiratory failure, ventilated patient.  EXAM: PORTABLE CHEST - 1 VIEW  COMPARISON:  Portable chest x-ray of July 25, 2014  FINDINGS: The lungs are well-expanded. The interstitial markings remain increased bilaterally. Patchy near confluent densities in the lower lobes are more conspicuous today. The heart and pulmonary vascularity are unremarkable. The mediastinum is normal in width. The endotracheal tube tip lies approximately 4.6 cm above the crotch of the carina. The esophagogastric tube tip projects below the inferior margin of the image.  IMPRESSION: COPD with diffusely increased interstitial markings which appears stable. Increasing confluent alveolar densities in the lower lobes are consistent with pneumonia, possibly secondary to aspiration. The support tubesare in appropriate position.   Electronically Signed   By: David  [place]   On: 07/26/2014 07:54   Dg Chest Port 1 View  07/25/2014   CLINICAL DATA:  Feeding tube /orogastric tube placement. Initial encounter.  EXAM: PORTABLE CHEST - 1 VIEW  COMPARISON:  Earlier the same date.  FINDINGS: 0950 hr. Endotracheal tube is unchanged in the midtrachea. An enteric tube has been placed, projecting below the diaphragm, tip not visualized. The heart size and mediastinal contours are stable. There is mildly improved aeration of the medial left lung base. The right lung is clear. There is no pleural  effusion or pneumothorax.  IMPRESSION: Enteric tube projects below the diaphragm, tip not visualized. Improved left lower lobe airspace disease.   Electronically Signed   By: Roxy Horseman  M.D.   On: 07/25/2014 10:16   Portable Chest Xray  07/25/2014   CLINICAL DATA:  Endotracheal tube placement.  EXAM: PORTABLE CHEST - 1 VIEW  COMPARISON:  Radiograph earlier this day at 0338 hr  FINDINGS: Portable AP view at 0515 hr: Placement of endotracheal tube, tip 4.5 cm from the carina. The lungs remain hyperinflated. Consolidation in the medial left lower lobe is again seen. Mild increase in bilateral upper lobe perihilar markings, may reflect cephalization and developing vascular congestion. The heart size is normal. No large pleural effusion or evident pneumothorax.  IMPRESSION: 1. Endotracheal tube 4.5 cm from the carina. 2. Medial left lower lobe consolidation. 3. Increasing perihilar upper lobe markings, may reflect cephalization and developing vascular congestion.   Electronically Signed   By: Rubye Oaks M.D.   On: 07/25/2014 05:32   Dg Chest Port 1 View  07/25/2014   CLINICAL DATA:  Shortness of breath and respiratory distress.  EXAM: PORTABLE CHEST - 1 VIEW  COMPARISON:  Radiographs and CT obtained yesterday.  FINDINGS: The lungs are hyperinflated with underlying emphysema. The medial left lower lobe consolidation on prior CT is not well defined, however questioned increase in density from prior. No new consolidation in the right lung. The heart size is normal. There is no pulmonary edema. No large pleural effusion or pneumothorax.  IMPRESSION: Medial left lower lobe consolidation, likely mildly increased compared to prior exam. Underlying emphysema again seen.   Electronically Signed   By: Rubye Oaks M.D.   On: 07/25/2014 04:04   Dg Chest Port 1 View  07/24/2014   CLINICAL DATA:  One day history of congestion  EXAM: PORTABLE CHEST - 1 VIEW  COMPARISON:  March 16, 2014  FINDINGS: There is  evidence suggesting a degree of underlying emphysematous change. There is no edema or consolidation. Heart size and pulmonary vascularity are normal. No adenopathy. There is wire fixation in the lower cervical spine.  IMPRESSION: Underlying emphysematous change.  No edema or consolidation.   Electronically Signed   By: Bretta Bang M.D.   On: 07/24/2014 10:58    Microbiology: Recent Results (from the past 240 hour(s))  Clostridium Difficile by PCR     Status: None   Collection Time: 07/29/14  1:12 PM  Result Value Ref Range Status   C difficile by pcr NEGATIVE NEGATIVE Final  Stool culture     Status: None   Collection Time: 08/02/14  1:00 AM  Result Value Ref Range Status   Specimen Description STOOL  Final   Special Requests NONE  Final   Culture   Final    ABUNDANT YEAST NO SALMONELLA, SHIGELLA, CAMPYLOBACTER, YERSINIA, OR E.COLI 0157:H7 ISOLATED Note: REDUCED NORMAL FLORA PRESENT Performed at [facility]    Report Status 08/06/2014 FINAL  Final     Labs: Basic Metabolic Panel:  Recent Labs Lab 08/01/14 0738 08/02/14 0829 08/03/14 0553 08/04/14 0510 08/07/14 0606  NA 141 141 141 140  --   K 3.7 3.5 3.4* 4.0  --   CL 98 97 100 101  --   CO2 33* 33* 34* 33*  --   GLUCOSE 86 123* 97 94  --   BUN 6 <5* 6 <5*  --   CREATININE <0.30* <0.30* <0.30* <0.30* <0.30*  CALCIUM 8.6 8.7 8.1* 8.3*  --   MG 1.9  --   --   --   --   PHOS 3.7  --   --   --   --  Liver Function Tests: No results for input(s): AST, ALT, ALKPHOS, BILITOT, PROT, ALBUMIN in the last 168 hours. No results for input(s): LIPASE, AMYLASE in the last 168 hours. No results for input(s): AMMONIA in the last 168 hours. CBC:  Recent Labs Lab 08/01/14 0738 08/02/14 0829 08/03/14 0553 08/04/14 0510  WBC 3.3* 6.0 5.2 9.1  NEUTROABS 2.0  --   --   --   HGB 11.2* 13.2 10.9* 11.2*  HCT 35.4* 41.1 34.9* 35.3*  MCV 88.9 87.3 89.0 87.8  PLT 200 268 240 257   Cardiac Enzymes: No results for  input(s): CKTOTAL, CKMB, CKMBINDEX, TROPONINI in the last 168 hours. BNP: BNP (last 3 results) No results for input(s): BNP in the last 8760 hours.  ProBNP (last 3 results)  Recent Labs  03/10/14 1325  PROBNP 114.4    CBG:  Recent Labs Lab 08/01/14 2052 08/02/14 0009 08/02/14 0415 08/02/14 0849 08/02/14 1148  GLUCAP 94 95 105* 136* 197*       Signed:  Demetry Bendickson K  [facility] 08/07/2014, 7:40 PM

## 2014-08-09 ENCOUNTER — Other Ambulatory Visit (HOSPITAL_COMMUNITY): Payer: Self-pay | Admitting: Radiology

## 2014-12-05 IMAGING — CR DG CHEST 1V PORT
1 series · 1 of 1 positions shown · non-contrast
Comparison: 03/18/2007

CLINICAL DATA: Endotracheal tube placement

EXAM:
PORTABLE CHEST - 1 VIEW

[ap portable]
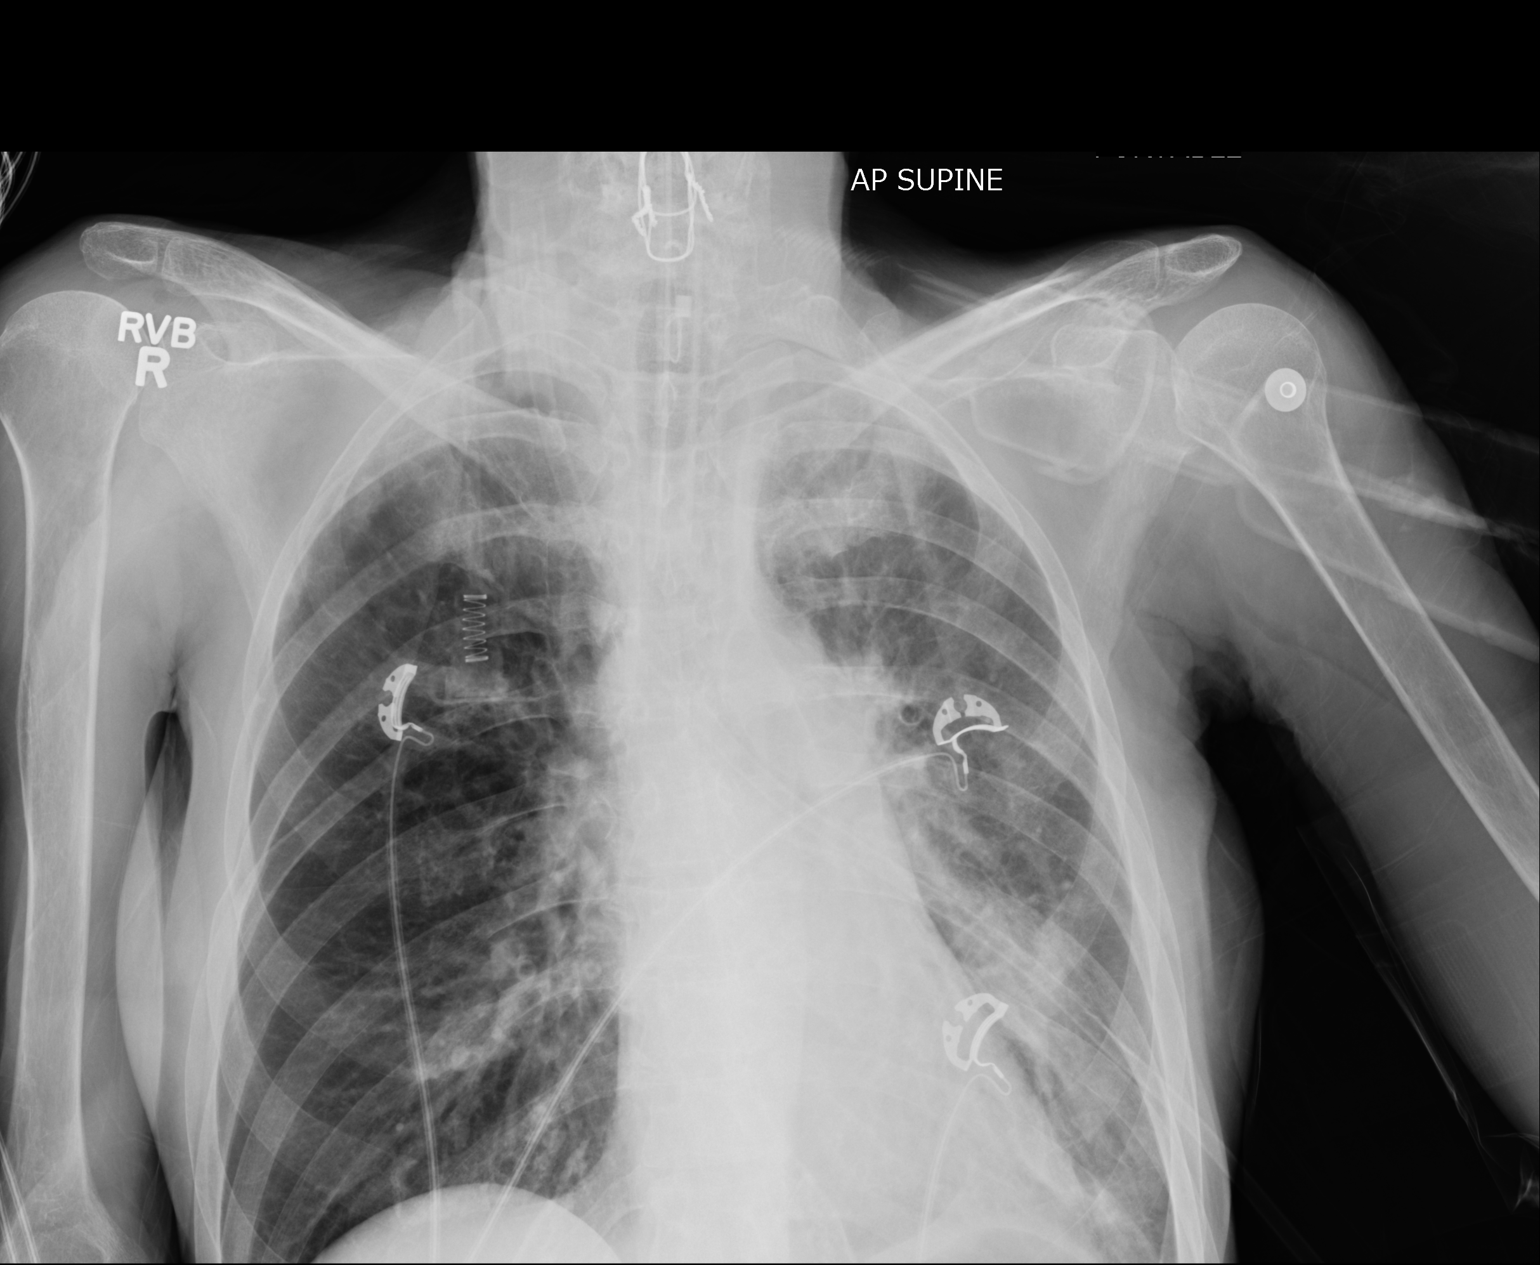

[1 of 1 positions shown; findings below may reference images not displayed]

FINDINGS: Endotracheal tube ends 4 cm above the carina.

COPD with diffuse bronchial wall thickening and hyperinflation.
There is new multi focal left lung opacity with mild volume loss. No
definite effusion or pneumothorax.

Cervical spine cerclage wires are unremarkable in the frontal
projection.
IMPRESSION: 1. Good positioning of endotracheal tube.
2. Multi focal left lung infiltrate, most likely pneumonia.
3. COPD.

## 2015-11-04 ENCOUNTER — Encounter (HOSPITAL_COMMUNITY): Payer: Self-pay

## 2015-11-04 ENCOUNTER — Ambulatory Visit (HOSPITAL_COMMUNITY): Payer: BLUE CROSS/BLUE SHIELD | Attending: Pulmonary Disease

## 2015-11-04 DIAGNOSIS — M6281 Muscle weakness (generalized): Secondary | ICD-10-CM | POA: Diagnosis present

## 2015-11-04 NOTE — Therapy (Addendum)
[name] Andersen [address] [place], [place], [address] Phone: [phone]   Fax:  [phone]  Occupational Therapy Wheelchair Evaluation  Patient Details  Name: [name] Andersen MRN: [id number] Date of Birth: 05/13/1951 Referring Provider: Kari BaarsEdward Hawkins, MD  Encounter Date: 11/04/2015      OT End of Session - 11/04/15 1128    Visit Number 1   Number of Visits 1   Authorization Type medicare    Authorization - Visit Number 1   Authorization - Number of Visits 10   OT Start Time 1037   OT Stop Time 1123   OT Time Calculation (min) 46 min   Activity Tolerance Patient tolerated treatment well   Behavior During Therapy [facility] for tasks assessed/performed      Past Medical History  Diagnosis Date  . Quadriplegia (HCC) 1986  . Spinal cord injury, C5-C7 (HCC) 1986  . Anxiety   . Insomnia   . Acute respiratory failure with hypoxia (HCC)     10/15  . Pressure ulcer     sacryn 10/15  . CAP (community acquired pneumonia)   . Pneumonia "several times"  . On home oxygen therapy     "2L q hs" (07/26/2014)    Past Surgical History  Procedure Laterality Date  . Posterior fusion cervical spine  ~ 1986    C5-C6   . Colonoscopy  8.3.2007    Dr. Karilyn Cotaehman  . Cholecystectomy    . Tonsillectomy    . Vaginal hysterectomy      There were no vitals filed for this visit.         [facility] - 11/04/15 1131    Assessment   Diagnosis wheelchair evaluation   Referring Provider [name] BaarsEdward Hawkins, MD       To Whom It May Concern,    [name] Andersen was seen today in this clinic for a power wheelchair evaluation. [name] Andersen has a past medical history that includes Acute Respiratory Failure, Pulmonary atelectasis, Hypoxemia, SIRS, Hypokalemia, Autonomic dysreflexia, Complete C5-C7 SCI (1986) [name] Andersen has been wheelchair bound and non-ambulatory since sustaining a spinal cord injury in 1986. [name] Andersen has had her present powered wheelchair for  approximately 6 years. [name] Andersen is seeking a replacement chair today as her present one is too large for her size, does not have a recline function and has required several costly repairs.   [name] Andersen lives with her husband in a 1 story house. Her husband works full time and an Engineer, productionaide provides assistance 7 days a week for 4 hours. Services required include bathing, dressing, grooming, toileting, transfers, housekeeping, and meal prep. [name] Andersen is alone for approximately 3 hours every day until her husband gets home from work. Her husband provides total care while he is home until he leaves for work the next morning.      A FULL PHYSICAL ASSESSMENT REVEALS THE FOLLOWING    Existing Equipment:   [name] Andersen owns a powered wheelchair, hoyer lift, and 3-in-1 commode chair which she uses in the shower to bath.  Transfers:  [name] Andersen requires Total Assist for transfers from either the hoyer lift or physically.   Head and Neck:    Limited A/ROM with cervical extension and left/right rotation.  Trunk and Pelvis: No active ROM due to SCI. No muscle activation noted.   Hip: No active ROM due to SCI. No muscle activation noted.  Knees:  No active ROM due to SCI. No muscle activation noted.  Feet and Ankles: No active ROM due to SCI. No muscle activation noted.   Upper Extremities: A/ROM of bilateral shoulders to 75% range.  Shoulder strength: Right and Left: 3-/5; Elbow and wrist strength: Right and Left: 3/5; Impaired gross right hand strength. No active movement in bilateral hands.    Weight Shifting Ability:  [name] Andersen is unable to weight shift independently.   Skin Integrity:  History of skin breakdown. [name] Andersen reports that she presently has a callus on her sacrum that frequently opens up.   Cognition:  WFL  Activity Tolerance: Poor. [name] Andersen is wheelchair bound and non-ambulatory. She is unable to complete any activity requiring fine or gross motor coordination of her bilateral upper  extremities. [name] Andersen has learned adaptive and compensatory techniques to complete basic daily tasks as needed.  GOALS/OBJECTIVE OF SEATING INTERVENTION:  Recommendation: [name] Andersen would benefit from a power wheelchair for use in her home and in the community. She is unable to propel a standard wheelchair due to decreased BUE strength and use. A scooter would not be beneficial for [name]. She does not have the adequate space needed in her home for the wide turning radius required by the scooter.  [name] Andersen is motivated and willing to use her power wheelchair and is physically and mentally able to operate a power wheelchair. A power wheelchair would increase [name] Andersen's safety and independence with daily activities and her quality of life.    For the above diagnoses and positioning/pressure concerns, [name] Andersen recommended that she be provided:  [employer]s ROVI X3 power wheelchair [phone]), fitted to the patient's measurements, with  . Power tilt and recline (E1007) for positioning and pain/pressure relief  . Height adjustable armrests with gel arm troughs (E2209)  . Foam filled (flat-free) drive tires  . Andersen mount power, articulating footplate (U0454) to address LE edema/positioning  . Scissor-actuated elevating seat (E2300) to reach shelves and food preparation surfaces  . R-Net expandable controller (U9811) to drive the chair and control seating functions  . R-Net joystick with display; swing-away mount (B1478) to facilitate transfers  . BodyPoint Mushroom Head joystick know (G9562)  . Harness [phone]) required for expandable controls  . Multiple actuator interface [phone]) to control the chairs seating functions  . Lateral hip guides (N6295) for positioning  . Pelvic strap (M8413) for maintaining seated position  . Positioning back (K4401) to maintain a stable seated position  . Roho Quattro Select adjustable seat cushion [phone]) for positioning and pressure  relief  . Headrest [phone]) with hardware (I3474) to position weak neck/head muscles  . 34NF Batteries [phone]) to power the chair.    Please see [name] Andersen's assessment for justification of need.   If you require any further information concerning [name] Andersen's positioning, independence or mobility needs; or any further information why a lesser device will not work, please do not hesitate to contact me at [facility], [address] [place], [place] [address] [phone].         Plan - 11/04/15 1128    Rehab Potential Excellent   OT Frequency One time visit   OT Treatment/Interventions Patient/family education   Consulted and Agree with Plan of Care Patient      Patient will benefit from skilled therapeutic intervention in order to improve the following deficits and impairments:  Decreased activity tolerance, Decreased mobility, Impaired UE functional use, Decreased strength, Decreased range of motion, Decreased balance, Increased edema, Difficulty walking  Visit Diagnosis: Muscle weakness (generalized)      G-Codes - November 24, 2015 1130    Functional Assessment Tool Used clinical judgement   Functional Limitation Mobility: Walking and moving around   Mobility: Walking and Moving Around Current Status [phone]) 100 percent impaired, limited or restricted   Mobility: Walking and Moving Around Goal Status [phone]) 100 percent impaired, limited or restricted   Mobility: Walking and Moving Around Discharge Status [phone]) 100 percent impaired, limited or restricted      Problem List Patient Active Problem List   Diagnosis Date Noted  . Pulmonary atelectasis   . DNR (do not resuscitate) 07/31/2014  . Hypoxemia 07/24/2014  . SIRS (systemic inflammatory response syndrome) (HCC) 07/24/2014  . Acute respiratory failure with hypoxia (HCC) 07/24/2014  . Bronchitis   . Cough   . Aspiration pneumonia (HCC) 03/10/2014  . Hypokalemia 03/10/2014  . Melena  12/19/2013  . Family hx of colon cancer 12/19/2013  . Atelectasis 04/08/2013  . Quadriplegia (HCC) 04/03/2013  . Spinal cord injury, C5-C7 (HCC) 04/03/2013  . Protein-calorie malnutrition, severe (HCC) 04/03/2013  . Acute respiratory failure (HCC) 04/03/2013  . HCAP (healthcare-associated pneumonia) 04/03/2013    [name] Andersen, OTR/L,CBIS  [phone]  24-Nov-2015, 11:32 AM  [facility] [address] [place], [place], [address] Phone: [phone]   Fax:  [phone]  Name: TAMIAH DYSART MRN: [id number] Date of Birth: 29-Oct-1950

## 2016-03-27 IMAGING — CT CT ANGIO CHEST
1 of 6 series · 5 of 36 positions shown · IV contrast (Omnipaque 300)
Comparison: CT scan of March 10, 2014.

CLINICAL DATA: Hypoxia.

EXAM:
CT ANGIOGRAPHY CHEST WITH CONTRAST
TECHNIQUE: Multidetector CT imaging of the chest was performed using the
standard protocol during bolus administration of intravenous
contrast. Multiplanar CT image reconstructions and MIPs were
obtained to evaluate the vascular anatomy.
CONTRAST:  80mL OMNIPAQUE IOHEXOL 350 MG/ML SOLN

[Series 7: pe 3.0 b40f · axial · 0.54mm/px · z∈[-284,-77]mm · 5 of 105 slices shown]
[im 18/105  lung]
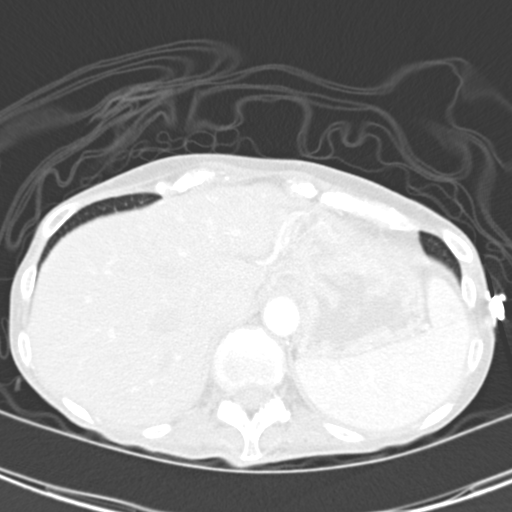
[im 35/105  mediastinal]
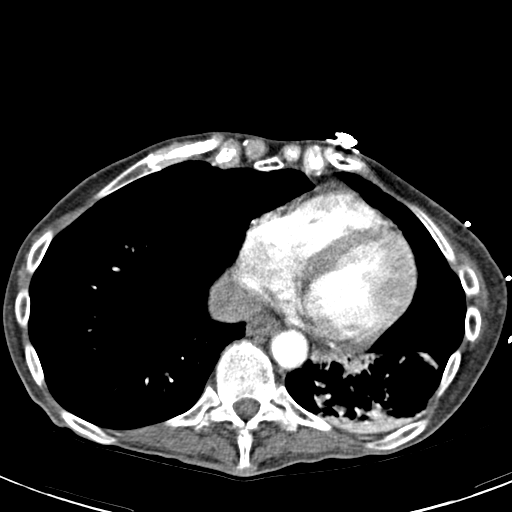
[im 53/105  lung]
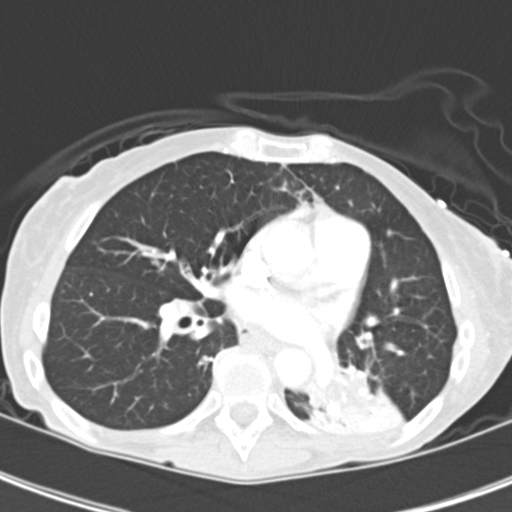
[im 70/105  mediastinal]
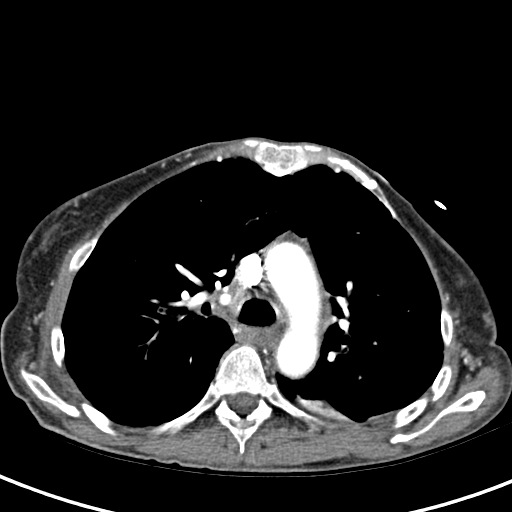
[im 87/105  lung]
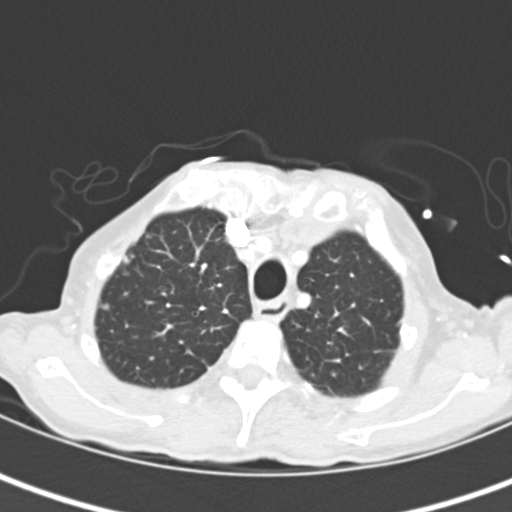

[5 of 36 positions shown; findings below may reference images not displayed]

FINDINGS: No pneumothorax is noted. No significant pleural effusion is noted.
Consolidation is noted in superior segment of left lower lobe
consistent with pneumonia or subsegmental atelectasis. Material is
noted within the lower lobe bronchus consistent with mucous plug or
other inflammatory debris. Soft tissue debris is also noted in
several lower lobe bronchi on the right side suggesting aspiration.
There is no evidence of thoracic aortic dissection or aneurysm.
There is no definite evidence of pulmonary embolus. No mediastinal
mass or adenopathy is noted. Visualized portion of upper abdomen
appears normal.

Review of the MIP images confirms the above findings.
IMPRESSION: No evidence of pulmonary embolus.

Consolidation of the superior segment of left lower lobe is noted
consistent with pneumonia or subsegmental atelectasis. The bronchus
in this area appears to be occluded suggesting mucous plug or
aspirated debris.

Soft tissue debris is also noted within several lower lobe bronchi
on the right side suggesting aspiration as well.

## 2016-03-27 IMAGING — CR DG CHEST 1V PORT
1 series · 1 of 1 positions shown · non-contrast
Comparison: March 16, 2014

CLINICAL DATA: One day history of congestion

EXAM:
PORTABLE CHEST - 1 VIEW

[portable]
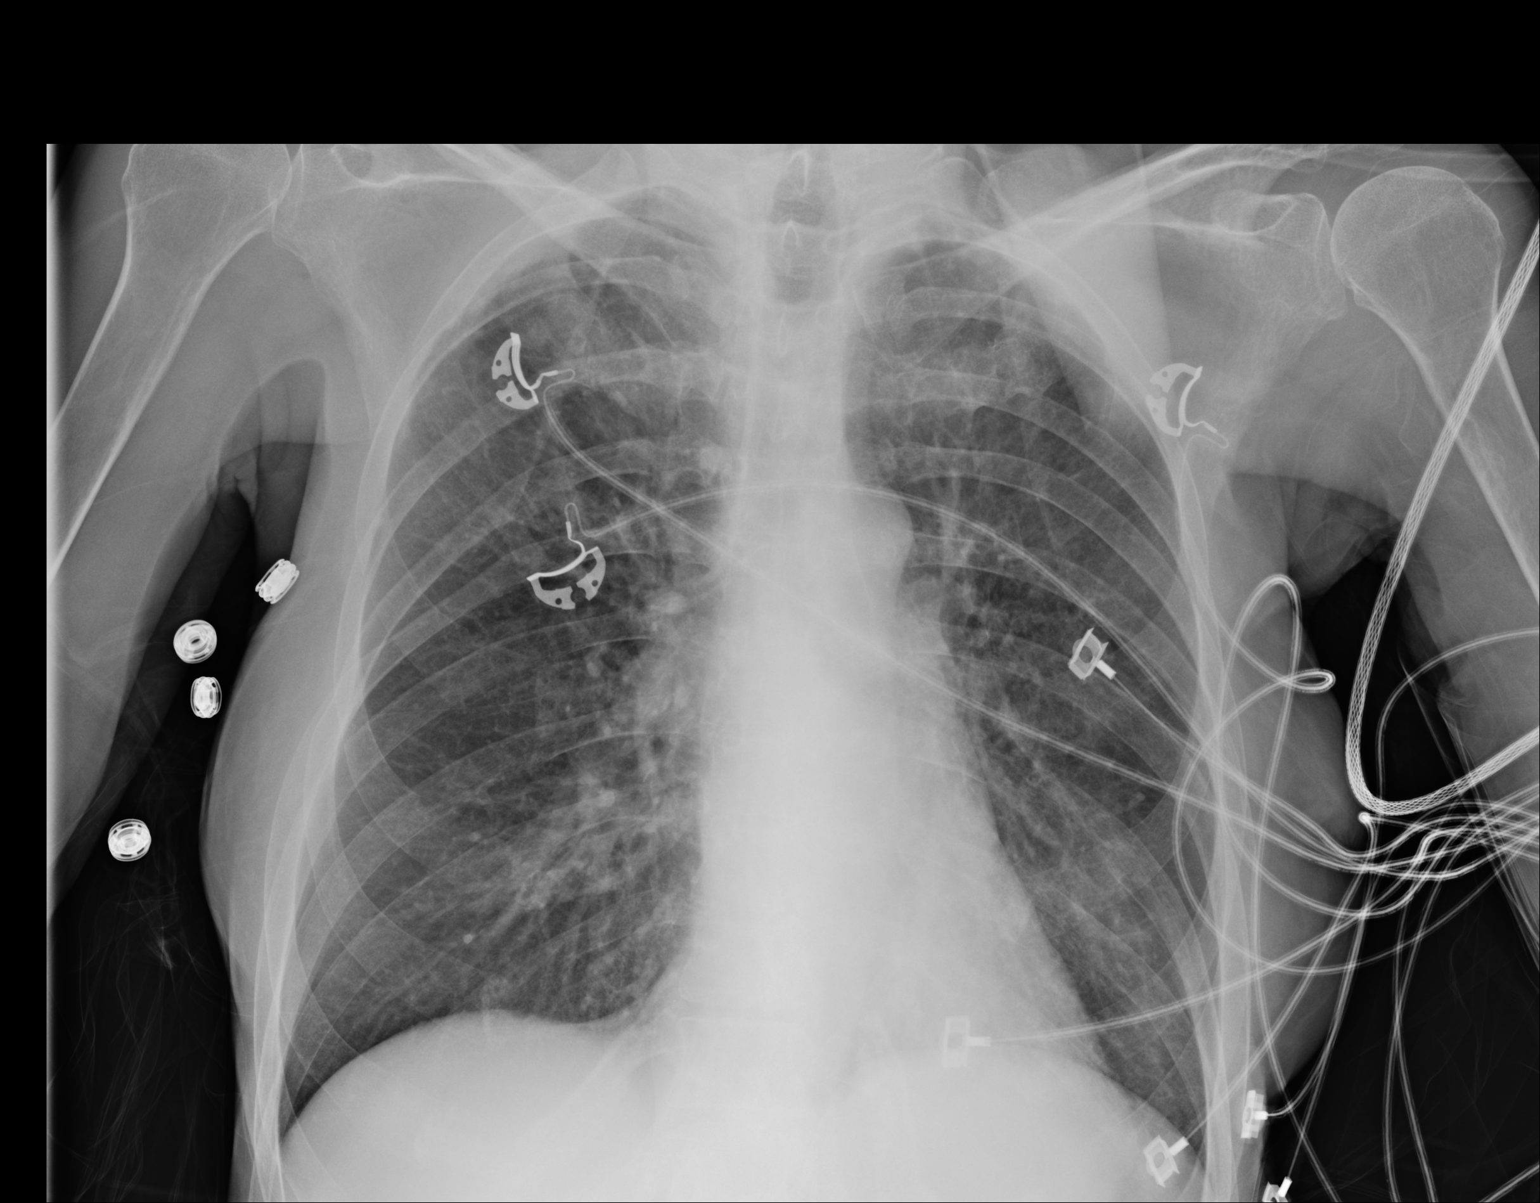

[1 of 1 positions shown; findings below may reference images not displayed]

FINDINGS: There is evidence suggesting a degree of underlying emphysematous
change. There is no edema or consolidation. Heart size and pulmonary
vascularity are normal. No adenopathy. There is wire fixation in the
lower cervical spine.
IMPRESSION: Underlying emphysematous change.  No edema or consolidation.

## 2016-08-04 DIAGNOSIS — G8250 Quadriplegia, unspecified: Secondary | ICD-10-CM | POA: Diagnosis not present

## 2016-08-04 DIAGNOSIS — L97901 Non-pressure chronic ulcer of unspecified part of unspecified lower leg limited to breakdown of skin: Secondary | ICD-10-CM | POA: Diagnosis not present

## 2016-08-04 DIAGNOSIS — E46 Unspecified protein-calorie malnutrition: Secondary | ICD-10-CM | POA: Diagnosis not present

## 2016-08-04 DIAGNOSIS — G4700 Insomnia, unspecified: Secondary | ICD-10-CM | POA: Diagnosis not present

## 2016-09-08 DIAGNOSIS — L98412 Non-pressure chronic ulcer of buttock with fat layer exposed: Secondary | ICD-10-CM | POA: Diagnosis not present

## 2016-09-08 DIAGNOSIS — G8250 Quadriplegia, unspecified: Secondary | ICD-10-CM | POA: Diagnosis not present

## 2016-09-17 DIAGNOSIS — G8250 Quadriplegia, unspecified: Secondary | ICD-10-CM | POA: Diagnosis not present

## 2016-09-17 DIAGNOSIS — L98412 Non-pressure chronic ulcer of buttock with fat layer exposed: Secondary | ICD-10-CM | POA: Diagnosis not present

## 2016-12-25 DIAGNOSIS — Z0000 Encounter for general adult medical examination without abnormal findings: Secondary | ICD-10-CM | POA: Diagnosis not present

## 2017-01-07 DIAGNOSIS — J9610 Chronic respiratory failure, unspecified whether with hypoxia or hypercapnia: Secondary | ICD-10-CM | POA: Diagnosis not present

## 2017-01-07 DIAGNOSIS — G4700 Insomnia, unspecified: Secondary | ICD-10-CM | POA: Diagnosis not present

## 2017-01-07 DIAGNOSIS — G8250 Quadriplegia, unspecified: Secondary | ICD-10-CM | POA: Diagnosis not present

## 2017-01-07 DIAGNOSIS — L97901 Non-pressure chronic ulcer of unspecified part of unspecified lower leg limited to breakdown of skin: Secondary | ICD-10-CM | POA: Diagnosis not present

## 2017-01-07 DIAGNOSIS — M6240 Contracture of muscle, unspecified site: Secondary | ICD-10-CM | POA: Diagnosis not present

## 2017-01-07 DIAGNOSIS — F419 Anxiety disorder, unspecified: Secondary | ICD-10-CM | POA: Diagnosis not present

## 2017-01-07 DIAGNOSIS — E46 Unspecified protein-calorie malnutrition: Secondary | ICD-10-CM | POA: Diagnosis not present

## 2017-01-07 DIAGNOSIS — Z0000 Encounter for general adult medical examination without abnormal findings: Secondary | ICD-10-CM | POA: Diagnosis not present

## 2017-01-14 ENCOUNTER — Telehealth (HOSPITAL_COMMUNITY): Payer: Self-pay | Admitting: Speech Pathology

## 2017-01-14 NOTE — Telephone Encounter (Signed)
Patient called and canceled her appt ,she donot want speech or a MBSS since her insurance wont pay for her to get braces

## 2017-01-18 ENCOUNTER — Encounter (HOSPITAL_COMMUNITY): Payer: Self-pay

## 2017-01-18 ENCOUNTER — Ambulatory Visit (HOSPITAL_COMMUNITY): Payer: Medicare Other | Admitting: Speech Pathology

## 2017-04-02 DIAGNOSIS — Z23 Encounter for immunization: Secondary | ICD-10-CM | POA: Diagnosis not present

## 2017-04-02 DIAGNOSIS — J189 Pneumonia, unspecified organism: Secondary | ICD-10-CM | POA: Diagnosis not present

## 2017-04-02 DIAGNOSIS — G894 Chronic pain syndrome: Secondary | ICD-10-CM | POA: Diagnosis not present

## 2017-04-02 DIAGNOSIS — G4700 Insomnia, unspecified: Secondary | ICD-10-CM | POA: Diagnosis not present

## 2017-04-22 DIAGNOSIS — H5213 Myopia, bilateral: Secondary | ICD-10-CM | POA: Diagnosis not present

## 2017-04-22 DIAGNOSIS — H04123 Dry eye syndrome of bilateral lacrimal glands: Secondary | ICD-10-CM | POA: Diagnosis not present

## 2017-04-22 DIAGNOSIS — H2513 Age-related nuclear cataract, bilateral: Secondary | ICD-10-CM | POA: Diagnosis not present

## 2017-04-22 DIAGNOSIS — H524 Presbyopia: Secondary | ICD-10-CM | POA: Diagnosis not present

## 2017-04-22 DIAGNOSIS — H52221 Regular astigmatism, right eye: Secondary | ICD-10-CM | POA: Diagnosis not present

## 2017-07-01 DIAGNOSIS — G894 Chronic pain syndrome: Secondary | ICD-10-CM | POA: Diagnosis not present

## 2017-07-01 DIAGNOSIS — L89159 Pressure ulcer of sacral region, unspecified stage: Secondary | ICD-10-CM | POA: Diagnosis not present

## 2017-07-01 DIAGNOSIS — J189 Pneumonia, unspecified organism: Secondary | ICD-10-CM | POA: Diagnosis not present

## 2017-07-01 DIAGNOSIS — L89121 Pressure ulcer of left upper back, stage 1: Secondary | ICD-10-CM | POA: Diagnosis not present

## 2017-07-29 DIAGNOSIS — J9610 Chronic respiratory failure, unspecified whether with hypoxia or hypercapnia: Secondary | ICD-10-CM | POA: Diagnosis not present

## 2017-07-29 DIAGNOSIS — E46 Unspecified protein-calorie malnutrition: Secondary | ICD-10-CM | POA: Diagnosis not present

## 2017-07-29 DIAGNOSIS — G8250 Quadriplegia, unspecified: Secondary | ICD-10-CM | POA: Diagnosis not present

## 2017-07-29 DIAGNOSIS — G4700 Insomnia, unspecified: Secondary | ICD-10-CM | POA: Diagnosis not present

## 2017-08-03 ENCOUNTER — Encounter: Payer: Self-pay | Admitting: General Surgery

## 2017-08-03 ENCOUNTER — Ambulatory Visit (INDEPENDENT_AMBULATORY_CARE_PROVIDER_SITE_OTHER): Payer: PPO | Admitting: General Surgery

## 2017-08-03 VITALS — BP 108/73 | HR 60 | Temp 97.7°F | Ht 66.0 in | Wt 105.0 lb

## 2017-08-03 DIAGNOSIS — L89151 Pressure ulcer of sacral region, stage 1: Secondary | ICD-10-CM | POA: Diagnosis not present

## 2017-08-03 NOTE — Progress Notes (Signed)
[name] Andersen; [id number]; 1950-12-22   HPI Patient is a 67 year old quadriplegic white female who was referred to my care by Dr. Juanetta Gosling for evaluation and treatment of a sacral wound.  Patient has been a quadriplegic for many decades now.  She has had issues with the sacral wound and it has been addressed by multiple doctors including plastic surgery, who have not been able to offer anything such as a skin flap or excision.  The primary complaint is that fluid collection in the subcutaneous tissue under the wound which then drains spontaneously.  Is occasionally serous or serosanguineous.  It has been infected in the past but not recently.  The patient's husband has applied glue to try to close one area of an opening.  He states that currently, the wound looks the best it has looked.  No fevers are noted. Past Medical History:  Diagnosis Date  . Acute respiratory failure with hypoxia (HCC)    10/15  . Anxiety   . CAP (community acquired pneumonia)   . Insomnia   . On home oxygen therapy    "2L q hs" (07/26/2014)  . Pneumonia "several times"  . Pressure ulcer    sacryn 10/15  . Quadriplegia (HCC) 1986  . Spinal cord injury, C5-C7 (HCC) 1986    Past Surgical History:  Procedure Laterality Date  . CHOLECYSTECTOMY    . COLONOSCOPY  8.3.2007   Dr. Karilyn Cota  . POSTERIOR FUSION CERVICAL SPINE  ~ 1986   C5-C6   . TONSILLECTOMY    . VAGINAL HYSTERECTOMY      History reviewed. No pertinent family history.  Current Outpatient Medications on File Prior to Visit  Medication Sig Dispense Refill  . albuterol (PROVENTIL) (2.5 MG/3ML) 0.083% nebulizer solution Take 2.5 mg by nebulization 4 (four) times daily as needed for wheezing or shortness of breath.    . ALPRAZolam (XANAX XR) 0.5 MG 24 hr tablet Take 1 tablet (0.5 mg total) by mouth at bedtime as needed for sleep. 30 tablet 0  . baclofen (LIORESAL) 20 MG tablet Take 40 mg by mouth 4 (four) times daily.     . BELSOMRA 20 MG TABS Take 0.5 tablets  by mouth daily.     Marland Kitchen gabapentin (NEURONTIN) 600 MG tablet Take 600 mg by mouth 3 (three) times daily.    Marland Kitchen levalbuterol (XOPENEX) 0.63 MG/3ML nebulizer solution Take 3 mLs (0.63 mg total) by nebulization every 6 (six) hours as needed for wheezing or shortness of breath. 1000 mL 5  . Multiple Vitamin (MULTIVITAMIN WITH MINERALS) TABS tablet Take 1 tablet by mouth daily.    . NUCYNTA 50 MG TABS tablet Take 50 mg by mouth daily as needed for severe pain.     Marland Kitchen saccharomyces boulardii (FLORASTOR) 250 MG capsule Take 1 capsule (250 mg total) by mouth 2 (two) times daily. 60 capsule 1  . Suvorexant (BELSOMRA) 20 MG TABS Take 0.5 tablets by mouth at bedtime. 30 tablet 0  . temazepam (RESTORIL) 30 MG capsule Take 30 mg by mouth at bedtime as needed for sleep.      No current facility-administered medications on file prior to visit.     No Known Allergies  Social History   Substance and Sexual Activity  Alcohol Use Yes   Comment: 07/26/2014 "shot of tequila q couple weeks"    Social History   Tobacco Use  Smoking Status Former Smoker  . Packs/day: 0.50  . Years: 5.00  . Pack years: 2.50  . Types:  Cigarettes  Smokeless Tobacco Never Used  Tobacco Comment   "quit smoking cigarettes in 1980"    Review of Systems  Constitutional: Negative.   HENT: Negative.   Eyes: Negative.   Respiratory: Negative.   Cardiovascular: Negative.   Gastrointestinal: Negative.   Genitourinary: Negative.   Musculoskeletal: Negative.   Neurological: Negative.   Endo/Heme/Allergies: Negative.   Psychiatric/Behavioral: Negative.     Objective   Vitals:   08/03/17 1514  BP: 108/73  Pulse: 60  Temp: 97.7 F (36.5 C)    Physical Exam  Constitutional: She is oriented to person, place, and time and well-developed, well-nourished, and in no distress.  HENT:  Head: Normocephalic and atraumatic.  Cardiovascular: Normal rate and regular rhythm. Exam reveals no gallop and no friction rub.  No murmur  heard. Pulmonary/Chest: Effort normal and breath sounds normal. No respiratory distress. She has no wheezes. She has no rales.  Neurological: She is alert and oriented to person, place, and time.  Skin:  Over the coccyx, healed and scarred area of skin is present with a punctate granuloma present inferiorly and a small 5 mm in length groove of superficial opening present with granulation tissue present.  There is no significant soft tissue beneath the coccyx.  No bone is exposed.  No erythema is noted.  Silver nitrate was applied to the 2 areas that had granulation tissue present.  No purulent drainage was noted.  Vitals reviewed. Patient lying on a motorized wheelchair  Assessment  Healed sacral decubitus ulcer secondary to ongoing pressure, quadriplegia, this has been well taken care of by the family.  The seroma that collapsed subcutaneously is a result of a significant loss of adipose tissue. Plan   I told the patient and her husband that I could not offer a significant surgical procedure to get rid of the space.  This has been already assessed by plastic surgery who stated that they could not offer her anything.  I told them that the fluid collection would continue and may be something that she always has to deal with.  I would like to see her in 3 weeks to see how the granulomas are healing.  They understood and agreed.  Follow-up in 3 weeks.

## 2017-08-24 ENCOUNTER — Ambulatory Visit: Payer: PPO | Admitting: General Surgery

## 2017-09-27 DIAGNOSIS — G4700 Insomnia, unspecified: Secondary | ICD-10-CM | POA: Diagnosis not present

## 2017-09-27 DIAGNOSIS — E46 Unspecified protein-calorie malnutrition: Secondary | ICD-10-CM | POA: Diagnosis not present

## 2017-09-27 DIAGNOSIS — G8250 Quadriplegia, unspecified: Secondary | ICD-10-CM | POA: Diagnosis not present

## 2017-09-27 DIAGNOSIS — J9610 Chronic respiratory failure, unspecified whether with hypoxia or hypercapnia: Secondary | ICD-10-CM | POA: Diagnosis not present

## 2017-10-05 DIAGNOSIS — Z1211 Encounter for screening for malignant neoplasm of colon: Secondary | ICD-10-CM | POA: Diagnosis not present

## 2017-10-05 DIAGNOSIS — Z1212 Encounter for screening for malignant neoplasm of rectum: Secondary | ICD-10-CM | POA: Diagnosis not present

## 2017-11-01 ENCOUNTER — Ambulatory Visit (INDEPENDENT_AMBULATORY_CARE_PROVIDER_SITE_OTHER): Payer: BLUE CROSS/BLUE SHIELD | Admitting: Otolaryngology

## 2017-11-01 DIAGNOSIS — H93293 Other abnormal auditory perceptions, bilateral: Secondary | ICD-10-CM

## 2017-11-15 ENCOUNTER — Encounter (HOSPITAL_COMMUNITY): Payer: Self-pay | Admitting: Emergency Medicine

## 2017-11-15 ENCOUNTER — Inpatient Hospital Stay (HOSPITAL_COMMUNITY): Payer: PPO

## 2017-11-15 ENCOUNTER — Emergency Department (HOSPITAL_COMMUNITY): Payer: PPO

## 2017-11-15 ENCOUNTER — Inpatient Hospital Stay (HOSPITAL_COMMUNITY)
Admission: EM | Admit: 2017-11-15 | Discharge: 2017-11-20 | DRG: 208 | Disposition: E | Payer: PPO | Attending: Pulmonary Disease | Admitting: Pulmonary Disease

## 2017-11-15 DIAGNOSIS — T17890A Other foreign object in other parts of respiratory tract causing asphyxiation, initial encounter: Secondary | ICD-10-CM | POA: Diagnosis not present

## 2017-11-15 DIAGNOSIS — J9600 Acute respiratory failure, unspecified whether with hypoxia or hypercapnia: Secondary | ICD-10-CM

## 2017-11-15 DIAGNOSIS — E43 Unspecified severe protein-calorie malnutrition: Secondary | ICD-10-CM | POA: Diagnosis not present

## 2017-11-15 DIAGNOSIS — Z7189 Other specified counseling: Secondary | ICD-10-CM

## 2017-11-15 DIAGNOSIS — M6258 Muscle wasting and atrophy, not elsewhere classified, other site: Secondary | ICD-10-CM | POA: Diagnosis not present

## 2017-11-15 DIAGNOSIS — J942 Hemothorax: Secondary | ICD-10-CM | POA: Diagnosis not present

## 2017-11-15 DIAGNOSIS — Z79899 Other long term (current) drug therapy: Secondary | ICD-10-CM | POA: Diagnosis not present

## 2017-11-15 DIAGNOSIS — S14105S Unspecified injury at C5 level of cervical spinal cord, sequela: Secondary | ICD-10-CM

## 2017-11-15 DIAGNOSIS — Z9981 Dependence on supplemental oxygen: Secondary | ICD-10-CM | POA: Diagnosis not present

## 2017-11-15 DIAGNOSIS — L89153 Pressure ulcer of sacral region, stage 3: Secondary | ICD-10-CM | POA: Diagnosis not present

## 2017-11-15 DIAGNOSIS — J9622 Acute and chronic respiratory failure with hypercapnia: Secondary | ICD-10-CM | POA: Diagnosis not present

## 2017-11-15 DIAGNOSIS — E876 Hypokalemia: Secondary | ICD-10-CM | POA: Diagnosis present

## 2017-11-15 DIAGNOSIS — Z66 Do not resuscitate: Secondary | ICD-10-CM | POA: Diagnosis present

## 2017-11-15 DIAGNOSIS — T68XXXA Hypothermia, initial encounter: Secondary | ICD-10-CM | POA: Diagnosis not present

## 2017-11-15 DIAGNOSIS — R0902 Hypoxemia: Secondary | ICD-10-CM | POA: Diagnosis not present

## 2017-11-15 DIAGNOSIS — Z515 Encounter for palliative care: Secondary | ICD-10-CM

## 2017-11-15 DIAGNOSIS — Z90710 Acquired absence of both cervix and uterus: Secondary | ICD-10-CM | POA: Diagnosis not present

## 2017-11-15 DIAGNOSIS — Z981 Arthrodesis status: Secondary | ICD-10-CM

## 2017-11-15 DIAGNOSIS — R6510 Systemic inflammatory response syndrome (SIRS) of non-infectious origin without acute organ dysfunction: Secondary | ICD-10-CM | POA: Diagnosis present

## 2017-11-15 DIAGNOSIS — Z87891 Personal history of nicotine dependence: Secondary | ICD-10-CM | POA: Diagnosis not present

## 2017-11-15 DIAGNOSIS — J189 Pneumonia, unspecified organism: Secondary | ICD-10-CM

## 2017-11-15 DIAGNOSIS — G8250 Quadriplegia, unspecified: Secondary | ICD-10-CM | POA: Diagnosis not present

## 2017-11-15 DIAGNOSIS — Z4682 Encounter for fitting and adjustment of non-vascular catheter: Secondary | ICD-10-CM | POA: Diagnosis not present

## 2017-11-15 DIAGNOSIS — J9690 Respiratory failure, unspecified, unspecified whether with hypoxia or hypercapnia: Secondary | ICD-10-CM | POA: Diagnosis not present

## 2017-11-15 DIAGNOSIS — J9601 Acute respiratory failure with hypoxia: Secondary | ICD-10-CM | POA: Diagnosis not present

## 2017-11-15 DIAGNOSIS — Z681 Body mass index (BMI) 19 or less, adult: Secondary | ICD-10-CM | POA: Diagnosis not present

## 2017-11-15 DIAGNOSIS — L89612 Pressure ulcer of right heel, stage 2: Secondary | ICD-10-CM | POA: Diagnosis not present

## 2017-11-15 DIAGNOSIS — J9621 Acute and chronic respiratory failure with hypoxia: Secondary | ICD-10-CM | POA: Diagnosis not present

## 2017-11-15 DIAGNOSIS — J690 Pneumonitis due to inhalation of food and vomit: Principal | ICD-10-CM | POA: Diagnosis present

## 2017-11-15 DIAGNOSIS — S14105A Unspecified injury at C5 level of cervical spinal cord, initial encounter: Secondary | ICD-10-CM | POA: Diagnosis present

## 2017-11-15 LAB — COMPREHENSIVE METABOLIC PANEL
ALBUMIN: 2.5 g/dL — AB (ref 3.5–5.0)
ALT: 13 U/L — AB (ref 14–54)
AST: 17 U/L (ref 15–41)
Alkaline Phosphatase: 77 U/L (ref 38–126)
Anion gap: 9 (ref 5–15)
BUN: 8 mg/dL (ref 6–20)
CHLORIDE: 89 mmol/L — AB (ref 101–111)
CO2: 39 mmol/L — ABNORMAL HIGH (ref 22–32)
Calcium: 8.8 mg/dL — ABNORMAL LOW (ref 8.9–10.3)
GLUCOSE: 139 mg/dL — AB (ref 65–99)
POTASSIUM: 3.6 mmol/L (ref 3.5–5.1)
Sodium: 137 mmol/L (ref 135–145)
Total Bilirubin: 0.6 mg/dL (ref 0.3–1.2)
Total Protein: 6.3 g/dL — ABNORMAL LOW (ref 6.5–8.1)

## 2017-11-15 LAB — LACTIC ACID, PLASMA
Lactic Acid, Venous: 0.7 mmol/L (ref 0.5–1.9)
Lactic Acid, Venous: 0.6 mmol/L (ref 0.5–1.9)

## 2017-11-15 LAB — URINALYSIS, ROUTINE W REFLEX MICROSCOPIC
BILIRUBIN URINE: NEGATIVE
Glucose, UA: NEGATIVE mg/dL
HGB URINE DIPSTICK: NEGATIVE
Ketones, ur: 20 mg/dL — AB
Nitrite: NEGATIVE
Protein, ur: NEGATIVE mg/dL
Specific Gravity, Urine: 1.013 (ref 1.005–1.030)
Trans Epithel, UA: <1
pH: 5 (ref 5.0–8.0)

## 2017-11-15 LAB — BLOOD GAS, ARTERIAL
ACID-BASE EXCESS: 9.5 mmol/L — AB (ref 0.0–2.0)
Bicarbonate: 32.7 mmol/L — ABNORMAL HIGH (ref 20.0–28.0)
Drawn by: 27407
FIO2: 80
LHR: 14 {breaths}/min
O2 SAT: 99.2 %
PEEP/CPAP: 5 cmH2O
PH ART: 7.447 (ref 7.350–7.450)
Patient temperature: 34.6
VT: 340 mL
pCO2 arterial: 49.8 mmHg — ABNORMAL HIGH (ref 32.0–48.0)
pO2, Arterial: 176 mmHg — ABNORMAL HIGH (ref 83.0–108.0)

## 2017-11-15 LAB — MRSA PCR SCREENING: MRSA by PCR: NEGATIVE

## 2017-11-15 LAB — CBC WITH DIFFERENTIAL/PLATELET
BASOS ABS: 0 10*3/uL (ref 0.0–0.1)
Basophils Relative: 0 %
Eosinophils Absolute: 0.1 10*3/uL (ref 0.0–0.7)
Eosinophils Relative: 1 %
HEMATOCRIT: 36.6 % (ref 36.0–46.0)
HEMOGLOBIN: 11.4 g/dL — AB (ref 12.0–15.0)
LYMPHS ABS: 0.8 10*3/uL (ref 0.7–4.0)
LYMPHS PCT: 13 %
MCH: 27.3 pg (ref 26.0–34.0)
MCHC: 31.1 g/dL (ref 30.0–36.0)
MCV: 87.6 fL (ref 78.0–100.0)
Monocytes Absolute: 0.3 10*3/uL (ref 0.1–1.0)
Monocytes Relative: 6 %
NEUTROS ABS: 4.8 10*3/uL (ref 1.7–7.7)
Neutrophils Relative %: 80 %
Platelets: 206 10*3/uL (ref 150–400)
RBC: 4.18 MIL/uL (ref 3.87–5.11)
RDW: 13.1 % (ref 11.5–15.5)
WBC: 6 10*3/uL (ref 4.0–10.5)

## 2017-11-15 LAB — PROTIME-INR
INR: 1.23
Prothrombin Time: 15.4 seconds — ABNORMAL HIGH (ref 11.4–15.2)

## 2017-11-15 LAB — I-STAT CG4 LACTIC ACID, ED: LACTIC ACID, VENOUS: 0.8 mmol/L (ref 0.5–1.9)

## 2017-11-15 LAB — CBG MONITORING, ED: Glucose-Capillary: 131 mg/dL — ABNORMAL HIGH (ref 65–99)

## 2017-11-15 LAB — I-STAT TROPONIN, ED: Troponin i, poc: 0 ng/mL (ref 0.00–0.08)

## 2017-11-15 LAB — STREP PNEUMONIAE URINARY ANTIGEN: Strep Pneumo Urinary Antigen: NEGATIVE

## 2017-11-15 LAB — BRAIN NATRIURETIC PEPTIDE: B Natriuretic Peptide: 116 pg/mL — ABNORMAL HIGH (ref 0.0–100.0)

## 2017-11-15 LAB — TROPONIN I: Troponin I: <0.03 ng/mL (ref <0.03)

## 2017-11-15 LAB — TRIGLYCERIDES: Triglycerides: 62 mg/dL (ref <150)

## 2017-11-15 MED ORDER — SODIUM CHLORIDE 0.9 % IV SOLN
1.0000 g | INTRAVENOUS | Status: DC
  Administered 2017-11-15: 1 g via INTRAVENOUS
  Filled 2017-11-15: qty 10

## 2017-11-15 MED ORDER — CHLORHEXIDINE GLUCONATE 0.12% ORAL RINSE (MEDLINE KIT)
15.0000 mL | Freq: Two times a day (BID) | OROMUCOSAL | Status: DC
  Administered 2017-11-15 – 2017-11-16 (×2): 15 mL via OROMUCOSAL

## 2017-11-15 MED ORDER — ORAL CARE MOUTH RINSE
15.0000 mL | Freq: Four times a day (QID) | OROMUCOSAL | Status: DC
  Administered 2017-11-15 – 2017-11-16 (×3): 15 mL via OROMUCOSAL

## 2017-11-15 MED ORDER — IPRATROPIUM-ALBUTEROL 0.5-2.5 (3) MG/3ML IN SOLN
3.0000 mL | RESPIRATORY_TRACT | Status: DC
  Administered 2017-11-15 – 2017-11-17 (×12): 3 mL via RESPIRATORY_TRACT
  Filled 2017-11-15 (×11): qty 3

## 2017-11-15 MED ORDER — IPRATROPIUM BROMIDE 0.02 % IN SOLN: 0.5000 mg | RESPIRATORY_TRACT | Status: DC

## 2017-11-15 MED ORDER — SODIUM CHLORIDE 0.9 % IV SOLN
500.0000 mg | INTRAVENOUS | Status: DC
  Administered 2017-11-15: 500 mg via INTRAVENOUS
  Filled 2017-11-15: qty 500

## 2017-11-15 MED ORDER — FENTANYL CITRATE (PF) 100 MCG/2ML IJ SOLN
50.0000 ug | INTRAMUSCULAR | Status: DC | PRN
  Administered 2017-11-16 – 2017-11-17 (×2): 50 ug via INTRAVENOUS
  Filled 2017-11-15 (×2): qty 2

## 2017-11-15 MED ORDER — FENTANYL CITRATE (PF) 100 MCG/2ML IJ SOLN: 12.5000 ug | INTRAMUSCULAR | Status: DC | PRN

## 2017-11-15 MED ORDER — FAMOTIDINE IN NACL 20-0.9 MG/50ML-% IV SOLN
20.0000 mg | Freq: Two times a day (BID) | INTRAVENOUS | Status: DC
Start: 2017-11-15 — End: 2017-11-19
  Administered 2017-11-15 – 2017-11-18 (×7): 20 mg via INTRAVENOUS
  Filled 2017-11-15 (×7): qty 50

## 2017-11-15 MED ORDER — FENTANYL CITRATE (PF) 100 MCG/2ML IJ SOLN
6.2500 ug | Freq: Once | INTRAMUSCULAR | Status: AC
  Administered 2017-11-15: 6.5 ug via INTRAVENOUS

## 2017-11-15 MED ORDER — FENTANYL CITRATE (PF) 100 MCG/2ML IJ SOLN
50.0000 ug | INTRAMUSCULAR | Status: DC | PRN
  Administered 2017-11-15 – 2017-11-17 (×2): 50 ug via INTRAVENOUS
  Filled 2017-11-15 (×2): qty 2

## 2017-11-15 MED ORDER — PIPERACILLIN-TAZOBACTAM 3.375 G IVPB
3.3750 g | Freq: Three times a day (TID) | INTRAVENOUS | Status: DC
  Administered 2017-11-15 – 2017-11-18 (×11): 3.375 g via INTRAVENOUS
  Filled 2017-11-15 (×11): qty 50

## 2017-11-15 MED ORDER — ACETAMINOPHEN 160 MG/5ML PO SOLN
650.0000 mg | Freq: Four times a day (QID) | ORAL | Status: DC | PRN
  Administered 2017-11-15 – 2017-11-17 (×4): 650 mg via ORAL
  Filled 2017-11-15 (×4): qty 20.3

## 2017-11-15 MED ORDER — FENTANYL CITRATE (PF) 100 MCG/2ML IJ SOLN: 6.5000 ug | INTRAMUSCULAR | Status: DC | PRN

## 2017-11-15 MED ORDER — PROPOFOL 1000 MG/100ML IV EMUL
0.0000 ug/kg/min | INTRAVENOUS | Status: DC
  Administered 2017-11-15: 20 ug/kg/min via INTRAVENOUS
  Administered 2017-11-15: 40 ug/kg/min via INTRAVENOUS
  Administered 2017-11-15: 30 ug/kg/min via INTRAVENOUS
  Administered 2017-11-15: 5 ug/kg/min via INTRAVENOUS
  Administered 2017-11-15: 45 ug/kg/min via INTRAVENOUS
  Administered 2017-11-16 – 2017-11-17 (×4): 50 ug/kg/min via INTRAVENOUS
  Filled 2017-11-15 (×6): qty 100

## 2017-11-15 MED ORDER — BISACODYL 10 MG RE SUPP: 10.0000 mg | Freq: Every day | RECTAL | Status: DC | PRN

## 2017-11-15 MED ORDER — SODIUM CHLORIDE 0.9 % IV BOLUS
1000.0000 mL | Freq: Once | INTRAVENOUS | Status: AC
  Administered 2017-11-15: 1000 mL via INTRAVENOUS

## 2017-11-15 MED ORDER — VANCOMYCIN HCL IN DEXTROSE 750-5 MG/150ML-% IV SOLN
750.0000 mg | Freq: Once | INTRAVENOUS | Status: DC
  Filled 2017-11-15: qty 150

## 2017-11-15 MED ORDER — ENOXAPARIN SODIUM 30 MG/0.3ML ~~LOC~~ SOLN
30.0000 mg | Freq: Every day | SUBCUTANEOUS | Status: DC
  Administered 2017-11-15 – 2017-11-17 (×3): 30 mg via SUBCUTANEOUS
  Filled 2017-11-15 (×3): qty 0.3

## 2017-11-15 MED ORDER — FENTANYL CITRATE (PF) 100 MCG/2ML IJ SOLN
INTRAMUSCULAR | Status: AC
  Filled 2017-11-15: qty 2

## 2017-11-15 MED ORDER — SODIUM CHLORIDE 0.9 % IV SOLN
INTRAVENOUS | Status: DC
  Administered 2017-11-15 – 2017-11-17 (×3): via INTRAVENOUS

## 2017-11-15 MED ORDER — SODIUM CHLORIDE 0.9 % IV SOLN
0.0000 ug/h | INTRAVENOUS | Status: DC
  Administered 2017-11-15: 25 ug/h via INTRAVENOUS
  Filled 2017-11-15: qty 50

## 2017-11-15 MED ORDER — SODIUM CHLORIDE 0.9 % IV SOLN
INTRAVENOUS | Status: DC
  Administered 2017-11-15: 14:00:00 via INTRAVENOUS

## 2017-11-15 MED ORDER — ALBUTEROL SULFATE (2.5 MG/3ML) 0.083% IN NEBU: 2.5000 mg | INHALATION_SOLUTION | RESPIRATORY_TRACT | Status: DC

## 2017-11-15 NOTE — H&P (Signed)
History and Physical  [name] Andersen ZOX:[id number] DOB: June 05, 1951 DOA: 11/05/2017  Referring physician: Clarene Duke PCP: Kari Baars, MD   Chief Complaint: respiratory distress  Historian(s): Spouse, Records  HPI: [name] Andersen is a 67 y.o. female long-time quadriplegic for more than 30 years has been living at home and cared for by her husband had been dealing with aspiration pneumonia at home for the last week.  The patient has frequent episodes of aspiration pneumonia but usually managed at home fairly well with deep suctioning and occasional courses of antibiotics.  The patient apparently had an event last week with progressive cough and shortness of breath.  Her husband was able to suction a lot of the mucus and had started her on Levaquin several days ago standing prescription.  The patient had a bout of coughing earlier today after husband walked out of the room.  The husband returned to the room to find the patient had blue lips.  He called EMS and the patient's sats were reported to be around 60%.  EMS placed patient on CPAP.  The husband states that the patient is DNR.  He says that his wife has told him multiple times that she did not want to be intubated again.  She has experienced this multiple times and she had decided that she did not want it again.  Unfortunately she was intubated in the emergency department.  The husband is telling me that he wants to have his wife extubated as soon as possible and to use bipap if needed but does not want her to be intubated for any prolonged period of time.  He does not want to escalate care.   She has been started on broad spectrum antibiotics.  She is noted to be hypothermic.  She has soft but stable blood pressures.  She is being admitted to ICU.  Her PCP is Dr. Juanetta Gosling and he will assume care tomorrow.    Review of Systems: Unable to obtain  Past Medical History:  Diagnosis Date  . Acute respiratory failure with hypoxia (HCC)    10/15  .  Anxiety   . CAP (community acquired pneumonia)   . Insomnia   . On home oxygen therapy    "2L q hs" (07/26/2014)  . Pneumonia "several times"  . Pressure ulcer    sacryn 10/15  . Quadriplegia (HCC) 1986  . Spinal cord injury, C5-C7 (HCC) 1986   Past Surgical History:  Procedure Laterality Date  . CHOLECYSTECTOMY    . COLONOSCOPY  8.3.2007   Dr. Karilyn Cota  . POSTERIOR FUSION CERVICAL SPINE  ~ 1986   C5-C6   . TONSILLECTOMY    . VAGINAL HYSTERECTOMY     Social History:  reports that she has quit smoking. Her smoking use included cigarettes. She has a 2.50 pack-year smoking history. She has never used smokeless tobacco. She reports that she drinks alcohol. She reports that she does not use drugs.  No Known Allergies  History reviewed. No pertinent family history.  Prior to Admission medications   Medication Sig Start Date End Date Taking? Authorizing Provider  albuterol (PROVENTIL) (2.5 MG/3ML) 0.083% nebulizer solution Take 2.5 mg by nebulization 4 (four) times daily as needed for wheezing or shortness of breath. 06/13/13  Yes [provider]  ALPRAZolam (XANAX XR) 0.5 MG 24 hr tablet Take 1 tablet (0.5 mg total) by mouth at bedtime as needed for sleep. 08/04/14  Yes Dorothea Ogle, MD  baclofen (LIORESAL) 20 MG tablet  Take 40 mg by mouth 3 (three) times daily.  06/26/13  Yes [provider]  BELSOMRA 20 MG TABS Take 0.5 tablets by mouth daily.  03/05/14  Yes [provider]  diazepam (VALIUM) 10 MG tablet Take 10 mg by mouth daily as needed for anxiety.    Yes [provider]  gabapentin (NEURONTIN) 600 MG tablet Take 600 mg by mouth 3 (three) times daily. 06/26/13  Yes [provider]  Multiple Vitamin (MULTIVITAMIN WITH MINERALS) TABS tablet Take 1 tablet by mouth daily.   Yes [provider]  potassium chloride (K-DUR,KLOR-CON) 10 MEQ tablet Take 10 mEq by mouth daily.   Yes [provider]  saccharomyces boulardii (FLORASTOR)  250 MG capsule Take 1 capsule (250 mg total) by mouth 2 (two) times daily. 08/04/14  Yes Dorothea Ogle, MD  Suvorexant (BELSOMRA) 20 MG TABS Take 0.5 tablets by mouth at bedtime. 08/04/14  Yes Dorothea Ogle, MD  temazepam (RESTORIL) 30 MG capsule Take 30 mg by mouth at bedtime as needed for sleep.  08/27/13  Yes [provider]   Physical Exam: Vitals:   2017-12-12 1100 December 12, 2017 1115 December 12, 2017 1130 12-12-2017 1145  BP: 99/63 105/67 115/72 123/75  Pulse: 64 64 (!) 55 (!) 55  Resp: Temp: (!) 95.2 F (35.1 C) (!) 95 F (35 C) (!) 94.8 F (34.9 C) (!) 94.6 F (34.8 C)  SpO2: 98% 99% 99% 99%  Weight:      Height:         General exam: sedated, emaciated, chronically ill appearing female lying supine on the gurney.  She appears critically ill.  Head, eyes and ENT: Nontraumatic and normocephalic. Pupils equally reacting to light and accommodation. Oral mucosa dry.  Neck: Supple. No JVD, carotid bruit or thyromegaly.  Lymphatics: No lymphadenopathy.  Respiratory system: diffuse coarse breath sounds.  Cardiovascular system: S1 and S2 heard, RRR.   Gastrointestinal system: Abdomen is nondistended, soft and nontender. Normal bowel sounds heard. No organomegaly or masses appreciated.  Central nervous system: sedated on vent. Quadriplegic.   Musculoskeletal/Extremities: severe muscle atrophy bilateral upper and lower extremities.   Psychiatry: sedated.   Labs on Admission:  Basic Metabolic Panel: Recent Labs  Lab Dec 12, 2017 0955  NA 137  K 3.6  CL 89*  CO2 39*  GLUCOSE 139*  BUN 8  CREATININE <0.30*  CALCIUM 8.8*   Liver Function Tests: Recent Labs  Lab Dec 12, 2017 0955  AST 17  ALT 13*  ALKPHOS 77  BILITOT 0.6  PROT 6.3*  ALBUMIN 2.5*   No results for input(s): LIPASE, AMYLASE in the last 168 hours. No results for input(s): AMMONIA in the last 168 hours. CBC: Recent Labs  Lab 12-Dec-2017 0955  WBC 6.0  NEUTROABS 4.8  HGB 11.4*  HCT 36.6  MCV 87.6   PLT 206   Cardiac Enzymes: Recent Labs  Lab Dec 12, 2017 0955  TROPONINI <0.03    BNP (last 3 results) No results for input(s): PROBNP in the last 8760 hours. CBG: Recent Labs  Lab 12/12/2017 0956  GLUCAP 131*    Radiological Exams on Admission: Dg Chest Port 1 View  Result Date: 12/12/2017 CLINICAL DATA:  Hypoxia EXAM: PORTABLE CHEST 1 VIEW COMPARISON:  August 05, 2014 FINDINGS: Endotracheal tube tip is 5.0 cm above the carina. Nasogastric tube tip and side port are in the stomach. No pneumothorax. There is consolidation throughout much of the left lung with volume loss. Right lung is somewhat hyperexpanded clear.  Heart size and pulmonary vascularity are normal. No adenopathy. No bone lesions. IMPRESSION: Tube positions as described without pneumothorax. Consolidation throughout much of the left lung with volume loss. Suspect multifocal pneumonia. There may be a degree of mucous plugging as well. Right lung hyperexpanded but clear. Heart size within normal limits and stable. Electronically Signed   By: Bretta Bang III M.D.   On: 10/25/2017 10:38   Assessment/Plan Principal Problem:   Acute respiratory failure with hypoxia (HCC) Active Problems:   Quadriplegia (HCC)   Spinal cord injury, C5-C7 (HCC)   Protein-calorie malnutrition, severe (HCC)   Aspiration pneumonia (HCC)   Hypoxemia   SIRS (systemic inflammatory response syndrome) (HCC)   DNR (do not resuscitate)   Pressure injury of skin  1. Acute respiratory failure - secondary to aspiration pneumonia - The patient is intubated, mechanically ventilated and sedated.  Unfortunately this is not what the patient wanted.  I discussed this with her husband.  He decided to continue the vent for now but will discuss with Dr. Juanetta Gosling in the morning about withdrawing the ventilator support.  He said that he promised his wife before she was fully sedated that he would get her off the ventilator as soon as possible.  He is ok with  bipap if needed.  The patient has orders for DNR and he does not desire to escalate care in any way.  Continue IV antibiotics and neb treatments.  2. Aspiration pneumonia - Ordered Zosyn IV per pharmacy.   3. Hypothermia - possibly secondary to infection will treat with warming blanket.  Follow.  4. Severe protein calorie malnutrition - will ask dietitian to see in AM for recommendations.  5. Spinal Cord Injury C5-C7 with Quadriplegia - holding all home meds for now.   6. DNR - discussion with husband and confirmed.    DVT Prophylaxis: lovenox Code Status: DNR  Family Communication: husband  Disposition Plan: inpatient treatment in ICU for critical care    Critical Care Time spent: 65 minutes  Standley Dakins, MD [facility] Pager [phone]  If 7PM-7AM, please contact night-coverage [email or web address] Password [email or web address] 11/14/2017, 12:04 PM

## 2017-11-15 NOTE — ED Provider Notes (Signed)
[facility] EMERGENCY DEPARTMENT Provider Note   CSN: [id number] Arrival date & time: 10/30/2017  4098     History   Chief Complaint Chief Complaint  Patient presents with  . Respiratory Distress    HPI [name] Andersen is a 66 y.o. female.  The history is provided by the EMS personnel and the spouse. The history is limited by the condition of the patient (Acuity of condition).  Pt was seen at 0945. Per EMS and pt's family: Family states pt "aspirated" and has been coughing for the past 1 week. They started her "standing prescription of levaquin" several days ago. Pt's spouse states he walked out of pt's room and "came back and her lips were blue." EMS states pt's O2 Sat was "60%" on their arrival to scene. EMS placed CPAP with O2 Sats increasing to "97%." EMS states pt has had poor respiratory effort en route.    Past Medical History:  Diagnosis Date  . Acute respiratory failure with hypoxia (HCC)    10/15  . Anxiety   . CAP (community acquired pneumonia)   . Insomnia   . On home oxygen therapy    "2L q hs" (07/26/2014)  . Pneumonia "several times"  . Pressure ulcer    sacryn 10/15  . Quadriplegia (HCC) 1986  . Spinal cord injury, C5-C7 (HCC) 1986    Patient Active Problem List   Diagnosis Date Noted  . Pulmonary atelectasis   . DNR (do not resuscitate) 07/31/2014  . Hypoxemia 07/24/2014  . SIRS (systemic inflammatory response syndrome) (HCC) 07/24/2014  . Acute respiratory failure with hypoxia (HCC) 07/24/2014  . Bronchitis   . Cough   . Aspiration pneumonia (HCC) 03/10/2014  . Hypokalemia 03/10/2014  . Melena 12/19/2013  . Family hx of colon cancer 12/19/2013  . Atelectasis 04/08/2013  . Quadriplegia (HCC) 04/03/2013  . Spinal cord injury, C5-C7 (HCC) 04/03/2013  . Protein-calorie malnutrition, severe (HCC) 04/03/2013  . Acute respiratory failure (HCC) 04/03/2013  . HCAP (healthcare-associated pneumonia) 04/03/2013    Past Surgical History:  Procedure  Laterality Date  . CHOLECYSTECTOMY    . COLONOSCOPY  8.3.2007   Dr. Karilyn Cota  . POSTERIOR FUSION CERVICAL SPINE  ~ 1986   C5-C6   . TONSILLECTOMY    . VAGINAL HYSTERECTOMY       OB History   None      Home Medications    Prior to Admission medications   Medication Sig Start Date End Date Taking? Authorizing Provider  albuterol (PROVENTIL) (2.5 MG/3ML) 0.083% nebulizer solution Take 2.5 mg by nebulization 4 (four) times daily as needed for wheezing or shortness of breath. 06/13/13  Yes [provider]  ALPRAZolam (XANAX XR) 0.5 MG 24 hr tablet Take 1 tablet (0.5 mg total) by mouth at bedtime as needed for sleep. 08/04/14  Yes Dorothea Ogle, MD  baclofen (LIORESAL) 20 MG tablet Take 40 mg by mouth 3 (three) times daily.  06/26/13  Yes [provider]  BELSOMRA 20 MG TABS Take 0.5 tablets by mouth daily.  03/05/14  Yes [provider]  diazepam (VALIUM) 10 MG tablet Take 10 mg by mouth daily as needed for anxiety.    Yes [provider]  gabapentin (NEURONTIN) 600 MG tablet Take 600 mg by mouth 3 (three) times daily. 06/26/13  Yes [provider]  Multiple Vitamin (MULTIVITAMIN WITH MINERALS) TABS tablet Take 1 tablet by mouth daily.   Yes [provider]  potassium chloride (K-DUR,KLOR-CON) 10 MEQ tablet Take  10 mEq by mouth daily.   Yes [provider]  saccharomyces boulardii (FLORASTOR) 250 MG capsule Take 1 capsule (250 mg total) by mouth 2 (two) times daily. 08/04/14  Yes Dorothea Ogle, MD  Suvorexant (BELSOMRA) 20 MG TABS Take 0.5 tablets by mouth at bedtime. 08/04/14  Yes Dorothea Ogle, MD  temazepam (RESTORIL) 30 MG capsule Take 30 mg by mouth at bedtime as needed for sleep.  08/27/13  Yes [provider]    Family History History reviewed. No pertinent family history.  Social History Social History   Tobacco Use  . Smoking status: Former Smoker    Packs/day: 0.50    Years: 5.00    Pack years: 2.50     Types: Cigarettes  . Smokeless tobacco: Never Used  . Tobacco comment: "quit smoking cigarettes in 1980"  Substance Use Topics  . Alcohol use: Yes    Comment: 07/26/2014 "shot of tequila q couple weeks"  . Drug use: No     Allergies   Patient has no known allergies.   Review of Systems Review of Systems  Unable to perform ROS: Acuity of condition     Physical Exam Updated Vital Signs BP 105/67   Pulse 64   Temp (!) 95 F (35 C)   Resp 14   Ht 5\' 5"  (1.651 m)   Wt 31.8 kg (70 lb)   SpO2 99%   BMI 11.65 kg/m    Patient Vitals for the past 24 hrs:  BP Temp Pulse Resp SpO2 Height Weight  2017/11/29 1115 105/67 (!) 95 F (35 C) 64 14 99 % - -  2017/11/29 1100 99/63 (!) 95.2 F (35.1 C) 64 14 98 % - -  11-29-17 1045 90/60 (!) 95.4 F (35.2 C) 68 14 100 % - -  11-29-2017 1030 (!) 89/58 (!) 95.7 F (35.4 C) 71 12 100 % - -  Nov 29, 2017 1025 94/64 (!) 95.7 F (35.4 C) 77 12 100 % - -  Nov 29, 2017 1020 93/69 (!) 95.4 F (35.2 C) 76 15 100 % - -  Nov 29, 2017 1015 90/60 - 80 14 100 % - -  11-29-17 1008 - - 91 18 98 % - -  11-29-2017 0953 92/67 - (!) 102 (!) 26 95 % - -  11/29/17 4540 - - - - - 5\' 5"  (1.651 m) 31.8 kg (70 lb)     Physical Exam 0945: Physical examination:  Nursing notes reviewed; Vital signs and O2 SAT reviewed;  Constitutional: Thin, frail. In no acute distress; Head:  Normocephalic, atraumatic; Eyes: EOMI, PERRL, No scleral icterus; ENMT: Mouth and pharynx normal, Mucous membranes dry; Neck: Supple, Full range of motion; Cardiovascular: Regular rate and rhythm, No gallop; Respiratory: Breath sounds coarse & equal bilaterally, No wheezes. Minimal respiratory effort/excursion; Chest: No deformity, Movement normal; Abdomen: Soft, Nontender, Nondistended, Normal bowel sounds; Genitourinary: No CVA tenderness; Extremities: Peripheral pulses normal, No deformity, No edema, No calf edema or asymmetry. No deformity..; Neuro: Laying eyes closed. No verbalizations. +quadriplegia per  hx..; Skin: Color normal, Warm, Dry.    ED Treatments / Results  Labs (all labs ordered are listed, but only abnormal results are displayed)   EKG EKG Interpretation  Date/Time:  Monday 29-Nov-2017 09:54:33 EDT Ventricular Rate:  98 PR Interval:    QRS Duration: 97 QT Interval:  351 QTC Calculation: 449 R Axis:   90 Text Interpretation:  Sinus rhythm Right atrial enlargement Borderline right axis deviation Probable left ventricular hypertrophy Nonspecific T abnormalities,  lateral leads Baseline wander When compared with ECG of 04/03/2013 Premature ventricular complexes are no longer Present Confirmed by Samuel Jester [phone]) on 12-05-17 10:53:22 AM   Radiology   Procedures Procedures (including critical care time)   Airway procedure:  Timeout: Pre-procedure timeout not performed due to emergent nature of procedure; Indication: Unresponsive;  Oxygen Saturation: 100 %; Oxygen concentration: 100 %; Preoxygenation: Bag-valve-mask;  Medication: None; Procedure: Suctioning, RSI, Glidescope laryngoscopy, Endotracheal intubation with 7.62mm cuffed endotracheal tube, Bag-valve-tube ventilation, Mechanical ventilation;  Reassessment: Successful intubation, No bleeding or obvious trauma in oral cavity or posterior pharynx. Teeth intact, without obvious trauma. Breath sounds equal bilaterally, No breath sounds heard over stomach, Chest movement symmetrical, CO2 detector color change, Endotracheal tube fogging, Oxygen saturation normal. Post-procedure xray obtained.      Medications Ordered in ED Medications  fentaNYL (SUBLIMAZE) 100 MCG/2ML injection (has no administration in time range)  fentaNYL (SUBLIMAZE) injection 6.5 mcg (has no administration in time range)  cefTRIAXone (ROCEPHIN) 1 g in sodium chloride 0.9 % 100 mL IVPB (1 g Intravenous New Bag/Given 12-05-2017 1107)  azithromycin (ZITHROMAX) 500 mg in sodium chloride 0.9 % 250 mL IVPB (has no administration in time range)    0.9 %  sodium chloride infusion (has no administration in time range)  fentaNYL (SUBLIMAZE) 2,500 mcg in sodium chloride 0.9 % 250 mL (10 mcg/mL) infusion (25 mcg/hr Intravenous New Bag/Given 12-05-17 1125)  vancomycin (VANCOCIN) IVPB 750 mg/150 ml premix (has no administration in time range)  fentaNYL (SUBLIMAZE) injection 6.5 mcg (6.5 mcg Intravenous Given 05-Dec-2017 0959)  sodium chloride 0.9 % bolus 1,000 mL (1,000 mLs Intravenous New Bag/Given December 05, 2017 1017)     Initial Impression / Assessment and Plan / ED Course  I have reviewed the triage vital signs and the nursing notes.  Pertinent labs & imaging results that were available during my care of the patient were reviewed by me and considered in my medical decision making (see chart for details).  MDM Reviewed: previous chart, nursing note and vitals Reviewed previous: labs and ECG Interpretation: labs, ECG and x-ray Total time providing critical care: 30-74 minutes. This excludes time spent performing separately reportable procedures and services. Consults: admitting MD   CRITICAL CARE Performed by: Laray Anger Total critical care time: 45 minutes Critical care time was exclusive of separately billable procedures and treating other patients. Critical care was necessary to treat or prevent imminent or life-threatening deterioration. Critical care was time spent personally by me on the following activities: development of treatment plan with patient and/or surrogate as well as nursing, discussions with consultants, evaluation of patient's response to treatment, examination of patient, obtaining history from patient or surrogate, ordering and performing treatments and interventions, ordering and review of laboratory studies, ordering and review of radiographic studies, pulse oximetry and re-evaluation of patient's condition.  Results for orders placed or performed during the hospital encounter of 12-05-17  Culture, blood (routine  x 2)  Result Value Ref Range   Specimen Description BLOOD RIGHT ANTECUBITAL    Special Requests      BOTTLES DRAWN AEROBIC AND ANAEROBIC Blood Culture adequate volume Performed at [facility], [address] [place], [place] [address]    Culture PENDING    Report Status PENDING   Culture, blood (routine x 2)  Result Value Ref Range   Specimen Description BLOOD LEFT ANTECUBITAL    Special Requests      BOTTLES DRAWN AEROBIC ONLY Blood Culture results may not be optimal due to an inadequate volume  of blood received in culture bottles Performed at [facility], [address] [place], [place] [address]    Culture PENDING    Report Status PENDING   Comprehensive metabolic panel  Result Value Ref Range   Sodium 137 135 - 145 mmol/L   Potassium 3.6 3.5 - 5.1 mmol/L   Chloride 89 (L) 101 - 111 mmol/L   CO2 39 (H) 22 - 32 mmol/L   Glucose, Bld 139 (H) 65 - 99 mg/dL   BUN 8 6 - 20 mg/dL   Creatinine, Ser <6.04 (L) 0.44 - 1.00 mg/dL   Calcium 8.8 (L) 8.9 - 10.3 mg/dL   Total Protein 6.3 (L) 6.5 - 8.1 g/dL   Albumin 2.5 (L) 3.5 - 5.0 g/dL   AST 17 15 - 41 U/L   ALT 13 (L) 14 - 54 U/L   Alkaline Phosphatase 77 38 - 126 U/L   Total Bilirubin 0.6 0.3 - 1.2 mg/dL   GFR calc non Af Amer NOT CALCULATED >60 mL/min   GFR calc Af Amer NOT CALCULATED >60 mL/min   Anion gap 9 5 - 15  Brain natriuretic peptide  Result Value Ref Range   B Natriuretic Peptide 116.0 (H) 0.0 - 100.0 pg/mL  Troponin I  Result Value Ref Range   Troponin I <0.03 <0.03 ng/mL  Lactic acid, plasma  Result Value Ref Range   Lactic Acid, Venous 0.7 0.5 - 1.9 mmol/L  CBC with Differential  Result Value Ref Range   WBC 6.0 4.0 - 10.5 K/uL   RBC 4.18 3.87 - 5.11 MIL/uL   Hemoglobin 11.4 (L) 12.0 - 15.0 g/dL   HCT 54.0 98.1 - 19.1 %   MCV 87.6 78.0 - 100.0 fL   MCH 27.3 26.0 - 34.0 pg   MCHC 31.1 30.0 - 36.0 g/dL   RDW 47.8 29.5 - 62.1 %   Platelets 206 150 - 400 K/uL   Neutrophils Relative % 80 %   Neutro Abs  4.8 1.7 - 7.7 K/uL   Lymphocytes Relative 13 %   Lymphs Abs 0.8 0.7 - 4.0 K/uL   Monocytes Relative 6 %   Monocytes Absolute 0.3 0.1 - 1.0 K/uL   Eosinophils Relative 1 %   Eosinophils Absolute 0.1 0.0 - 0.7 K/uL   Basophils Relative 0 %   Basophils Absolute 0.0 0.0 - 0.1 K/uL  Protime-INR  Result Value Ref Range   Prothrombin Time 15.4 (H) 11.4 - 15.2 seconds   INR 1.23   CBG monitoring, ED  Result Value Ref Range   Glucose-Capillary 131 (H) 65 - 99 mg/dL  I-Stat CG4 Lactic Acid, ED  Result Value Ref Range   Lactic Acid, Venous 0.80 0.5 - 1.9 mmol/L  I-stat troponin, ED  Result Value Ref Range   Troponin i, poc 0.00 0.00 - 0.08 ng/mL   Comment 3           Dg Chest Port 1 View Result Date: 11/11/2017 CLINICAL DATA:  Hypoxia EXAM: PORTABLE CHEST 1 VIEW COMPARISON:  August 05, 2014 FINDINGS: Endotracheal tube tip is 5.0 cm above the carina. Nasogastric tube tip and side port are in the stomach. No pneumothorax. There is consolidation throughout much of the left lung with volume loss. Right lung is somewhat hyperexpanded clear. Heart size and pulmonary vascularity are normal. No adenopathy. No bone lesions. IMPRESSION: Tube positions as described without pneumothorax. Consolidation throughout much of the left lung with volume loss. Suspect multifocal pneumonia. There may be a degree of mucous  plugging as well. Right lung hyperexpanded but clear. Heart size within normal limits and stable. Electronically Signed   By: Bretta Bang III M.D.   On: 10/27/2017 10:38    1000:  Pt's husband has arrived to ED: states pt is DNR and he "has the papers at home," states he did NOT give them to EMS or bring them to ED "because if you didn't put the breathing tube in she was going to die." I explained that since there was no paperwork or instructions to EMS regarding pt's wishes, ETT was placed on her arrival to ED for respiratory distress. Pt's husband then states that "it was OK," "she was blue  and going to die at home without the tube being put in," so he called 911; also states "maybe she might change her mind" (regarding intubation).  Pt's husband does state NOT to escalate current treatments (ie: IVF, IV abx, labs, CXR), and to make pt DNR (ie: no CPR, no ACLS, etc). DNR placed.   1110:  IV abx started for CAP. Pt's BP per baseline on file. IV fentanyl boluses and gtt started for sedation. T/C returned from [facility]. Laural Benes, case discussed, including:  HPI, pertinent PM/SHx, VS/PE, dx testing, ED course and treatment:  Agreeable to come to ED for evaluation for admission.      Final Clinical Impressions(s) / ED Diagnoses   Final diagnoses:  None    ED Discharge Orders    None       Samuel Jester, DO 11/20/17 1610

## 2017-11-15 NOTE — Progress Notes (Signed)
Pt transported on vent to ICU.

## 2017-11-15 NOTE — ED Triage Notes (Signed)
Patient brought in by EMS in respiratory distress. Patient was on C-pap upon arrival. Per EMS, patient O2 saturation 60% on room air upon arrival. Placed on C-pap, O2 saturation increased to 97%.

## 2017-11-15 NOTE — ED Notes (Signed)
Family members removed warming blanket, OG tube not connected to int. Suction-OG connected to intermittent suction by this nurse.  Noted core temp within range and warming blanket removed.

## 2017-11-15 NOTE — Progress Notes (Signed)
Pharmacy Note:  Initial antibiotic(s) regimen of Vancomycin ordered by EDP to treat PNA.  CrCl cannot be calculated (This lab value cannot be used to calculate CrCl because it is not a number: <0.30).   No Known Allergies  Vitals:   11/01/2017 1025 10/27/2017 1030  BP: 94/64 (!) 89/58  Pulse: 77 71  Resp: 12 12  Temp: (!) 95.7 F (35.4 C) (!) 95.7 F (35.4 C)  SpO2: 100% 100%    Anti-infectives (From admission, onward)   Start     Dose/Rate Route Frequency Ordered Stop   11/12/2017 1100  cefTRIAXone (ROCEPHIN) 1 g in sodium chloride 0.9 % 100 mL IVPB     1 g 200 mL/hr over 30 Minutes Intravenous Every 24 hours 10/25/2017 1045 11/22/17 1059   10/20/2017 1100  azithromycin (ZITHROMAX) 500 mg in sodium chloride 0.9 % 250 mL IVPB     500 mg 250 mL/hr over 60 Minutes Intravenous Every 24 hours 10/26/2017 1045 11/22/17 1059   11/01/2017 1100  vancomycin (VANCOCIN) IVPB 750 mg/150 ml premix     750 mg 150 mL/hr over 60 Minutes Intravenous  Once 10/30/2017 1048        Antimicrobials this admission:  Vanc 5/27 >>  Rocephin 5/27 >>  Azith 5/27 >>  Dose adjustments this admission:    Microbiology results:   BCx:   UCx:    Sputum:    MRSA PCR:    Plan: Initial dose(s) of Vancomycin X 1 ordered. F/U admission orders for further dosing if therapy continued.  Mady Gemma, [facility] 11/12/2017 10:48 AM

## 2017-11-15 NOTE — ED Notes (Signed)
Bear hugger warming blanket placed over pt., pt with eyes open

## 2017-11-15 NOTE — Progress Notes (Signed)
Pharmacy Antibiotic Note  [name] Andersen is a 67 y.o. female admitted on 10/23/2017 with aspiration pneumonia.  Pharmacy has been consulted for Zosyn dosing.  Plan: Zosyn 3.375g IV q8h (4 hour infusion).  Height:  (165.1 cm) Weight: 70 lb (31.8 kg) IBW/kg (Calculated) : 57  Temp (24hrs), Avg:95.2 F (35.1 C), Min:94.6 F (34.8 C), Max:95.7 F (35.4 C)  Recent Labs  Lab 10/29/2017 0955 11/02/2017 1002 11/05/2017 1027  WBC 6.0  --   --   CREATININE <0.30*  --   --   LATICACIDVEN  --  0.80 0.7    CrCl cannot be calculated (This lab value cannot be used to calculate CrCl because it is not a number: <0.30).    No Known Allergies  Antimicrobials this admission: Zosyn 5/27 >>  Vanc 5/27 x 1 Rocephin 5/27 x 1 Azith 5/27 x 1  Dose adjustments this admission: n/a   Microbiology results: 5/27 BCx: pending  UCx:    Sputum:    MRSA PCR:   Thank you for allowing pharmacy to be a part of this patient's care.  Mady Gemma 10/25/2017 12:11 PM

## 2017-11-16 ENCOUNTER — Other Ambulatory Visit: Payer: Self-pay

## 2017-11-16 ENCOUNTER — Inpatient Hospital Stay (HOSPITAL_COMMUNITY): Payer: PPO

## 2017-11-16 ENCOUNTER — Encounter (HOSPITAL_COMMUNITY): Admission: EM | Disposition: E | Payer: Self-pay | Source: Home / Self Care | Attending: Pulmonary Disease

## 2017-11-16 LAB — COMPREHENSIVE METABOLIC PANEL
ALT: 12 U/L — ABNORMAL LOW (ref 14–54)
ANION GAP: 12 (ref 5–15)
AST: 12 U/L — ABNORMAL LOW (ref 15–41)
Albumin: 2.3 g/dL — ABNORMAL LOW (ref 3.5–5.0)
Alkaline Phosphatase: 68 U/L (ref 38–126)
BILIRUBIN TOTAL: 1.3 mg/dL — AB (ref 0.3–1.2)
BUN: 7 mg/dL (ref 6–20)
CHLORIDE: 94 mmol/L — AB (ref 101–111)
CO2: 30 mmol/L (ref 22–32)
Calcium: 8 mg/dL — ABNORMAL LOW (ref 8.9–10.3)
Creatinine, Ser: <0.3 mg/dL — ABNORMAL LOW (ref 0.44–1.00)
Glucose, Bld: 81 mg/dL (ref 65–99)
POTASSIUM: 2.9 mmol/L — AB (ref 3.5–5.1)
Sodium: 136 mmol/L (ref 135–145)
TOTAL PROTEIN: 5.7 g/dL — AB (ref 6.5–8.1)

## 2017-11-16 LAB — CBC WITH DIFFERENTIAL/PLATELET
BASOS ABS: 0 10*3/uL (ref 0.0–0.1)
Basophils Relative: 0 %
EOS PCT: 0 %
Eosinophils Absolute: 0 10*3/uL (ref 0.0–0.7)
HEMATOCRIT: 31.6 % — AB (ref 36.0–46.0)
Hemoglobin: 10 g/dL — ABNORMAL LOW (ref 12.0–15.0)
LYMPHS PCT: 6 %
Lymphs Abs: 0.5 10*3/uL — ABNORMAL LOW (ref 0.7–4.0)
MCH: 26.9 pg (ref 26.0–34.0)
MCHC: 31.6 g/dL (ref 30.0–36.0)
MCV: 84.9 fL (ref 78.0–100.0)
MONO ABS: 0.5 10*3/uL (ref 0.1–1.0)
MONOS PCT: 6 %
NEUTROS ABS: 6.7 10*3/uL (ref 1.7–7.7)
Neutrophils Relative %: 88 %
PLATELETS: 218 10*3/uL (ref 150–400)
RBC: 3.72 MIL/uL — ABNORMAL LOW (ref 3.87–5.11)
RDW: 13.3 % (ref 11.5–15.5)
WBC: 7.6 10*3/uL (ref 4.0–10.5)

## 2017-11-16 LAB — BASIC METABOLIC PANEL
ANION GAP: 14 (ref 5–15)
BUN: <5 mg/dL — ABNORMAL LOW (ref 6–20)
CALCIUM: 8 mg/dL — AB (ref 8.9–10.3)
CO2: 28 mmol/L (ref 22–32)
Chloride: 97 mmol/L — ABNORMAL LOW (ref 101–111)
Creatinine, Ser: <0.3 mg/dL — ABNORMAL LOW (ref 0.44–1.00)
GLUCOSE: 86 mg/dL (ref 65–99)
POTASSIUM: 2.4 mmol/L — AB (ref 3.5–5.1)
Sodium: 139 mmol/L (ref 135–145)

## 2017-11-16 LAB — MAGNESIUM: MAGNESIUM: 1.4 mg/dL — AB (ref 1.7–2.4)

## 2017-11-16 SURGERY — BRONCHOSCOPY, FLEXIBLE
Anesthesia: Monitor Anesthesia Care

## 2017-11-16 MED ORDER — CHLORHEXIDINE GLUCONATE 0.12% ORAL RINSE (MEDLINE KIT)
15.0000 mL | Freq: Two times a day (BID) | OROMUCOSAL | Status: DC
  Administered 2017-11-16 – 2017-11-17 (×3): 15 mL via OROMUCOSAL

## 2017-11-16 MED ORDER — POTASSIUM CHLORIDE 20 MEQ/15ML (10%) PO SOLN
40.0000 meq | Freq: Every day | ORAL | Status: DC
  Administered 2017-11-16 – 2017-11-17 (×2): 40 meq
  Filled 2017-11-16 (×2): qty 30

## 2017-11-16 MED ORDER — POTASSIUM CHLORIDE 10 MEQ/100ML IV SOLN
10.0000 meq | INTRAVENOUS | Status: AC
  Administered 2017-11-16 – 2017-11-17 (×6): 10 meq via INTRAVENOUS
  Filled 2017-11-16 (×6): qty 100

## 2017-11-16 MED ORDER — ACETYLCYSTEINE 20 % IN SOLN
3.0000 mL | RESPIRATORY_TRACT | Status: DC
  Administered 2017-11-16 – 2017-11-17 (×8): 3 mL via RESPIRATORY_TRACT
  Filled 2017-11-16 (×8): qty 4

## 2017-11-16 MED ORDER — ORAL CARE MOUTH RINSE
15.0000 mL | OROMUCOSAL | Status: DC
  Administered 2017-11-16 – 2017-11-17 (×11): 15 mL via OROMUCOSAL

## 2017-11-16 MED ORDER — LIDOCAINE HCL (PF) 2 % IJ SOLN
INTRAMUSCULAR | Status: AC
  Filled 2017-11-16: qty 20

## 2017-11-16 MED ORDER — LIDOCAINE VISCOUS HCL 2 % MT SOLN
OROMUCOSAL | Status: AC
  Filled 2017-11-16: qty 15

## 2017-11-16 MED ORDER — SODIUM CHLORIDE 0.9 % IV BOLUS
250.0000 mL | Freq: Once | INTRAVENOUS | Status: AC
  Administered 2017-11-16: 250 mL via INTRAVENOUS

## 2017-11-16 MED ORDER — IBUPROFEN 100 MG/5ML PO SUSP
400.0000 mg | Freq: Once | ORAL | Status: AC
  Administered 2017-11-16: 400 mg
  Filled 2017-11-16: qty 20

## 2017-11-16 NOTE — Progress Notes (Signed)
New order for Advil x1 dose- given. Will continue to monitor pts fever

## 2017-11-16 NOTE — Progress Notes (Signed)
Subjective: She is here with aspiration pneumonia.  She was intubated and placed on the ventilator.  This happened before her husband got to the emergency department and could tell staff there that she had DNR and did not want to be intubated.  Chest x-ray yesterday after intubation still shows substantial mucus plugging with essentially totally white out of left lung.  I have personally reviewed that film.  She is awake and able to nod and shake her head we discussed bronchoscopy.  She is not sure she wants to do it.  Objective: Vital signs in last 24 hours: Temp:  [94.3 F (34.6 C)-100 F (37.8 C)] 99.9 F (37.7 C) (05/28 0645) Pulse Rate:  [55-102] 86 (05/28 0645) Resp:  [11-26] 17 (05/28 0645) BP: (84-133)/(52-108) 104/68 (05/28 0645) SpO2:  [93 %-100 %] 96 % (05/28 0755) FiO2 (%):  [40 %-70 %] 50 % (05/28 0755) Weight:  [31.8 kg (70 lb)-38.4 kg (84 lb 10.5 oz)] 38.4 kg (84 lb 10.5 oz) (05/28 0500) Weight change:     Intake/Output from previous day: 05/27 0701 - 05/28 0700 In: 2523.9 [I.V.:973.9; IV Piggyback:1550] Out: 450 [Urine:450]  PHYSICAL EXAM General appearance: alert, cooperative and mild distress Resp: Her chest is actually clear Cardio: regular rate and rhythm, S1, S2 normal, no murmur, click, rub or gallop GI: soft, non-tender; bowel sounds normal; no masses,  no organomegaly Extremities: Very thin She has quadriplegia with some use of her arms Lab Results:  Results for orders placed or performed during the hospital encounter of 10/30/2017 (from the past 48 hour(s))  Comprehensive metabolic panel     Status: Abnormal   Collection Time: 10/26/2017  9:55 AM  Result Value Ref Range   Sodium 137 135 - 145 mmol/L   Potassium 3.6 3.5 - 5.1 mmol/L   Chloride 89 (L) 101 - 111 mmol/L   CO2 39 (H) 22 - 32 mmol/L   Glucose, Bld 139 (H) 65 - 99 mg/dL   BUN 8 6 - 20 mg/dL   Creatinine, Ser <0.30 (L) 0.44 - 1.00 mg/dL   Calcium 8.8 (L) 8.9 - 10.3 mg/dL   Total Protein 6.3  (L) 6.5 - 8.1 g/dL   Albumin 2.5 (L) 3.5 - 5.0 g/dL   AST 17 15 - 41 U/L   ALT 13 (L) 14 - 54 U/L   Alkaline Phosphatase 77 38 - 126 U/L   Total Bilirubin 0.6 0.3 - 1.2 mg/dL   GFR calc non Af Amer NOT CALCULATED >60 mL/min   GFR calc Af Amer NOT CALCULATED >60 mL/min    Comment: (NOTE) The eGFR has been calculated using the CKD EPI equation. This calculation has not been validated in all clinical situations. eGFR's persistently <60 mL/min signify possible Chronic Kidney Disease.    Anion gap 9 5 - 15    Comment: Performed at [facility], [address] [place], [place] [address]  Brain natriuretic peptide     Status: Abnormal   Collection Time: 10/31/2017  9:55 AM  Result Value Ref Range   B Natriuretic Peptide 116.0 (H) 0.0 - 100.0 pg/mL    Comment: Performed at [facility], [address] [place], [place] [address]  Troponin I     Status: None   Collection Time: 10/28/2017  9:55 AM  Result Value Ref Range   Troponin I <0.03 <0.03 ng/mL    Comment: Performed at [facility], [address] [place], [place] [address]  CBC with Differential     Status: Abnormal  Collection Time: 11/02/2017  9:55 AM  Result Value Ref Range   WBC 6.0 4.0 - 10.5 K/uL   RBC 4.18 3.87 - 5.11 MIL/uL   Hemoglobin 11.4 (L) 12.0 - 15.0 g/dL   HCT 36.6 36.0 - 46.0 %   MCV 87.6 78.0 - 100.0 fL   MCH 27.3 26.0 - 34.0 pg   MCHC 31.1 30.0 - 36.0 g/dL   RDW 13.1 11.5 - 15.5 %   Platelets 206 150 - 400 K/uL   Neutrophils Relative % 80 %   Neutro Abs 4.8 1.7 - 7.7 K/uL   Lymphocytes Relative 13 %   Lymphs Abs 0.8 0.7 - 4.0 K/uL   Monocytes Relative 6 %   Monocytes Absolute 0.3 0.1 - 1.0 K/uL   Eosinophils Relative 1 %   Eosinophils Absolute 0.1 0.0 - 0.7 K/uL   Basophils Relative 0 %   Basophils Absolute 0.0 0.0 - 0.1 K/uL    Comment: Performed at [facility], [address], [place], [place] [address]  Protime-INR     Status: Abnormal   Collection Time: 11/04/2017  9:55 AM  Result Value Ref Range    Prothrombin Time 15.4 (H) 11.4 - 15.2 seconds   INR 1.23     Comment: Performed at [facility], [address], [place], [place] [address]  CBG monitoring, ED     Status: Abnormal   Collection Time: 11/17/2017  9:56 AM  Result Value Ref Range   Glucose-Capillary 131 (H) 65 - 99 mg/dL  I-stat troponin, ED     Status: None   Collection Time: 10/23/2017  9:59 AM  Result Value Ref Range   Troponin i, poc 0.00 0.00 - 0.08 ng/mL   Comment 3            Comment: Due to the release kinetics of cTnI, a negative result within the first hours of the onset of symptoms does not rule out myocardial infarction with certainty. If myocardial infarction is still suspected, repeat the test at appropriate intervals.   I-Stat CG4 Lactic Acid, ED     Status: None   Collection Time: 10/25/2017 10:02 AM  Result Value Ref Range   Lactic Acid, Venous 0.80 0.5 - 1.9 mmol/L  Culture, blood (routine x 2)     Status: None (Preliminary result)   Collection Time: 10/25/2017 10:08 AM  Result Value Ref Range   Specimen Description BLOOD RIGHT ANTECUBITAL    Special Requests      BOTTLES DRAWN AEROBIC AND ANAEROBIC Blood Culture adequate volume   Culture      NO GROWTH < 24 HOURS Performed at [facility], [address], [place], [place] [address]    Report Status PENDING   Culture, blood (routine x 2)     Status: None (Preliminary result)   Collection Time: 10/20/2017 10:27 AM  Result Value Ref Range   Specimen Description BLOOD LEFT ANTECUBITAL    Special Requests      BOTTLES DRAWN AEROBIC ONLY Blood Culture results may not be optimal due to an inadequate volume of blood received in culture bottles   Culture      NO GROWTH < 24 HOURS Performed at [facility], [address], [place], [place] [address]    Report Status PENDING   Lactic acid, plasma     Status: None   Collection Time: 11/03/2017 10:27 AM  Result Value Ref Range   Lactic Acid, Venous 0.7 0.5 - 1.9 mmol/L    Comment: Performed at [employer] [facility], [address], [place], [place] [address]  Urinalysis, Routine w reflex microscopic     Status: Abnormal   Collection Time: 11/11/2017 11:21 AM  Result Value Ref Range   Color, Urine YELLOW YELLOW   APPearance HAZY (A) CLEAR   Specific Gravity, Urine 1.013 1.005 - 1.030   pH 5.0 5.0 - 8.0   Glucose, UA NEGATIVE NEGATIVE mg/dL   Hgb urine dipstick NEGATIVE NEGATIVE   Bilirubin Urine NEGATIVE NEGATIVE   Ketones, ur 20 (A) NEGATIVE mg/dL   Protein, ur NEGATIVE NEGATIVE mg/dL   Nitrite NEGATIVE NEGATIVE   Leukocytes, UA SMALL (A) NEGATIVE   RBC / HPF 6-10 0 - 5 RBC/hpf   WBC, UA 11-20 0 - 5 WBC/hpf   Bacteria, UA FEW (A) NONE SEEN   Trans Epithel, UA <1    WBC Clumps PRESENT    Mucus PRESENT    Budding Yeast PRESENT    Hyaline Casts, UA PRESENT     Comment: Performed at [facility], [address], [place], [place] [address]  Lactic acid, plasma     Status: None   Collection Time: 10/28/2017 11:53 AM  Result Value Ref Range   Lactic Acid, Venous 0.6 0.5 - 1.9 mmol/L    Comment: Performed at [facility], [address], [place], [place] [address]  Draw ABG 1 hour after initiation of ventilator     Status: Abnormal   Collection Time: 11/14/2017 11:57 AM  Result Value Ref Range   FIO2 80.00    Delivery systems VENTILATOR    Mode PRESSURE REGULATED VOLUME CONTROL    VT 340 mL   LHR 14 resp/min   Peep/cpap 5.0 cm H20   pH, Arterial 7.447 7.350 - 7.450   pCO2 arterial 49.8 (H) 32.0 - 48.0 mmHg   pO2, Arterial 176 (H) 83.0 - 108.0 mmHg   Bicarbonate 32.7 (H) 20.0 - 28.0 mmol/L   Acid-Base Excess 9.5 (H) 0.0 - 2.0 mmol/L   O2 Saturation 99.2 %   Patient temperature 34.6    Collection site RIGHT BRACHIAL    Drawn by [id number]    Sample type ARTERIAL DRAW    Allens test (pass/fail) PASS PASS    Comment: Performed at [facility], [address], [place], [place] [address]  Triglycerides     Status: None   Collection Time: 10/27/2017  2:56 PM  Result Value Ref Range   Triglycerides 62  <150 mg/dL    Comment: Performed at [facility], [address], [place], [place] [address]  Strep pneumoniae urinary antigen     Status: None   Collection Time: 10/20/2017  4:50 PM  Result Value Ref Range   Strep Pneumo Urinary Antigen NEGATIVE NEGATIVE    Comment:        Infection due to S. pneumoniae cannot be absolutely ruled out since the antigen present may be below the detection limit of the test. Performed at [facility], [address] [place], [place] [address]   MRSA PCR Screening     Status: None   Collection Time: 11/02/2017  5:04 PM  Result Value Ref Range   MRSA by PCR NEGATIVE NEGATIVE    Comment:        The GeneXpert MRSA Assay (FDA approved for NASAL specimens only), is one component of a comprehensive MRSA colonization surveillance program. It is not intended to diagnose MRSA infection nor to guide or monitor treatment for MRSA infections. Performed at [facility], [address],  [place], [place] [address]   CBC WITH DIFFERENTIAL     Status: Abnormal   Collection Time: 10/23/2017  6:49 AM  Result Value Ref Range   WBC 7.6 4.0 - 10.5 K/uL   RBC 3.72 (L) 3.87 - 5.11 MIL/uL   Hemoglobin 10.0 (L) 12.0 - 15.0 g/dL   HCT 31.6 (L) 36.0 - 46.0 %   MCV 84.9 78.0 - 100.0 fL   MCH 26.9 26.0 - 34.0 pg   MCHC 31.6 30.0 - 36.0 g/dL   RDW 13.3 11.5 - 15.5 %   Platelets 218 150 - 400 K/uL   Neutrophils Relative % 88 %   Neutro Abs 6.7 1.7 - 7.7 K/uL   Lymphocytes Relative 6 %   Lymphs Abs 0.5 (L) 0.7 - 4.0 K/uL   Monocytes Relative 6 %   Monocytes Absolute 0.5 0.1 - 1.0 K/uL   Eosinophils Relative 0 %   Eosinophils Absolute 0.0 0.0 - 0.7 K/uL   Basophils Relative 0 %   Basophils Absolute 0.0 0.0 - 0.1 K/uL    Comment: Performed at [facility], [address], [place], [place] [address]  Comprehensive metabolic panel     Status: Abnormal   Collection Time: 11/01/2017  6:49 AM  Result Value Ref Range   Sodium 136 135 - 145 mmol/L   Potassium 2.9 (L) 3.5 -  5.1 mmol/L   Chloride 94 (L) 101 - 111 mmol/L   CO2 30 22 - 32 mmol/L   Glucose, Bld 81 65 - 99 mg/dL   BUN 7 6 - 20 mg/dL   Creatinine, Ser <0.30 (L) 0.44 - 1.00 mg/dL   Calcium 8.0 (L) 8.9 - 10.3 mg/dL   Total Protein 5.7 (L) 6.5 - 8.1 g/dL   Albumin 2.3 (L) 3.5 - 5.0 g/dL   AST 12 (L) 15 - 41 U/L   ALT 12 (L) 14 - 54 U/L   Alkaline Phosphatase 68 38 - 126 U/L   Total Bilirubin 1.3 (H) 0.3 - 1.2 mg/dL   GFR calc non Af Amer NOT CALCULATED >60 mL/min   GFR calc Af Amer NOT CALCULATED >60 mL/min    Comment: (NOTE) The eGFR has been calculated using the CKD EPI equation. This calculation has not been validated in all clinical situations. eGFR's persistently <60 mL/min signify possible Chronic Kidney Disease.    Anion gap 12 5 - 15    Comment: Performed at [facility], [address] [place], [place] [address]  Magnesium     Status: Abnormal   Collection Time: 11/14/2017  6:49 AM  Result Value Ref Range   Magnesium 1.4 (L) 1.7 - 2.4 mg/dL    Comment: Performed at [facility], [address] [place], [place] [address]    ABGS Recent Labs    10/20/2017 1157  PHART 7.447  PO2ART 176*  HCO3 32.7*   CULTURES Recent Results (from the past 240 hour(s))  Culture, blood (routine x 2)     Status: None (Preliminary result)   Collection Time: 11/14/2017 10:08 AM  Result Value Ref Range Status   Specimen Description BLOOD RIGHT ANTECUBITAL  Final   Special Requests   Final    BOTTLES DRAWN AEROBIC AND ANAEROBIC Blood Culture adequate volume   Culture   Final    NO GROWTH < 24 HOURS Performed at [facility], [address], [place], [place] [address]    Report Status PENDING  Incomplete  Culture, blood (routine x 2)     Status: None (Preliminary result)  Collection Time: 11/13/2017 10:27 AM  Result Value Ref Range Status   Specimen Description BLOOD LEFT ANTECUBITAL  Final   Special Requests   Final    BOTTLES DRAWN AEROBIC ONLY Blood Culture results may not be optimal due to an  inadequate volume of blood received in culture bottles   Culture   Final    NO GROWTH < 24 HOURS Performed at [facility], [address] [place], [place] [address]    Report Status PENDING  Incomplete  MRSA PCR Screening     Status: None   Collection Time: 10/20/2017  5:04 PM  Result Value Ref Range Status   MRSA by PCR NEGATIVE NEGATIVE Final    Comment:        The GeneXpert MRSA Assay (FDA approved for NASAL specimens only), is one component of a comprehensive MRSA colonization surveillance program. It is not intended to diagnose MRSA infection nor to guide or monitor treatment for MRSA infections. Performed at [facility], [address], [place], [place] [address]    Studies/Results: Dg Chest 1v Repeat Same Day  Result Date: 10/23/2017 CLINICAL DATA:  Pt intubated and quadriplegic. Same day follow up cxr for aspiration. EXAM: CHEST - 1 VIEW SAME DAY COMPARISON:  Earlier film of the same day FINDINGS: Interval opacification of the left hemithorax with volume loss. Right lung mildly hyperinflated. Endotracheal tube and nasogastric tube remain in good position. Cholecystectomy clips. Heart size difficult to assess due to adjacent opacities. There is no right-sided effusion or pneumothorax. Visualized bones unremarkable. IMPRESSION: 1. Opacification of left hemithorax and volume loss, suggesting central airway occlusion possibly secondary to mucous plugging. Can't exclude concordant left pleural effusion. 2.  Support hardware stable in position. Electronically Signed   By: Lucrezia Europe M.D.   On: 10/20/2017 12:35   Dg Chest Port 1 View  Result Date: 11/11/2017 CLINICAL DATA:  Hypoxia EXAM: PORTABLE CHEST 1 VIEW COMPARISON:  August 05, 2014 FINDINGS: Endotracheal tube tip is 5.0 cm above the carina. Nasogastric tube tip and side port are in the stomach. No pneumothorax. There is consolidation throughout much of the left lung with volume loss. Right lung is somewhat hyperexpanded clear.  Heart size and pulmonary vascularity are normal. No adenopathy. No bone lesions. IMPRESSION: Tube positions as described without pneumothorax. Consolidation throughout much of the left lung with volume loss. Suspect multifocal pneumonia. There may be a degree of mucous plugging as well. Right lung hyperexpanded but clear. Heart size within normal limits and stable. Electronically Signed   By: Lowella Grip III M.D.   On: 11/12/2017 10:38    Medications:  Prior to Admission:  Medications Prior to Admission  Medication Sig Dispense Refill Last Dose  . albuterol (PROVENTIL) (2.5 MG/3ML) 0.083% nebulizer solution Take 2.5 mg by nebulization 4 (four) times daily as needed for wheezing or shortness of breath.   11/14/2017 at Unknown time  . ALPRAZolam (XANAX XR) 0.5 MG 24 hr tablet Take 1 tablet (0.5 mg total) by mouth at bedtime as needed for sleep. 30 tablet 0 Past Week at Unknown time  . baclofen (LIORESAL) 20 MG tablet Take 40 mg by mouth 3 (three) times daily.    11/02/2017 at Unknown time  . BELSOMRA 20 MG TABS Take 0.5 tablets by mouth daily.    11/14/2017 at Unknown time  . diazepam (VALIUM) 10 MG tablet Take 10 mg by mouth daily as needed for anxiety.    unknown  . gabapentin (NEURONTIN) 600 MG tablet Take 600  mg by mouth 3 (three) times daily.   11/05/2017 at Unknown time  . Multiple Vitamin (MULTIVITAMIN WITH MINERALS) TABS tablet Take 1 tablet by mouth daily.   11/06/2017 at Unknown time  . potassium chloride (K-DUR,KLOR-CON) 10 MEQ tablet Take 10 mEq by mouth daily.   11/12/2017 at Unknown time  . saccharomyces boulardii (FLORASTOR) 250 MG capsule Take 1 capsule (250 mg total) by mouth 2 (two) times daily. 60 capsule 1 11/14/2017 at Unknown time  . Suvorexant (BELSOMRA) 20 MG TABS Take 0.5 tablets by mouth at bedtime. 30 tablet 0 11/14/2017 at Unknown time  . temazepam (RESTORIL) 30 MG capsule Take 30 mg by mouth at bedtime as needed for sleep.    11/14/2017 at Unknown time   Scheduled: .  acetylcysteine  3 mL Nebulization Q4H  . chlorhexidine gluconate (MEDLINE KIT)  15 mL Mouth Rinse BID  . enoxaparin (LOVENOX) injection  30 mg Subcutaneous Daily  . ipratropium-albuterol  3 mL Nebulization Q4H  . mouth rinse  15 mL Mouth Rinse 10 times per day   Continuous: . sodium chloride 60 mL/hr at 10/25/2017 0600  . famotidine (PEPCID) IV Stopped (11/02/2017 2202)  . piperacillin-tazobactam (ZOSYN)  IV 3.375 g (10/28/2017 0343)  . propofol (DIPRIVAN) infusion 45 mcg/kg/min (11/04/2017 0600)   QJJ:HERDEYCXKGYJE (TYLENOL) oral liquid 160 mg/5 mL, bisacodyl, fentaNYL (SUBLIMAZE) injection, fentaNYL (SUBLIMAZE) injection  Assesment: She was admitted with acute hypoxic respiratory failure.  She has had spinal cord injury at C5 C7 and is quadriplegic from that.  She has aspiration pneumonia.  She had systemic inflammatory response syndrome which seems better.  She is still requiring 60% oxygen.  She is still critically ill  I think she has some element of mucus plugging.  She is trying to decide whether she would undergo bronchoscopy or not  She does have DNR status so we are trying to figure out what to do about her being on the ventilator.  Her husband is in favor of her continuing at least for now and she indicated that she wants to think about it  She has severe protein calorie malnutrition which is an ongoing issue Principal Problem:   Acute respiratory failure with hypoxia ([place]) Active Problems:   Quadriplegia ([place])   Spinal cord injury, C5-C7 ([place])   Protein-calorie malnutrition, severe ([place])   Aspiration pneumonia ([place])   Hypoxemia   SIRS (systemic inflammatory response syndrome) ([place])   DNR (do not resuscitate)   Pressure injury of skin    Plan: Chest x-ray today.  Possible bronchoscopy.  Possible extubation.    LOS: 1 day   Yandiel Bergum L 11/07/2017, 8:21 AM

## 2017-11-16 NOTE — Progress Notes (Signed)
eLink Physician-Brief Progress Note Patient Name: [name] Andersen DOB: 02-14-1951 MRN: [id number]   Date of Service  10/28/2017  HPI/Events of Note  K+ this AM = 2.9 and reportedly not replaced. This result is now over 12 hours old.   eICU Interventions  Will order: 1. BMP STAT.  K+ replacement guided by BMP result.      Intervention Category Major Interventions: Electrolyte abnormality - evaluation and management  Sommer,Steven Eugene 10/29/2017, 7:25 PM

## 2017-11-16 NOTE — Progress Notes (Signed)
Merdis Delay NP, paged d/t pts temperature of 100.5. Pt was given Tylenol @ 1940 with temperature increasing. Waiting for orders/call back. Will continue to monitor pt

## 2017-11-16 NOTE — Progress Notes (Signed)
CRITICAL VALUE ALERT  Critical Value:  Potassium 2.4  Date & Time Notied:  11/07/2017 @ 2120   Provider Notified: Schorr, NP  Orders Received/Actions taken: Waiting for orders/call back.

## 2017-11-16 NOTE — Progress Notes (Signed)
Pts husband given Alphabet board to help with better communication with pt. AC gave to RN.

## 2017-11-17 ENCOUNTER — Encounter (HOSPITAL_COMMUNITY): Payer: Self-pay | Admitting: Primary Care

## 2017-11-17 ENCOUNTER — Inpatient Hospital Stay (HOSPITAL_COMMUNITY): Payer: PPO

## 2017-11-17 DIAGNOSIS — Z7189 Other specified counseling: Secondary | ICD-10-CM

## 2017-11-17 DIAGNOSIS — Z515 Encounter for palliative care: Secondary | ICD-10-CM

## 2017-11-17 DIAGNOSIS — G8250 Quadriplegia, unspecified: Secondary | ICD-10-CM

## 2017-11-17 DIAGNOSIS — J9601 Acute respiratory failure with hypoxia: Secondary | ICD-10-CM

## 2017-11-17 LAB — HIV ANTIBODY (ROUTINE TESTING W REFLEX): HIV Screen 4th Generation wRfx: NONREACTIVE

## 2017-11-17 LAB — BASIC METABOLIC PANEL
Anion gap: 7 (ref 5–15)
CO2: 30 mmol/L (ref 22–32)
Calcium: 7.9 mg/dL — ABNORMAL LOW (ref 8.9–10.3)
Chloride: 100 mmol/L — ABNORMAL LOW (ref 101–111)
Glucose, Bld: 105 mg/dL — ABNORMAL HIGH (ref 65–99)
Potassium: 4.1 mmol/L (ref 3.5–5.1)
SODIUM: 137 mmol/L (ref 135–145)

## 2017-11-17 LAB — URINE CULTURE

## 2017-11-17 MED ORDER — MORPHINE 100MG IN NS 100ML (1MG/ML) PREMIX INFUSION
1.0000 mg/h | INTRAVENOUS | Status: DC
  Administered 2017-11-17: 1 mg/h via INTRAVENOUS
  Administered 2017-11-18 (×2): 2 mg/h via INTRAVENOUS
  Filled 2017-11-17 (×3): qty 100

## 2017-11-17 MED ORDER — FENTANYL CITRATE (PF) 100 MCG/2ML IJ SOLN
50.0000 ug | INTRAMUSCULAR | Status: DC | PRN
  Administered 2017-11-17: 100 ug via INTRAVENOUS
  Administered 2017-11-18: 200 ug via INTRAVENOUS
  Filled 2017-11-17 (×4): qty 4
  Filled 2017-11-17: qty 2

## 2017-11-17 MED ORDER — LORAZEPAM 2 MG/ML IJ SOLN: 2.0000 mg | INTRAMUSCULAR | Status: DC | PRN

## 2017-11-17 MED ORDER — ORAL CARE MOUTH RINSE
15.0000 mL | Freq: Two times a day (BID) | OROMUCOSAL | Status: DC
  Administered 2017-11-17 – 2017-11-18 (×2): 15 mL via OROMUCOSAL

## 2017-11-17 MED ORDER — POLYVINYL ALCOHOL 1.4 % OP SOLN
1.0000 [drp] | OPHTHALMIC | Status: DC | PRN
  Filled 2017-11-17: qty 15
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
## 2017-11-17 NOTE — Progress Notes (Signed)
Pts family refusing to allow pts BP to be taken- they also stated they want pt to continue to receive IV abx as well as other medications. Pt states she is in 8/10 pain but refusing pain medication. When RN told pt she is on Morphine gtt-family told Rn that she doesn't take morphine. Pt seems unaware she is on Morphine gtt. Will continue to monitor pt

## 2017-11-17 NOTE — Progress Notes (Signed)
Pts temperature still 100.2 after Advil- Tylenol given x1 per Prn protocol. Will continue to monitor pt

## 2017-11-17 NOTE — Care Management Important Message (Signed)
Important Message  Patient Details  Name: [name] Andersen MRN: [id number] Date of Birth: March 09, 1951   Medicare Important Message Given:  Yes    Renie Ora 11/17/2017, 11:44 AM

## 2017-11-17 NOTE — Procedures (Signed)
**Note De-Identified Ahmeer Tuman Obfuscation** Extubation Procedure Note  Patient Details:   Name: MAANSI WIKE DOB: [birth year] MRN: [id number]   Airway Documentation:    Vent end date: 11/17/17 Vent end time: 1702   Evaluation  O2 sats: stable throughout Complications: No apparent complications Patient did tolerate procedure well. Bilateral Breath Sounds: Rhonchi, Diminished   No  Deaundra Kutzer, Megan Salon 11/17/2017, 5:03 PM

## 2017-11-17 NOTE — Progress Notes (Signed)
Pt has not slept at all this shift. Propofol is maxed out at 47mcg/hr, pt has been given fentanyl x3 this shift per PRN order and has not rested. Pt continuously asking for food, asking to remove the face pads holding the ventilator in place and very anxious.  Pt and husband educated numerous times on plan of care, pain mgmt, ventilator settings, and medications being received. This Rn continuously reassuring pt of plan for extubation today. Pt still has not calmed down-may need something for anxiety if not extubated today. Will pass on to day shift.

## 2017-11-17 NOTE — Progress Notes (Signed)
**Note De-Identified  Obfuscation** Respiratory Care: RT responded to RN in regards to patient desaturation, tachycardia and increased WOB.  Patent would return to appropriate SAT following inline suctioning then quickly desat to low 80s..  RT removed patient from ventilator and bagged with 100% oxygen and lavaged with 20 cc large yellow plugs removed.  Patient returned to ventilator placed back onto full support with 100% FIO2 with SAT in low 80s.  RRT to continue to monitor.

## 2017-11-17 NOTE — Progress Notes (Signed)
Subjective: Seems about the same.  Discussed bronchoscopy but [name] Andersen had a #7 endotracheal tube in and the bronchoscope will not fit through that tube.  [name] Andersen reluctantly agreed to stay on the ventilator overnight.  Her potassium was low last night and has been replaced and is now 4.1.  [name] Andersen is weaning now.  Objective: Vital signs in last 24 hours: Temp:  [98.8 F (37.1 C)-101.3 F (38.5 C)] 99.3 F (37.4 C) (05/29 0740) Pulse Rate:  [76-133] 102 (05/29 0740) Resp:  [14-33] 27 (05/29 0740) BP: (101-161)/(60-142) 137/95 (05/29 0645) SpO2:  [93 %-99 %] 95 % (05/29 0740) FiO2 (%):  [40 %-50 %] 40 % (05/29 0339) Weight:  [38.4 kg (84 lb 10.5 oz)] 38.4 kg (84 lb 10.5 oz) (05/29 0600) Weight change: 6.648 kg (14 lb 10.5 oz) Last BM Date: 11/08/2017  Intake/Output from previous day: 05/28 0701 - 05/29 0700 In: 2765.6 [P.O.:90; I.V.:1475.6; NG/GT:300; IV Piggyback:900] Out: 1800 [Urine:1800]  PHYSICAL EXAM General appearance: alert, cooperative and Anxious Resp: clear to auscultation bilaterally Cardio: regular rate and rhythm, S1, S2 normal, no murmur, click, rub or gallop GI: soft, non-tender; bowel sounds normal; no masses,  no organomegaly Extremities: extremities normal, atraumatic, no cyanosis or edema  Lab Results:  Results for orders placed or performed during the hospital encounter of 11/07/2017 (from the past 48 hour(s))  Comprehensive metabolic panel     Status: Abnormal   Collection Time: 11/14/2017  9:55 AM  Result Value Ref Range   Sodium 137 135 - 145 mmol/L   Potassium 3.6 3.5 - 5.1 mmol/L   Chloride 89 (L) 101 - 111 mmol/L   CO2 39 (H) 22 - 32 mmol/L   Glucose, Bld 139 (H) 65 - 99 mg/dL   BUN 8 6 - 20 mg/dL   Creatinine, Ser <0.30 (L) 0.44 - 1.00 mg/dL   Calcium 8.8 (L) 8.9 - 10.3 mg/dL   Total Protein 6.3 (L) 6.5 - 8.1 g/dL   Albumin 2.5 (L) 3.5 - 5.0 g/dL   AST 17 15 - 41 U/L   ALT 13 (L) 14 - 54 U/L   Alkaline Phosphatase 77 38 - 126 U/L   Total Bilirubin 0.6 0.3 -  1.2 mg/dL   GFR calc non Af Amer NOT CALCULATED >60 mL/min   GFR calc Af Amer NOT CALCULATED >60 mL/min    Comment: (NOTE) The eGFR has been calculated using the CKD EPI equation. This calculation has not been validated in all clinical situations. eGFR's persistently <60 mL/min signify possible Chronic Kidney Disease.    Anion gap 9 5 - 15    Comment: Performed at [facility], [address], [place], [place] [address]  Brain natriuretic peptide     Status: Abnormal   Collection Time: 11/10/2017  9:55 AM  Result Value Ref Range   B Natriuretic Peptide 116.0 (H) 0.0 - 100.0 pg/mL    Comment: Performed at [facility], [address] [place], [place] [address]  Troponin I     Status: None   Collection Time: 11/13/2017  9:55 AM  Result Value Ref Range   Troponin I <0.03 <0.03 ng/mL    Comment: Performed at [facility], [address] [place], [place] [address]  CBC with Differential     Status: Abnormal   Collection Time: 11/14/2017  9:55 AM  Result Value Ref Range   WBC 6.0 4.0 - 10.5 K/uL   RBC 4.18 3.87 - 5.11 MIL/uL   Hemoglobin 11.4 (L) 12.0 - 15.0 g/dL  HCT 36.6 36.0 - 46.0 %   MCV 87.6 78.0 - 100.0 fL   MCH 27.3 26.0 - 34.0 pg   MCHC 31.1 30.0 - 36.0 g/dL   RDW 13.1 11.5 - 15.5 %   Platelets 206 150 - 400 K/uL   Neutrophils Relative % 80 %   Neutro Abs 4.8 1.7 - 7.7 K/uL   Lymphocytes Relative 13 %   Lymphs Abs 0.8 0.7 - 4.0 K/uL   Monocytes Relative 6 %   Monocytes Absolute 0.3 0.1 - 1.0 K/uL   Eosinophils Relative 1 %   Eosinophils Absolute 0.1 0.0 - 0.7 K/uL   Basophils Relative 0 %   Basophils Absolute 0.0 0.0 - 0.1 K/uL    Comment: Performed at [facility], [address], [place], [place] [address]  Protime-INR     Status: Abnormal   Collection Time: 10/23/2017  9:55 AM  Result Value Ref Range   Prothrombin Time 15.4 (H) 11.4 - 15.2 seconds   INR 1.23     Comment: Performed at [facility], [address], [place], [place] [address]  HIV antibody (Routine  Screening)     Status: None   Collection Time: 11/02/2017  9:55 AM  Result Value Ref Range   HIV Screen 4th Generation wRfx Non Reactive Non Reactive    Comment: (NOTE) Performed At: [facility] [place], [place] [id number] Rush Farmer MD [id number] Performed at [facility], [address] [place], [place] [address]   CBG monitoring, ED     Status: Abnormal   Collection Time: 11/01/2017  9:56 AM  Result Value Ref Range   Glucose-Capillary 131 (H) 65 - 99 mg/dL  I-stat troponin, ED     Status: None   Collection Time: 11/04/2017  9:59 AM  Result Value Ref Range   Troponin i, poc 0.00 0.00 - 0.08 ng/mL   Comment 3            Comment: Due to the release kinetics of cTnI, a negative result within the first hours of the onset of symptoms does not rule out myocardial infarction with certainty. If myocardial infarction is still suspected, repeat the test at appropriate intervals.   I-Stat CG4 Lactic Acid, ED     Status: None   Collection Time: 10/28/2017 10:02 AM  Result Value Ref Range   Lactic Acid, Venous 0.80 0.5 - 1.9 mmol/L  Culture, blood (routine x 2)     Status: None (Preliminary result)   Collection Time: 10/28/2017 10:08 AM  Result Value Ref Range   Specimen Description BLOOD RIGHT ANTECUBITAL    Special Requests      BOTTLES DRAWN AEROBIC AND ANAEROBIC Blood Culture adequate volume   Culture      NO GROWTH 2 DAYS Performed at [facility], [address] [place], [place] [address]    Report Status PENDING   Culture, blood (routine x 2)     Status: None (Preliminary result)   Collection Time: 11/01/2017 10:27 AM  Result Value Ref Range   Specimen Description BLOOD LEFT ANTECUBITAL    Special Requests      BOTTLES DRAWN AEROBIC ONLY Blood Culture results may not be optimal due to an inadequate volume of blood received in culture bottles   Culture      NO GROWTH 2 DAYS Performed at [facility], [address], [place], [place] [address]    Report  Status PENDING   Lactic acid, plasma     Status: None  Collection Time: 10/29/2017 10:27 AM  Result Value Ref Range   Lactic Acid, Venous 0.7 0.5 - 1.9 mmol/L    Comment: Performed at [facility], [address], [place], [place] [address]  Urinalysis, Routine w reflex microscopic     Status: Abnormal   Collection Time: 11/18/2017 11:21 AM  Result Value Ref Range   Color, Urine YELLOW YELLOW   APPearance HAZY (A) CLEAR   Specific Gravity, Urine 1.013 1.005 - 1.030   pH 5.0 5.0 - 8.0   Glucose, UA NEGATIVE NEGATIVE mg/dL   Hgb urine dipstick NEGATIVE NEGATIVE   Bilirubin Urine NEGATIVE NEGATIVE   Ketones, ur 20 (A) NEGATIVE mg/dL   Protein, ur NEGATIVE NEGATIVE mg/dL   Nitrite NEGATIVE NEGATIVE   Leukocytes, UA SMALL (A) NEGATIVE   RBC / HPF 6-10 0 - 5 RBC/hpf   WBC, UA 11-20 0 - 5 WBC/hpf   Bacteria, UA FEW (A) NONE SEEN   Trans Epithel, UA <1    WBC Clumps PRESENT    Mucus PRESENT    Budding Yeast PRESENT    Hyaline Casts, UA PRESENT     Comment: Performed at [facility], [address], [place], [place] [address]  Lactic acid, plasma     Status: None   Collection Time: 10/27/2017 11:53 AM  Result Value Ref Range   Lactic Acid, Venous 0.6 0.5 - 1.9 mmol/L    Comment: Performed at [facility], [address] [place], [place] [address]  Draw ABG 1 hour after initiation of ventilator     Status: Abnormal   Collection Time: 11/09/2017 11:57 AM  Result Value Ref Range   FIO2 80.00    Delivery systems VENTILATOR    Mode PRESSURE REGULATED VOLUME CONTROL    VT 340 mL   LHR 14 resp/min   Peep/cpap 5.0 cm H20   pH, Arterial 7.447 7.350 - 7.450   pCO2 arterial 49.8 (H) 32.0 - 48.0 mmHg   pO2, Arterial 176 (H) 83.0 - 108.0 mmHg   Bicarbonate 32.7 (H) 20.0 - 28.0 mmol/L   Acid-Base Excess 9.5 (H) 0.0 - 2.0 mmol/L   O2 Saturation 99.2 %   Patient temperature 34.6    Collection site RIGHT BRACHIAL    Drawn by [phone]    Sample type ARTERIAL DRAW    Allens test (pass/fail) PASS PASS     Comment: Performed at [facility], [address], [place], [place] [address]  Triglycerides     Status: None   Collection Time: 11/10/2017  2:56 PM  Result Value Ref Range   Triglycerides 62 <150 mg/dL    Comment: Performed at [facility], [address] [place], [place] [address]  Strep pneumoniae urinary antigen     Status: None   Collection Time: 11/10/2017  4:50 PM  Result Value Ref Range   Strep Pneumo Urinary Antigen NEGATIVE NEGATIVE    Comment:        Infection due to S. pneumoniae cannot be absolutely ruled out since the antigen present may be below the detection limit of the test. Performed at [facility], [address] [place], [place] [address]   MRSA PCR Screening     Status: None   Collection Time: 10/25/2017  5:04 PM  Result Value Ref Range   MRSA by PCR NEGATIVE NEGATIVE    Comment:        The GeneXpert MRSA Assay (FDA approved for NASAL specimens only), is one component of a comprehensive MRSA colonization surveillance  program. It is not intended to diagnose MRSA infection nor to guide or monitor treatment for MRSA infections. Performed at [facility], [address] [place], [place] [address]   CBC WITH DIFFERENTIAL     Status: Abnormal   Collection Time: 11/04/2017  6:49 AM  Result Value Ref Range   WBC 7.6 4.0 - 10.5 K/uL   RBC 3.72 (L) 3.87 - 5.11 MIL/uL   Hemoglobin 10.0 (L) 12.0 - 15.0 g/dL   HCT 31.6 (L) 36.0 - 46.0 %   MCV 84.9 78.0 - 100.0 fL   MCH 26.9 26.0 - 34.0 pg   MCHC 31.6 30.0 - 36.0 g/dL   RDW 13.3 11.5 - 15.5 %   Platelets 218 150 - 400 K/uL   Neutrophils Relative % 88 %   Neutro Abs 6.7 1.7 - 7.7 K/uL   Lymphocytes Relative 6 %   Lymphs Abs 0.5 (L) 0.7 - 4.0 K/uL   Monocytes Relative 6 %   Monocytes Absolute 0.5 0.1 - 1.0 K/uL   Eosinophils Relative 0 %   Eosinophils Absolute 0.0 0.0 - 0.7 K/uL   Basophils Relative 0 %   Basophils Absolute 0.0 0.0 - 0.1 K/uL    Comment: Performed at [facility], [address]  [place], [place] [address]  Comprehensive metabolic panel     Status: Abnormal   Collection Time: 11/11/2017  6:49 AM  Result Value Ref Range   Sodium 136 135 - 145 mmol/L   Potassium 2.9 (L) 3.5 - 5.1 mmol/L   Chloride 94 (L) 101 - 111 mmol/L   CO2 30 22 - 32 mmol/L   Glucose, Bld 81 65 - 99 mg/dL   BUN 7 6 - 20 mg/dL   Creatinine, Ser <0.30 (L) 0.44 - 1.00 mg/dL   Calcium 8.0 (L) 8.9 - 10.3 mg/dL   Total Protein 5.7 (L) 6.5 - 8.1 g/dL   Albumin 2.3 (L) 3.5 - 5.0 g/dL   AST 12 (L) 15 - 41 U/L   ALT 12 (L) 14 - 54 U/L   Alkaline Phosphatase 68 38 - 126 U/L   Total Bilirubin 1.3 (H) 0.3 - 1.2 mg/dL   GFR calc non Af Amer NOT CALCULATED >60 mL/min   GFR calc Af Amer NOT CALCULATED >60 mL/min    Comment: (NOTE) The eGFR has been calculated using the CKD EPI equation. This calculation has not been validated in all clinical situations. eGFR's persistently <60 mL/min signify possible Chronic Kidney Disease.    Anion gap 12 5 - 15    Comment: Performed at [facility], [address] [place], [place] [address]  Magnesium     Status: Abnormal   Collection Time: 11/07/2017  6:49 AM  Result Value Ref Range   Magnesium 1.4 (L) 1.7 - 2.4 mg/dL    Comment: Performed at [facility], [address] [place], [place] [address]  Basic metabolic panel     Status: Abnormal   Collection Time: 11/18/2017  8:10 PM  Result Value Ref Range   Sodium 139 135 - 145 mmol/L   Potassium 2.4 (LL) 3.5 - 5.1 mmol/L    Comment: CRITICAL RESULT CALLED TO, READ BACK BY AND VERIFIED WITH: AMBURN,A ON 10/29/2017 AT 2115 BY LOY,C    Chloride 97 (L) 101 - 111 mmol/L   CO2 28 22 - 32 mmol/L   Glucose, Bld 86 65 - 99 mg/dL   BUN <5 (L) 6 - 20 mg/dL   Creatinine, Ser <0.30 (L) 0.44 - 1.00  mg/dL   Calcium 8.0 (L) 8.9 - 10.3 mg/dL   GFR calc non Af Amer NOT CALCULATED >60 mL/min   GFR calc Af Amer NOT CALCULATED >60 mL/min    Comment: (NOTE) The eGFR has been calculated using the CKD EPI equation. This calculation has not  been validated in all clinical situations. eGFR's persistently <60 mL/min signify possible Chronic Kidney Disease.    Anion gap 14 5 - 15    Comment: Performed at [facility], [address], [place], [place] [address]  Basic metabolic panel     Status: Abnormal   Collection Time: 11/17/17  4:52 AM  Result Value Ref Range   Sodium 137 135 - 145 mmol/L   Potassium 4.1 3.5 - 5.1 mmol/L    Comment: DELTA CHECK NOTED   Chloride 100 (L) 101 - 111 mmol/L   CO2 30 22 - 32 mmol/L   Glucose, Bld 105 (H) 65 - 99 mg/dL   BUN <5 (L) 6 - 20 mg/dL   Creatinine, Ser <0.30 (L) 0.44 - 1.00 mg/dL   Calcium 7.9 (L) 8.9 - 10.3 mg/dL   GFR calc non Af Amer NOT CALCULATED >60 mL/min   GFR calc Af Amer NOT CALCULATED >60 mL/min    Comment: (NOTE) The eGFR has been calculated using the CKD EPI equation. This calculation has not been validated in all clinical situations. eGFR's persistently <60 mL/min signify possible Chronic Kidney Disease.    Anion gap 7 5 - 15    Comment: Performed at [facility], [address], [place], [place] [address]    ABGS Recent Labs    11/05/2017 1157  PHART 7.447  PO2ART 176*  HCO3 32.7*   CULTURES Recent Results (from the past 240 hour(s))  Culture, blood (routine x 2)     Status: None (Preliminary result)   Collection Time: 10/23/2017 10:08 AM  Result Value Ref Range Status   Specimen Description BLOOD RIGHT ANTECUBITAL  Final   Special Requests   Final    BOTTLES DRAWN AEROBIC AND ANAEROBIC Blood Culture adequate volume   Culture   Final    NO GROWTH 2 DAYS Performed at [facility], [address] [place], [place] [address]    Report Status PENDING  Incomplete  Culture, blood (routine x 2)     Status: None (Preliminary result)   Collection Time: 11/05/2017 10:27 AM  Result Value Ref Range Status   Specimen Description BLOOD LEFT ANTECUBITAL  Final   Special Requests   Final    BOTTLES DRAWN AEROBIC ONLY Blood Culture results may not be optimal due to an  inadequate volume of blood received in culture bottles   Culture   Final    NO GROWTH 2 DAYS Performed at [facility], [address], [place], [place] [address]    Report Status PENDING  Incomplete  MRSA PCR Screening     Status: None   Collection Time: 11/18/2017  5:04 PM  Result Value Ref Range Status   MRSA by PCR NEGATIVE NEGATIVE Final    Comment:        The GeneXpert MRSA Assay (FDA approved for NASAL specimens only), is one component of a comprehensive MRSA colonization surveillance program. It is not intended to diagnose MRSA infection nor to guide or monitor treatment for MRSA infections. Performed at [facility], [address], [place], [place] [address]    Studies/Results: Dg Chest 1v Repeat Same Day  Result Date: 11/06/2017 CLINICAL DATA:  Pt intubated and quadriplegic.  Same day follow up cxr for aspiration. EXAM: CHEST - 1 VIEW SAME DAY COMPARISON:  Earlier film of the same day FINDINGS: Interval opacification of the left hemithorax with volume loss. Right lung mildly hyperinflated. Endotracheal tube and nasogastric tube remain in good position. Cholecystectomy clips. Heart size difficult to assess due to adjacent opacities. There is no right-sided effusion or pneumothorax. Visualized bones unremarkable. IMPRESSION: 1. Opacification of left hemithorax and volume loss, suggesting central airway occlusion possibly secondary to mucous plugging. Can't exclude concordant left pleural effusion. 2.  Support hardware stable in position. Electronically Signed   By: Lucrezia Europe M.D.   On: 11/02/2017 12:35   Dg Chest Port 1 View  Result Date: 11/12/2017 CLINICAL DATA:  Respiratory failure EXAM: PORTABLE CHEST 1 VIEW COMPARISON:  10/30/2017 FINDINGS: Endotracheal tube and NG tube remain in place, unchanged. Decreasing left effusion. Continued moderate left effusion and diffuse left lung airspace disease. Scarring in the right upper lobe/apex. Heart is mildly enlarged. IMPRESSION:  Improving aeration on the left with decreasing left effusion, now moderate. Continued diffuse left lung airspace disease, atelectasis versus infiltrate. Electronically Signed   By: Rolm Baptise M.D.   On: 11/14/2017 09:54   Dg Chest Port 1 View  Result Date: 10/26/2017 CLINICAL DATA:  Hypoxia EXAM: PORTABLE CHEST 1 VIEW COMPARISON:  August 05, 2014 FINDINGS: Endotracheal tube tip is 5.0 cm above the carina. Nasogastric tube tip and side port are in the stomach. No pneumothorax. There is consolidation throughout much of the left lung with volume loss. Right lung is somewhat hyperexpanded clear. Heart size and pulmonary vascularity are normal. No adenopathy. No bone lesions. IMPRESSION: Tube positions as described without pneumothorax. Consolidation throughout much of the left lung with volume loss. Suspect multifocal pneumonia. There may be a degree of mucous plugging as well. Right lung hyperexpanded but clear. Heart size within normal limits and stable. Electronically Signed   By: Lowella Grip III M.D.   On: 11/14/2017 10:38    Medications:  Prior to Admission:  Medications Prior to Admission  Medication Sig Dispense Refill Last Dose  . albuterol (PROVENTIL) (2.5 MG/3ML) 0.083% nebulizer solution Take 2.5 mg by nebulization 4 (four) times daily as needed for wheezing or shortness of breath.   11/14/2017 at Unknown time  . ALPRAZolam (XANAX XR) 0.5 MG 24 hr tablet Take 1 tablet (0.5 mg total) by mouth at bedtime as needed for sleep. 30 tablet 0 Past Week at Unknown time  . baclofen (LIORESAL) 20 MG tablet Take 40 mg by mouth 3 (three) times daily.    11/03/2017 at Unknown time  . BELSOMRA 20 MG TABS Take 0.5 tablets by mouth daily.    11/14/2017 at Unknown time  . diazepam (VALIUM) 10 MG tablet Take 10 mg by mouth daily as needed for anxiety.    unknown  . gabapentin (NEURONTIN) 600 MG tablet Take 600 mg by mouth 3 (three) times daily.   10/24/2017 at Unknown time  . Multiple Vitamin  (MULTIVITAMIN WITH MINERALS) TABS tablet Take 1 tablet by mouth daily.   11/08/2017 at Unknown time  . potassium chloride (K-DUR,KLOR-CON) 10 MEQ tablet Take 10 mEq by mouth daily.   11/12/2017 at Unknown time  . saccharomyces boulardii (FLORASTOR) 250 MG capsule Take 1 capsule (250 mg total) by mouth 2 (two) times daily. 60 capsule 1 11/14/2017 at Unknown time  . Suvorexant (BELSOMRA) 20 MG TABS Take 0.5 tablets by mouth at bedtime. 30 tablet 0 11/14/2017 at Unknown time  . temazepam (  RESTORIL) 30 MG capsule Take 30 mg by mouth at bedtime as needed for sleep.    11/14/2017 at Unknown time   Scheduled: . acetylcysteine  3 mL Nebulization Q4H  . chlorhexidine gluconate (MEDLINE KIT)  15 mL Mouth Rinse BID  . enoxaparin (LOVENOX) injection  30 mg Subcutaneous Daily  . ipratropium-albuterol  3 mL Nebulization Q4H  . mouth rinse  15 mL Mouth Rinse 10 times per day  . potassium chloride  40 mEq Per Tube Daily   Continuous: . sodium chloride 60 mL/hr at 10/25/2017 2040  . famotidine (PEPCID) IV Stopped (11/03/2017 2235)  . piperacillin-tazobactam (ZOSYN)  IV 3.375 g (11/17/17 0453)  . propofol (DIPRIVAN) infusion 50 mcg/kg/min (11/17/17 0246)   KLT:YVDPBAQVOHCSP (TYLENOL) oral liquid 160 mg/5 mL, bisacodyl, fentaNYL (SUBLIMAZE) injection, fentaNYL (SUBLIMAZE) injection  Assesment: [name] Andersen was admitted with pneumonia presumably aspiration.  [name] Andersen has been intubated and placed on the ventilator on admission although this was not what [name] Andersen wanted.  [name] Andersen has reluctantly agreed to remain on the ventilator for now but wants to come off today.  I have ordered another chest x-ray that has not been done yet.  I like to see the x-ray so we know what were dealing with before we extubate her and we can give her a good suction if needed.  At baseline [name] Andersen has quadriplegia and chronic respiratory failure because of poor respiratory muscle function  [name] Andersen has severe malnutrition which is stable  [name] Andersen has had hypokalemia which has  been replaced Principal Problem:   Acute respiratory failure with hypoxia (HCC) Active Problems:   Quadriplegia (HCC)   Spinal cord injury, C5-C7 ([place])   Protein-calorie malnutrition, severe (HCC)   Aspiration pneumonia (HCC)   Hypoxemia   SIRS (systemic inflammatory response syndrome) ([place])   DNR (do not resuscitate)   Pressure injury of skin    Plan: Chest x-ray and then extubate    LOS: 2 days   Korde Jeppsen L 11/17/2017, 7:48 AM

## 2017-11-17 NOTE — Progress Notes (Signed)
Present with [name] Andersen and her family for emotional and spiritual support. She was on the ventilator and flanked on each side by her daughters, [name] Andersen and [name] Andersen, both quiet and tearful. [name] Andersen, her oldest sister shared much of the dialogue about the struggles, deep love and faith, strong family bonds in tears and with laughter also.[name] Andersen also shared about his care of his wife and many family members called him a "saint' in his many years of constant and dedicated care of his wife.  [name] Andersen is her pastor and is also serving as the Chaplain this evening.He will be returning to be with them. The plan is to remove her from the ventilator [place] They are very appropriate in their grief over her struggles and sufferings of past years and into this day.

## 2017-11-17 NOTE — Progress Notes (Signed)
Nutrition Follow-up   Reason: Low Braden and Mechanical Intubation  Intervention:   None recommended -pt planned for terminal wean  Assessment:   Unable to complete bronchoscopy per pulmonology. Patient does not want to remain on vent.  Family at bedside waiting for terminal wean of pt. Talked with her nurse.   Skin: stage 3 sacrum,  stage 2 right heel, left ankle -scabbed area  Diet Order:  NPO  Meds: Scheduled Meds: . acetylcysteine  3 mL Nebulization Q4H  . chlorhexidine gluconate (MEDLINE KIT)  15 mL Mouth Rinse BID  . enoxaparin (LOVENOX) injection  30 mg Subcutaneous Daily  . ipratropium-albuterol  3 mL Nebulization Q4H  . mouth rinse  15 mL Mouth Rinse 10 times per day  . potassium chloride  40 mEq Per Tube Daily   Continuous Infusions: . sodium chloride 60 mL/hr at 11/17/17 1157  . famotidine (PEPCID) IV Stopped (11/17/17 1010)  . piperacillin-tazobactam (ZOSYN)  IV 3.375 g (11/17/17 1157)  . propofol (DIPRIVAN) infusion 50 mcg/kg/min (11/17/17 1158)   PRN Meds:.acetaminophen (TYLENOL) oral liquid 160 mg/5 mL, bisacodyl, fentaNYL (SUBLIMAZE) injection, fentaNYL (SUBLIMAZE) injection, fentaNYL, LORazepam   CMP     Component Value Date/Time   NA 137 11/17/2017 0452   K 4.1 11/17/2017 0452   CL 100 (L) 11/17/2017 0452   CO2 30 11/17/2017 0452   GLUCOSE 105 (H) 11/17/2017 0452   BUN <5 (L) 11/17/2017 0452   CREATININE <0.30 (L) 11/17/2017 0452   CALCIUM 7.9 (L) 11/17/2017 0452   PROT 5.7 (L) 10/21/2017 0649   ALBUMIN 2.3 (L) 11/08/2017 0649   AST 12 (L) 11/11/2017 0649   ALT 12 (L) 11/13/2017 0649   ALKPHOS 68 10/26/2017 0649   BILITOT 1.3 (H) 11/06/2017 0649   GFRNONAA NOT CALCULATED 11/17/2017 0452   GFRAA NOT CALCULATED 11/17/2017 0452    CBG (last 3)  Recent Labs    11/09/2017 0956  GLUCAP 131*     Intake/Output Summary (Last 24 hours) at 11/17/2017 1355 Last data filed at 11/17/2017 0940 Gross per 24 hour  Intake 2753.95 ml  Output 1800 ml  Net  953.95 ml    Estimated needs:  Not applicable given pt expected imminent death    Goal:  Provide comfort support for family members as needed.    Colman Cater MS,RD,CSG,LDN Office: [phone] Pager: [phone]

## 2017-11-18 MED ORDER — ATROPINE SULFATE 1 % OP SOLN
2.0000 [drp] | Freq: Three times a day (TID) | OPHTHALMIC | Status: DC | PRN
  Administered 2017-11-18: 2 [drp] via SUBLINGUAL
  Filled 2017-11-18: qty 2

## 2017-11-18 MED ORDER — GABAPENTIN 250 MG/5ML PO SOLN
300.0000 mg | Freq: Three times a day (TID) | ORAL | Status: DC
  Administered 2017-11-18: 300 mg via ORAL
  Filled 2017-11-18 (×4): qty 6

## 2017-11-18 MED ORDER — DIPHENHYDRAMINE HCL 50 MG/ML IJ SOLN
12.5000 mg | Freq: Four times a day (QID) | INTRAMUSCULAR | Status: DC | PRN
  Administered 2017-11-18 (×3): 12.5 mg via INTRAVENOUS
  Filled 2017-11-18 (×3): qty 1

## 2017-11-18 MED ORDER — SODIUM CHLORIDE 0.9 % IV SOLN: INTRAVENOUS | Status: DC

## 2017-11-18 NOTE — Progress Notes (Signed)
Pts husband asked this RN if he could look at pts CXR on the computer. This RN explained to pt that the MD has to show him the CXR and explain it to the pt and family in case they have any questions. Will continue to monitor pt

## 2017-11-18 NOTE — Progress Notes (Signed)
Palliative: [name] Andersen is resting comfortably.  No overt signs and symptoms of discomfort.  She is surrounded by her family.  Increased oral secretions/gurgling noted, discussed the use of atropine sublingual drops with family.  Encouraged to ask for as needed.  Psychosocial support provided to family. Conference with nursing staff related to symptom management, goals of care, anticipated hospital death.  Support provided to nursing staff. 25 minutes [name] Carmel, NP Palliative Medicine Team Team Phone # [phone]

## 2017-11-18 NOTE — Progress Notes (Signed)
Pharmacy Antibiotic Note  [name] Andersen is a 67 y.o. female admitted on December 07, 2017 with aspiration pneumonia. This is day #4 of Zosyn therapy.  Plan:  Continue Zosyn 3.375g IV q8h (infuse over 4 hrs)  Height:  (165.1 cm) Weight: 84 lb 10.5 oz (38.4 kg) IBW/kg (Calculated) : 57  Temp (24hrs), Avg:99.6 F (37.6 C), Min:97.3 F (36.3 C), Max:100.2 F (37.9 C)  Recent Labs  Lab 12-07-17 0955 2017/12/07 1002 December 07, 2017 1027 12/07/17 1153 10/30/2017 0649 10/29/2017 2010 11/17/17 0452  WBC 6.0  --   --   --  7.6  --   --   CREATININE <0.30*  --   --   --  <0.30* <0.30* <0.30*  LATICACIDVEN  --  0.80 0.7 0.6  --   --   --     CrClest~ 34   mL/min (rounded Scr to 1.0 for age/quadriplegia)      No Known Allergies  Antimicrobials this admission: Zosyn 5/27 >>  Vanc 5/27 x 1 Rocephin 5/27 x 1 Azith 5/27 x 1  Dose adjustments this admission: n/a   Microbiology results: 5/27 BC x2: NG x3 days  5/27 UCx:  20K CFU yeast  5/27 MRSA PCR: negative  Thank you for allowing pharmacy to be a part of this patient's care.  Tama High 11/18/2017 12:49 PM

## 2017-11-18 NOTE — Progress Notes (Signed)
Subjective: Her family decided yesterday to remove her from endotracheal tube and ventilator.  This was in accordance with her wishes.  Initially she was poorly responsive but has gotten better.  She is talking a little bit this morning.  She has oxygen saturation in the low 90s mostly.  She is complaining of being thirsty.  She got choked on water.  Family wants to be sure that she can get some gabapentin because she had terrible withdrawal when she tried to come off of that before.  They do not want "aggressive care".  Her husband wants to make sure she is comfortable.  Objective: Vital signs in last 24 hours: Temp:  [97.3 F (36.3 C)-100.6 F (38.1 C)] 97.3 F (36.3 C) (05/30 0400) Pulse Rate:  [93-158] 93 (05/30 0400) Resp:  [11-35] 26 (05/30 0000) BP: (74-133)/(12-96) 75/62 (05/29 1630) SpO2:  [82 %-97 %] 82 % (05/30 0400) FiO2 (%):  [100 %] 100 % (05/29 1210) Weight change:  Last BM Date: 11/17/17  Intake/Output from previous day: 05/29 0701 - 05/30 0700 In: 1029.1 [I.V.:854.1; IV Piggyback:175] Out: 1550 [Urine:1550]  PHYSICAL EXAM General appearance: alert and Very weak and frail her voice can barely be heard Resp: Diminished breath sounds bilaterally Cardio: regular rate and rhythm, S1, S2 normal, no murmur, click, rub or gallop GI: soft, non-tender; bowel sounds normal; no masses,  no organomegaly Extremities: extremities normal, atraumatic, no cyanosis or edema  Lab Results:  Results for orders placed or performed during the hospital encounter of 11/06/2017 (from the past 48 hour(s))  Basic metabolic panel     Status: Abnormal   Collection Time: 11/05/2017  8:10 PM  Result Value Ref Range   Sodium 139 135 - 145 mmol/L   Potassium 2.4 (LL) 3.5 - 5.1 mmol/L    Comment: CRITICAL RESULT CALLED TO, READ BACK BY AND VERIFIED WITH: AMBURN,A ON 10/23/2017 AT 2115 BY LOY,C    Chloride 97 (L) 101 - 111 mmol/L   CO2 28 22 - 32 mmol/L   Glucose, Bld 86 65 - 99 mg/dL   BUN <5 (L) 6  - 20 mg/dL   Creatinine, Ser <0.30 (L) 0.44 - 1.00 mg/dL   Calcium 8.0 (L) 8.9 - 10.3 mg/dL   GFR calc non Af Amer NOT CALCULATED >60 mL/min   GFR calc Af Amer NOT CALCULATED >60 mL/min    Comment: (NOTE) The eGFR has been calculated using the CKD EPI equation. This calculation has not been validated in all clinical situations. eGFR's persistently <60 mL/min signify possible Chronic Kidney Disease.    Anion gap 14 5 - 15    Comment: Performed at [facility], [address] [place], [place] [address]  Basic metabolic panel     Status: Abnormal   Collection Time: 11/17/17  4:52 AM  Result Value Ref Range   Sodium 137 135 - 145 mmol/L   Potassium 4.1 3.5 - 5.1 mmol/L    Comment: DELTA CHECK NOTED   Chloride 100 (L) 101 - 111 mmol/L   CO2 30 22 - 32 mmol/L   Glucose, Bld 105 (H) 65 - 99 mg/dL   BUN <5 (L) 6 - 20 mg/dL   Creatinine, Ser <0.30 (L) 0.44 - 1.00 mg/dL   Calcium 7.9 (L) 8.9 - 10.3 mg/dL   GFR calc non Af Amer NOT CALCULATED >60 mL/min   GFR calc Af Amer NOT CALCULATED >60 mL/min    Comment: (NOTE) The eGFR has been calculated using the CKD EPI equation. This  calculation has not been validated in all clinical situations. eGFR's persistently <60 mL/min signify possible Chronic Kidney Disease.    Anion gap 7 5 - 15    Comment: Performed at [facility], [address], [place], [place] [address]    ABGS Recent Labs    11/06/2017 1157  PHART 7.447  PO2ART 176*  HCO3 32.7*   CULTURES Recent Results (from the past 240 hour(s))  Culture, blood (routine x 2)     Status: None (Preliminary result)   Collection Time: 11/06/2017 10:08 AM  Result Value Ref Range Status   Specimen Description BLOOD RIGHT ANTECUBITAL  Final   Special Requests   Final    BOTTLES DRAWN AEROBIC AND ANAEROBIC Blood Culture adequate volume   Culture   Final    NO GROWTH 3 DAYS Performed at [facility], [address], [place], [place] [address]    Report Status PENDING  Incomplete  Culture,  blood (routine x 2)     Status: None (Preliminary result)   Collection Time: 11/12/2017 10:27 AM  Result Value Ref Range Status   Specimen Description BLOOD LEFT ANTECUBITAL  Final   Special Requests   Final    BOTTLES DRAWN AEROBIC ONLY Blood Culture results may not be optimal due to an inadequate volume of blood received in culture bottles   Culture   Final    NO GROWTH 3 DAYS Performed at [facility], [address] [place], [place] [address]    Report Status PENDING  Incomplete  Urine culture     Status: Abnormal   Collection Time: 10/22/2017 11:21 AM  Result Value Ref Range Status   Specimen Description   Final    URINE, CLEAN CATCH Performed at [facility], [address], [place], [place] [address]    Special Requests   Final    NONE Performed at [facility], [address], [place], [place] [address]    Culture 20,000 COLONIES/mL YEAST (A)  Final   Report Status 11/17/2017 FINAL  Final  MRSA PCR Screening     Status: None   Collection Time: 11/10/2017  5:04 PM  Result Value Ref Range Status   MRSA by PCR NEGATIVE NEGATIVE Final    Comment:        The GeneXpert MRSA Assay (FDA approved for NASAL specimens only), is one component of a comprehensive MRSA colonization surveillance program. It is not intended to diagnose MRSA infection nor to guide or monitor treatment for MRSA infections. Performed at [facility], [address], [place], [place] [address]    Studies/Results: Dg Chest Port 1 View  Result Date: 11/17/2017 CLINICAL DATA:  67 year old female with respiratory failure. Suspected aspiration pneumonia. EXAM: PORTABLE CHEST 1 VIEW COMPARISON:  11/06/2017 and earlier. FINDINGS: Portable AP semi upright view at 0859 hours. Endotracheal tube tip in good position between the level the clavicles and carina. Enteric tube courses to the left abdomen, side hole is at the level of the gastric cardia. Continued subtotal opacification of the left hemithorax, mildly worsened  since yesterday. Some air bronchograms are visible about the left hilum. Again evidence of volume loss in the left lung with leftward shift of the mediastinum. The right lung appears stable and clear. Paucity of bowel gas. Stable cholecystectomy clips. No acute osseous abnormality identified. IMPRESSION: 1.  Stable lines and tubes. 2. Mixed volume loss and consolidation suspected throughout the left lung with mild progression since yesterday. Electronically Signed   By: Herminio Heads.D.  On: 11/17/2017 09:21   Dg Chest Port 1 View  Result Date: 11/17/2017 CLINICAL DATA:  Respiratory failure EXAM: PORTABLE CHEST 1 VIEW COMPARISON:  11/05/2017 FINDINGS: Endotracheal tube and NG tube remain in place, unchanged. Decreasing left effusion. Continued moderate left effusion and diffuse left lung airspace disease. Scarring in the right upper lobe/apex. Heart is mildly enlarged. IMPRESSION: Improving aeration on the left with decreasing left effusion, now moderate. Continued diffuse left lung airspace disease, atelectasis versus infiltrate. Electronically Signed   By: Rolm Baptise M.D.   On: 11/18/2017 09:54    Medications:  Prior to Admission:  Medications Prior to Admission  Medication Sig Dispense Refill Last Dose  . albuterol (PROVENTIL) (2.5 MG/3ML) 0.083% nebulizer solution Take 2.5 mg by nebulization 4 (four) times daily as needed for wheezing or shortness of breath.   11/14/2017 at Unknown time  . ALPRAZolam (XANAX XR) 0.5 MG 24 hr tablet Take 1 tablet (0.5 mg total) by mouth at bedtime as needed for sleep. 30 tablet 0 Past Week at Unknown time  . baclofen (LIORESAL) 20 MG tablet Take 40 mg by mouth 3 (three) times daily.    10/28/2017 at Unknown time  . BELSOMRA 20 MG TABS Take 0.5 tablets by mouth daily.    11/14/2017 at Unknown time  . diazepam (VALIUM) 10 MG tablet Take 10 mg by mouth daily as needed for anxiety.    unknown  . gabapentin (NEURONTIN) 600 MG tablet Take 600 mg by mouth 3 (three) times  daily.   10/29/2017 at Unknown time  . Multiple Vitamin (MULTIVITAMIN WITH MINERALS) TABS tablet Take 1 tablet by mouth daily.   10/29/2017 at Unknown time  . potassium chloride (K-DUR,KLOR-CON) 10 MEQ tablet Take 10 mEq by mouth daily.   11/07/2017 at Unknown time  . saccharomyces boulardii (FLORASTOR) 250 MG capsule Take 1 capsule (250 mg total) by mouth 2 (two) times daily. 60 capsule 1 11/14/2017 at Unknown time  . Suvorexant (BELSOMRA) 20 MG TABS Take 0.5 tablets by mouth at bedtime. 30 tablet 0 11/14/2017 at Unknown time  . temazepam (RESTORIL) 30 MG capsule Take 30 mg by mouth at bedtime as needed for sleep.    11/14/2017 at Unknown time   Scheduled: . gabapentin  300 mg Oral Q8H  . mouth rinse  15 mL Mouth Rinse BID   Continuous: . sodium chloride 10 mL/hr at 11/18/17 0634  . sodium chloride    . famotidine (PEPCID) IV Stopped (11/17/17 2229)  . morphine 2 mg/hr (11/17/17 1957)  . piperacillin-tazobactam (ZOSYN)  IV Stopped (11/18/17 6283)  . propofol (DIPRIVAN) infusion Stopped (11/17/17 1700)   TDV:VOHYWVPXTGGYI (TYLENOL) oral liquid 160 mg/5 mL, bisacodyl, diphenhydrAMINE, fentaNYL (SUBLIMAZE) injection, fentaNYL (SUBLIMAZE) injection, fentaNYL, LORazepam, polyvinyl alcohol  Assesment: She was admitted with acute hypoxic respiratory failure.  She has quadriplegia from a spinal cord injury.  She has severe protein calorie malnutrition.  She has aspiration pneumonia and it looks like she is got some mucus plugging.  She is off the ventilator and focuses more toward comfort. Principal Problem:   Acute respiratory failure with hypoxia (HCC) Active Problems:   Quadriplegia (HCC)   Spinal cord injury, C5-C7 ([place])   Protein-calorie malnutrition, severe (HCC)   Aspiration pneumonia (HCC)   Hypoxemia   SIRS (systemic inflammatory response syndrome) (HCC)   DNR (do not resuscitate)   Pressure injury of skin   Palliative care by specialist   Goals of care, counseling/discussion   End of  life care  Plan: Continue treatments.  I did add some IV fluids because of the family is concerned about her thirst.  She will have liquid oral gabapentin.    LOS: 3 days   Keyaria Lawson L 11/18/2017, 8:28 AM

## 2017-11-19 DIAGNOSIS — L89153 Pressure ulcer of sacral region, stage 3: Secondary | ICD-10-CM | POA: Diagnosis present

## 2017-11-20 LAB — CULTURE, BLOOD (ROUTINE X 2)
CULTURE: NO GROWTH
Culture: NO GROWTH
SPECIAL REQUESTS: ADEQUATE

## 2017-11-20 NOTE — Care Management Important Message (Signed)
Important Message  Patient Details  Name: Noe GensJane M Minish MRN: [id number] Date of Birth: 10/02/1950   Medicare Important Message Given:  Yes    Renie OraHawkins, Abir Craine Smith 11/07/2017, 12:43 PM

## 2017-11-20 NOTE — Progress Notes (Signed)
Subjective: She is clearly worse.  She is breathing around 8 times per minute oxygenation is worse.  She does look comfortable.  Objective: Vital signs in last 24 hours: Temp:  [96.4 F (35.8 C)-98.8 F (37.1 C)] 97 F (36.1 C) (05/31 0600) Pulse Rate:  [88-106] 90 (05/31 0600) Resp:  [13-32] 18 (05/31 0600) SpO2:  [79 %-95 %] 79 % (05/31 0600) Weight change:  Last BM Date: 11/17/17  Intake/Output from previous day: 05/30 0701 - 05/31 0700 In: 377.2 [I.V.:277.2; IV Piggyback:100] Out: 750 [Urine:750]  PHYSICAL EXAM General appearance: Unresponsive Resp: rhonchi bilaterally Cardio: regular rate and rhythm, S1, S2 normal, no murmur, click, rub or gallop GI: Not examined Extremities: Not examined  Lab Results:  No results found for this or any previous visit (from the past 48 hour(s)).  ABGS No results for input(s): PHART, PO2ART, TCO2, HCO3 in the last 72 hours.  Invalid input(s): PCO2 CULTURES Recent Results (from the past 240 hour(s))  Culture, blood (routine x 2)     Status: None (Preliminary result)   Collection Time: 12/04/17 10:08 AM  Result Value Ref Range Status   Specimen Description BLOOD RIGHT ANTECUBITAL  Final   Special Requests   Final    BOTTLES DRAWN AEROBIC AND ANAEROBIC Blood Culture adequate volume   Culture   Final    NO GROWTH 4 DAYS Performed at [facility], [address], [place], [place] [address]    Report Status PENDING  Incomplete  Culture, blood (routine x 2)     Status: None (Preliminary result)   Collection Time: 2017-12-04 10:27 AM  Result Value Ref Range Status   Specimen Description BLOOD LEFT ANTECUBITAL  Final   Special Requests   Final    BOTTLES DRAWN AEROBIC ONLY Blood Culture results may not be optimal due to an inadequate volume of blood received in culture bottles   Culture   Final    NO GROWTH 4 DAYS Performed at [facility], [address], [place], [place] [address]    Report Status PENDING  Incomplete  Urine culture      Status: Abnormal   Collection Time: 12-04-17 11:21 AM  Result Value Ref Range Status   Specimen Description   Final    URINE, CLEAN CATCH Performed at [facility], [address] [place], [place] [address]    Special Requests   Final    NONE Performed at [facility], [address], [place], [place] [address]    Culture 20,000 COLONIES/mL YEAST (A)  Final   Report Status 11/17/2017 FINAL  Final  MRSA PCR Screening     Status: None   Collection Time: 2017/12/04  5:04 PM  Result Value Ref Range Status   MRSA by PCR NEGATIVE NEGATIVE Final    Comment:        The GeneXpert MRSA Assay (FDA approved for NASAL specimens only), is one component of a comprehensive MRSA colonization surveillance program. It is not intended to diagnose MRSA infection nor to guide or monitor treatment for MRSA infections. Performed at [facility], [address], [place], [place] [address]    Studies/Results: Dg Chest Port 1 View  Result Date: 11/17/2017 CLINICAL DATA:  67 year old female with respiratory failure. Suspected aspiration pneumonia. EXAM: PORTABLE CHEST 1 VIEW COMPARISON:  10/21/2017 and earlier. FINDINGS: Portable AP semi upright view at 0859 hours. Endotracheal tube tip in good position between the level the clavicles and carina. Enteric tube courses to the left abdomen, side hole is at the  level of the gastric cardia. Continued subtotal opacification of the left hemithorax, mildly worsened since yesterday. Some air bronchograms are visible about the left hilum. Again evidence of volume loss in the left lung with leftward shift of the mediastinum. The right lung appears stable and clear. Paucity of bowel gas. Stable cholecystectomy clips. No acute osseous abnormality identified. IMPRESSION: 1.  Stable lines and tubes. 2. Mixed volume loss and consolidation suspected throughout the left lung with mild progression since yesterday. Electronically Signed   By: Odessa Fleming M.D.   On: 11/17/2017 09:21     Medications:  Prior to Admission:  Medications Prior to Admission  Medication Sig Dispense Refill Last Dose  . albuterol (PROVENTIL) (2.5 MG/3ML) 0.083% nebulizer solution Take 2.5 mg by nebulization 4 (four) times daily as needed for wheezing or shortness of breath.   11/14/2017 at Unknown time  . ALPRAZolam (XANAX XR) 0.5 MG 24 hr tablet Take 1 tablet (0.5 mg total) by mouth at bedtime as needed for sleep. 30 tablet 0 Past Week at Unknown time  . baclofen (LIORESAL) 20 MG tablet Take 40 mg by mouth 3 (three) times daily.    11/18/2017 at Unknown time  . BELSOMRA 20 MG TABS Take 0.5 tablets by mouth daily.    11/14/2017 at Unknown time  . diazepam (VALIUM) 10 MG tablet Take 10 mg by mouth daily as needed for anxiety.    unknown  . gabapentin (NEURONTIN) 600 MG tablet Take 600 mg by mouth 3 (three) times daily.   11/09/2017 at Unknown time  . Multiple Vitamin (MULTIVITAMIN WITH MINERALS) TABS tablet Take 1 tablet by mouth daily.   10/26/2017 at Unknown time  . potassium chloride (K-DUR,KLOR-CON) 10 MEQ tablet Take 10 mEq by mouth daily.   11/06/2017 at Unknown time  . saccharomyces boulardii (FLORASTOR) 250 MG capsule Take 1 capsule (250 mg total) by mouth 2 (two) times daily. 60 capsule 1 11/14/2017 at Unknown time  . Suvorexant (BELSOMRA) 20 MG TABS Take 0.5 tablets by mouth at bedtime. 30 tablet 0 11/14/2017 at Unknown time  . temazepam (RESTORIL) 30 MG capsule Take 30 mg by mouth at bedtime as needed for sleep.    11/14/2017 at Unknown time   Scheduled: . gabapentin  300 mg Oral Q8H  . mouth rinse  15 mL Mouth Rinse BID   Continuous: . sodium chloride 10 mL/hr at 11/18/17 0634  . sodium chloride 50 mL/hr at 11/18/17 0850  . famotidine (PEPCID) IV Stopped (11/18/17 2220)  . morphine 4 mg/hr (11/18/17 2207)   ZOX:WRUEAVWUJWJXB (TYLENOL) oral liquid 160 mg/5 mL, atropine, bisacodyl, diphenhydrAMINE, fentaNYL, LORazepam, polyvinyl alcohol  Assesment: She was admitted with aspiration  pneumonia and systemic inflammatory response syndrome.  She was intubated on admission.  She has previously expressed a desire not to be placed on mechanical ventilation but she was brought to the hospital by ambulance and was intubated essentially immediately before her husband could arrive.  We did extubate her about 48 hours ago.  She has deteriorated since that point with anticipated hospital death.  Full comfort care now. Principal Problem:   Acute respiratory failure with hypoxia (HCC) Active Problems:   Quadriplegia (HCC)   Spinal cord injury, C5-C7 (HCC)   Protein-calorie malnutrition, severe (HCC)   Aspiration pneumonia (HCC)   Hypoxemia   SIRS (systemic inflammatory response syndrome) (HCC)   DNR (do not resuscitate)   Pressure injury of skin   Palliative care by specialist   Goals of care, counseling/discussion  End of life care    Plan: Full comfort care.  I expect she will die today    LOS: 4 days   Jansen Sciuto L 11/26/2017, 8:06 AM

## 2017-11-20 NOTE — Progress Notes (Signed)
Pharmacy Antibiotic Note  [name] Andersen is a 67 y.o. quadriplegic  female admitted on 11/17/2017 with pneumonia.  Pharmacy has  been re-consulted for Zosyn dosing.   Plan: Continue Zosyn 3.375g IV q8h (ext inf) Pharmacy will continue to monitor labs, cultures and patient progress.  Height:  (165.1 cm) Weight: 84 lb 10.5 oz (38.4 kg) IBW/kg (Calculated) : 57  Temp (24hrs), Avg:97.5 F (36.4 C), Min:96.4 F (35.8 C), Max:98.8 F (37.1 C)  Recent Labs  Lab 11/12/2017 0955 11/06/2017 1002 11/12/2017 1027 10/31/2017 1153 10/25/2017 0649 11/08/2017 2010 11/17/17 0452  WBC 6.0  --   --   --  7.6  --   --   CREATININE <0.30*  --   --   --  <0.30* <0.30* <0.30*  LATICACIDVEN  --  0.80 0.7 0.6  --   --   --     CrClest~ 34  mL/min (rounded Scr to 1.0 for age/quadriplegia)  No Known Allergies   Antimicrobials this admission: Zosyn 5/27 >>  Vanc 5/27 x 1 Rocephin 5/27 x 1 Azith 5/27 x 1    Dose adjustments this admission: n/a  Microbiology results: 5/27 BC x2: NG x4 days  5/27 UCx:  20K CFU yeast  5/27 MRSA PCR: negative   Thank you for allowing pharmacy to be a part of this patient's care.  Tama High 10/22/2017 8:42 AM

## 2017-11-20 NOTE — Progress Notes (Signed)
Patient deceased at 1210. Family at bedside, Dr Juanetta GoslingHawkins notified.

## 2017-11-20 DEATH — deceased
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
## 2023-10-06 ENCOUNTER — Other Ambulatory Visit (HOSPITAL_COMMUNITY): Payer: Self-pay
